# Patient Record
Sex: Female | Born: 1965
Health system: Southern US, Community
[De-identification: ages and names within clinical notes are randomized; demographics above are authoritative.]

## PROBLEM LIST (undated history)

## (undated) DIAGNOSIS — E785 Hyperlipidemia, unspecified: Secondary | ICD-10-CM

## (undated) DIAGNOSIS — I2510 Atherosclerotic heart disease of native coronary artery without angina pectoris: Secondary | ICD-10-CM

## (undated) DIAGNOSIS — K602 Anal fissure, unspecified: Secondary | ICD-10-CM

## (undated) DIAGNOSIS — I10 Essential (primary) hypertension: Secondary | ICD-10-CM

## (undated) DIAGNOSIS — D649 Anemia, unspecified: Secondary | ICD-10-CM

## (undated) HISTORY — DX: Anal fissure, unspecified: K60.2

## (undated) HISTORY — DX: Atherosclerotic heart disease of native coronary artery without angina pectoris: I25.10

## (undated) HISTORY — DX: Anemia, unspecified: D64.9

## (undated) HISTORY — PX: LEG SURGERY: SHX1003

---

## 2003-07-17 ENCOUNTER — Inpatient Hospital Stay (HOSPITAL_COMMUNITY): Admission: EM | Admit: 2003-07-17 | Discharge: 2003-07-19 | Payer: Self-pay | Admitting: Emergency Medicine

## 2003-08-30 ENCOUNTER — Encounter: Admission: RE | Admit: 2003-08-30 | Discharge: 2003-08-30 | Payer: Self-pay | Admitting: Orthopedic Surgery

## 2003-12-29 ENCOUNTER — Other Ambulatory Visit: Admission: RE | Admit: 2003-12-29 | Discharge: 2003-12-29 | Payer: Self-pay | Admitting: Obstetrics and Gynecology

## 2004-03-30 ENCOUNTER — Ambulatory Visit (HOSPITAL_COMMUNITY): Admission: RE | Admit: 2004-03-30 | Discharge: 2004-03-30 | Payer: Self-pay | Admitting: Orthopedic Surgery

## 2008-11-22 ENCOUNTER — Other Ambulatory Visit: Admission: RE | Admit: 2008-11-22 | Discharge: 2008-11-22 | Payer: Self-pay | Admitting: Family Medicine

## 2008-11-29 ENCOUNTER — Encounter: Admission: RE | Admit: 2008-11-29 | Discharge: 2008-11-29 | Payer: Self-pay | Admitting: Family Medicine
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
## 2010-08-25 NOTE — H&P (Signed)
NAME:  Robin, Walsh NO.:  [id number]   MEDICAL RECORD NO.:  [id number]          PATIENT TYPE:  OIB   LOCATION:  2550                         FACILITY:  [facility]   PHYSICIAN:  Myrtie Neither, MD      DATE OF BIRTH:  June 18, 1965   DATE OF ADMISSION:  03/30/2004  DATE OF DISCHARGE:  03/30/2004                                HISTORY & PHYSICAL   CHIEF COMPLAINT:  Painful hardware, left tibia.   HISTORY OF PRESENT ILLNESS:  This is a 45 year old female who sustained  tibial plateau fracture and underwent open reduction internal fixation with  a T-plate application for her tibial plateau fracture.  Patient's fracture  healed quite well.  Patient complained of bumping the screws up against  objects just palpable underneath the skin just below the knee.   PAST MEDICAL HISTORY:  Open reduction internal fixation, left tibia.  No  history of high blood pressure, diabetes.   ALLERGIES:  VICODIN, CODEINE AND PENICILLIN.   MEDICATIONS:  Arutis p.r.n.   SOCIAL HISTORY:  The patient smokes less than a pack per day times 15 years.  Denies use of alcohol.   FAMILY HISTORY:  Noncontributory.   REVIEW OF SYSTEMS:  That of history of present illness, no cardiac,  respiratory, no urinary or bowel symptoms.   PHYSICAL EXAMINATION:  GENERAL:  Alert and oriented, in no acute distress.  VITAL SIGNS:  Temperature 97.5, pulse 60, respiratory rate 16, blood  pressure 110/78, height 64 inches, weight 167.  HEENT:  Normocephalic.  Eye:  Conjunctiva, sclera clear.  Neck supple.  CHEST:  Clear.  CARDIAC:  S1, S2 regular.  EXTREMITIES:  Left lower leg old surgical scar with palpable screw, proximal  tibial plateau area laterally.  Range of motion of left knee is full, good  and stable.  No effusion.   IMPRESSION:  Painful hardware left tibial plateau.   PLAN:  Removal of hardware, left tibial plateau.    AC/MEDQ  D:  03/30/2004  T:  03/31/2004  Job:  [id number]

## 2010-08-25 NOTE — Op Note (Signed)
NAME:  Robin Walsh, Robin Walsh NO.:  [id number]   MEDICAL RECORD NO.:  [id number]                   PATIENT TYPE:  INP   LOCATION:  1829                                 FACILITY:  [facility]   PHYSICIAN:  Myrtie Neither, M.D.                 DATE OF BIRTH:  [birth year]   DATE OF PROCEDURE:  07/17/2003  DATE OF DISCHARGE:                                 OPERATIVE REPORT   PREOPERATIVE DIAGNOSIS:  Displaced lateral tibial plateau fracture.   POSTOPERATIVE DIAGNOSIS:  Displaced lateral tibial plateau fracture.   PROCEDURES:  Open reduction and internal fixation with buttress plate of  left lateral tibial plateau.   ANESTHESIA:  General.   SURGEON:  Myrtie Neither, M.D.   DESCRIPTION OF PROCEDURE:  The patient was taken to the operating room.  After being given adequate preoperative medications, she was given general  anesthesia and intubated.  The left lower extremity was prepped with  Duraprep and draped in a sterile manner.  Tourniquet used for hemostasis.  A  hockey stick incision was made over the left knee of the lateral compartment  going through the skin and subcutaneous tissue.  Sharp and blunt dissection  made down through the fascia.  The muscle was subperiosteally elevated from  the fracture site and the tibia.  Identification of the fracture site was  made.  Anterior wedge section of the plateau had been depressed and  displaced with the use of a freer.  The articular surface was pushed back up  and the fracture fragment itself was reduced.  This was held in place with a  K-wire.  Next a seven-hole buttress plate was placed, stabilizing the  fracture site with the use of six screws.  Anatomic reduction was possible.  Copious irrigation was then done followed by wound closure with use of 0  Vicryl for the fascia, 2-0 Vicryl for the subcutaneous tissues, Dermalon was  used for subcuticular, and closure of the skin with Steri-Strips.  A  compressive dressing  was applied.  Knee immobilizer applied.  The patient  tolerated the procedure quite well and went to recovery in satisfactory and  stable condition.                                               Myrtie Neither, M.D.    AC/MEDQ  D:  07/18/2003  T:  07/18/2003  Job:  [id number]

## 2010-08-25 NOTE — Op Note (Signed)
NAME:  Robin Walsh, Robin Walsh NO.:  [id number]   MEDICAL RECORD NO.:  [id number]          PATIENT TYPE:  OIB   LOCATION:  2550                         FACILITY:  [facility]   PHYSICIAN:  Myrtie Neither, MD      DATE OF BIRTH:  [birth year]   DATE OF PROCEDURE:  03/30/2004  DATE OF DISCHARGE:  03/30/2004                                 OPERATIVE REPORT   PREOPERATIVE DIAGNOSIS:  Painful hardware left tibia.   POSTOPERATIVE DIAGNOSIS:  Painful hardware left tibia.   OPERATION PERFORMED:  Removal of hardware, left tibia.   SURGEON:  Myrtie Neither, MD   ANESTHESIA:  General.   DESCRIPTION OF PROCEDURE:  The patient was taken to the operating room after  given adequate preoperative medications and given general anesthesia and  intubated.  The left lower leg was prepped with DuraPrep and draped in  sterile manner, tourniquet and Bovie used for hemostasis.  A curvilinear  incision was made through the previous scar going through the skin and  subcutaneous tissue, sharp and blunt dissection was down to the tibial  plate.  Six screws were removed and the plate was removed.  Irrigation was  then done.  Scar resection was done, followed by 2-0 Vicryl for the  subcutaneous tissue and #4-0 Dermalon suture for subcuticular closure.  Steri-Strips were applied.  Compressive dressing was applied.  Immobilizer  well.  The patient tolerated the procedure quite well and went to recovery  room in stable and satisfactory condition.      AC/MEDQ  D:  03/30/2004  T:  03/31/2004  Job:  [id number]

## 2015-11-26 ENCOUNTER — Emergency Department (HOSPITAL_COMMUNITY): Payer: Self-pay

## 2015-11-26 ENCOUNTER — Inpatient Hospital Stay (HOSPITAL_COMMUNITY)
Admission: EM | Admit: 2015-11-26 | Discharge: 2015-11-29 | DRG: 247 | Disposition: A | Discharge status: Home / Self Care | Payer: Self-pay | Attending: Internal Medicine | Admitting: Internal Medicine

## 2015-11-26 ENCOUNTER — Encounter (HOSPITAL_COMMUNITY): Payer: Self-pay

## 2015-11-26 DIAGNOSIS — I10 Essential (primary) hypertension: Secondary | ICD-10-CM | POA: Diagnosis present

## 2015-11-26 DIAGNOSIS — R079 Chest pain, unspecified: Secondary | ICD-10-CM

## 2015-11-26 DIAGNOSIS — Z91199 Patient's noncompliance with other medical treatment and regimen due to unspecified reason: Secondary | ICD-10-CM

## 2015-11-26 DIAGNOSIS — E781 Pure hyperglyceridemia: Secondary | ICD-10-CM | POA: Diagnosis present

## 2015-11-26 DIAGNOSIS — I25110 Atherosclerotic heart disease of native coronary artery with unstable angina pectoris: Principal | ICD-10-CM | POA: Diagnosis present

## 2015-11-26 DIAGNOSIS — F17200 Nicotine dependence, unspecified, uncomplicated: Secondary | ICD-10-CM | POA: Diagnosis present

## 2015-11-26 DIAGNOSIS — Z9861 Coronary angioplasty status: Secondary | ICD-10-CM

## 2015-11-26 DIAGNOSIS — I739 Peripheral vascular disease, unspecified: Secondary | ICD-10-CM | POA: Diagnosis present

## 2015-11-26 DIAGNOSIS — F17210 Nicotine dependence, cigarettes, uncomplicated: Secondary | ICD-10-CM | POA: Diagnosis present

## 2015-11-26 DIAGNOSIS — I200 Unstable angina: Secondary | ICD-10-CM | POA: Diagnosis present

## 2015-11-26 DIAGNOSIS — I2510 Atherosclerotic heart disease of native coronary artery without angina pectoris: Secondary | ICD-10-CM

## 2015-11-26 DIAGNOSIS — R9431 Abnormal electrocardiogram [ECG] [EKG]: Secondary | ICD-10-CM

## 2015-11-26 DIAGNOSIS — Z79899 Other long term (current) drug therapy: Secondary | ICD-10-CM

## 2015-11-26 DIAGNOSIS — M25551 Pain in right hip: Secondary | ICD-10-CM | POA: Diagnosis present

## 2015-11-26 DIAGNOSIS — E785 Hyperlipidemia, unspecified: Secondary | ICD-10-CM | POA: Diagnosis present

## 2015-11-26 DIAGNOSIS — R0789 Other chest pain: Secondary | ICD-10-CM

## 2015-11-26 DIAGNOSIS — Z9119 Patient's noncompliance with other medical treatment and regimen: Secondary | ICD-10-CM

## 2015-11-26 DIAGNOSIS — Z7951 Long term (current) use of inhaled steroids: Secondary | ICD-10-CM

## 2015-11-26 DIAGNOSIS — Z6827 Body mass index (BMI) 27.0-27.9, adult: Secondary | ICD-10-CM

## 2015-11-26 DIAGNOSIS — F1220 Cannabis dependence, uncomplicated: Secondary | ICD-10-CM | POA: Diagnosis present

## 2015-11-26 HISTORY — DX: Essential (primary) hypertension: I10

## 2015-11-26 HISTORY — DX: Hyperlipidemia, unspecified: E78.5

## 2015-11-26 LAB — BASIC METABOLIC PANEL
ANION GAP: 8 (ref 5–15)
BUN: <5 mg/dL — ABNORMAL LOW (ref 6–20)
CO2: 26 mmol/L (ref 22–32)
Calcium: 9.8 mg/dL (ref 8.9–10.3)
Chloride: 102 mmol/L (ref 101–111)
Creatinine, Ser: 0.99 mg/dL (ref 0.44–1.00)
GFR calc non Af Amer: >60 mL/min (ref >60)
Glucose, Bld: 105 mg/dL — ABNORMAL HIGH (ref 65–99)
Potassium: 3.6 mmol/L (ref 3.5–5.1)
Sodium: 136 mmol/L (ref 135–145)

## 2015-11-26 LAB — I-STAT TROPONIN, ED: Troponin i, poc: 0.03 ng/mL (ref 0.00–0.08)

## 2015-11-26 LAB — CBC
HCT: 43.5 % (ref 36.0–46.0)
Hemoglobin: 14.3 g/dL (ref 12.0–15.0)
MCH: 32.3 pg (ref 26.0–34.0)
MCHC: 32.9 g/dL (ref 30.0–36.0)
MCV: 98.2 fL (ref 78.0–100.0)
Platelets: 320 10*3/uL (ref 150–400)
RBC: 4.43 MIL/uL (ref 3.87–5.11)
RDW: 12.3 % (ref 11.5–15.5)
WBC: 10.2 10*3/uL (ref 4.0–10.5)

## 2015-11-26 NOTE — ED Notes (Signed)
Pt. Currently at radiology for chest x-ray

## 2015-11-26 NOTE — ED Provider Notes (Signed)
[facility] DEPT Provider Note   CSN: [id number] Arrival date & time: 11/26/15  2145     History   Chief Complaint Chief Complaint  Patient presents with  . Chest Pain    HPI Robin Walsh is a 50 y.o. female.  Robin Walsh is a 50 y.o. female  with a hx of HTN (unmedicated), smoking 1ppd presents to the Emergency Department complaining of intermittent chest pressure and burning.  Pt has associated jaw pain, bilateral arm pain, diaphoresis.  She reports that 1 month ago she had 1 full week of symptoms that occurred approx 2x per day.   She does not believe they were changed by exertion.  She reports ssx returned 3 days ago.  Her last full episode was at 10:30 this morning, but she has had some chest/back burning throughout the day. She denies a personal cardiac hx.  She reports a hx of CAD, CABG and CHF in her mother. Pt denies any previous cardiac work-up.  No known aggravating or alleviating symptoms.  She reports she is anxious about this.  Pt denies fever, chills, headache, abdominal pain, nausea, vomiting, diarrhea, weakness, dizziness, syncope.      The history is provided by the patient and medical records. No language interpreter was used.    Past Medical History:  Diagnosis Date  . Hypertension     Patient Active Problem List   Diagnosis Date Noted  . Chest pain 11/27/2015    Past Surgical History:  Procedure Laterality Date  . LEG SURGERY      OB History    No data available       Home Medications    Prior to Admission medications   Medication Sig Start Date End Date Taking? Authorizing Provider  albuterol (PROVENTIL HFA;VENTOLIN HFA) 108 (90 Base) MCG/ACT inhaler Inhale 1-2 puffs into the lungs every 6 (six) hours as needed for wheezing or shortness of breath.   Yes Historical Provider, MD  ibuprofen (ADVIL,MOTRIN) 200 MG tablet Take 200-400 mg by mouth every 6 (six) hours as needed for moderate pain.   Yes Historical Provider, MD    Family  History History reviewed. No pertinent family history.  Social History Social History  Substance Use Topics  . Smoking status: Current Every Day Smoker    Packs/day: 1.00    Types: Cigarettes  . Smokeless tobacco: Never Used  . Alcohol use No     Allergies   Codeine; Dilaudid [hydromorphone hcl]; and Penicillins   Review of Systems Review of Systems  Constitutional: Positive for diaphoresis.  Respiratory: Positive for chest tightness.   Cardiovascular: Positive for chest pain.  Gastrointestinal: Negative for abdominal pain, nausea and vomiting.  Skin: Negative for rash.  Neurological: Negative for syncope and numbness.  All other systems reviewed and are negative.    Physical Exam Updated Vital Signs BP 136/83   Pulse 78   Temp 97.9 F (36.6 C) (Oral)   Resp 21   Ht 5\' 4"  (1.626 m)   Wt 73.3 kg   LMP 08/26/2015   SpO2 96%   BMI 27.73 kg/m   Physical Exam  Constitutional: She appears well-developed and well-nourished. No distress.  Awake, alert, nontoxic appearance  HENT:  Head: Normocephalic and atraumatic.  Mouth/Throat: Oropharynx is clear and moist. No oropharyngeal exudate.  Eyes: Conjunctivae are normal. No scleral icterus.  Neck: Normal range of motion. Neck supple. No spinous process tenderness and no muscular tenderness present.  Cardiovascular: Normal rate, regular rhythm and intact distal  pulses.   Pulmonary/Chest: Effort normal and breath sounds normal. No respiratory distress. She has no wheezes.  Equal chest expansion  Abdominal: Soft. Bowel sounds are normal. She exhibits no mass. There is no tenderness. There is no rebound and no guarding.  Musculoskeletal: Normal range of motion. She exhibits no edema.  Neurological: She is alert.  Speech is clear and goal oriented Moves extremities without ataxia  Skin: Skin is warm and dry. She is not diaphoretic.  Psychiatric: Her mood appears anxious.  Nursing note and vitals reviewed.    ED  Treatments / Results  Labs (all labs ordered are listed, but only abnormal results are displayed) Labs Reviewed  BASIC METABOLIC PANEL - Abnormal; Notable for the following:       Result Value   Glucose, Bld 105 (*)    BUN <5 (*)    All other components within normal limits  CBC  I-STAT TROPOININ, ED    EKG  EKG Interpretation  Date/Time:  Saturday November 26 2015 22:09:40 EDT Ventricular Rate:  88 PR Interval:  132 QRS Duration: 76 QT Interval:  366 QTC Calculation: 442 R Axis:   60 Text Interpretation:  Normal sinus rhythm T wave abnormality, consider anterior ischemia Abnormal ECG No significant change since last tracing Abnormal ekg Confirmed by Carmin Muskrat  MD [phone]) on 11/26/2015 10:45:18 PM       Radiology Dg Chest 2 View  Result Date: 11/26/2015 CLINICAL DATA:  Burning sensation in the chest. EXAM: CHEST  2 VIEW COMPARISON:  03/28/2004 FINDINGS: Normal heart size and mediastinal contours. No infiltrate or edema. No effusion or pneumothorax. No acute osseous findings. IMPRESSION: Negative chest. Electronically Signed   By: Monte Fantasia M.D.   On: 11/26/2015 22:46    Procedures Procedures (including critical care time)  Medications Ordered in ED Medications - No data to display   Initial Impression / Assessment and Plan / ED Course  I have reviewed the triage vital signs and the nursing notes.  Pertinent labs & imaging results that were available during my care of the patient were reviewed by me and considered in my medical decision making (see chart for details).  Clinical Course  Value Comment By Time   Heart Score 4 Abigail Butts, PA-C 08/19 2306  DG Chest 2 View Neg [place], [place] 08/19 2307  ED EKG within 10 minutes T wave inversion I, avL, V1-V4 Abigail Butts, PA-C 08/19 2308  Troponin i, poc: 0.03 Neg Sariyah Corcino, PA-C 08/19 2309  BP: (!) 167/104 HTN - pt very anxious at the time Abigail Butts, PA-C 08/19 2309    Patient is pain-free at this time. Long discussion about risks versus benefit of leaving. Patient is willing to stay for further cardiac workup. Jarrett Soho Larue Lightner, PA-C 08/20 0021  WBC: 10.2 No leukocytosis or anemia Abigail Butts, PA-C 08/20 0021  Sodium: 136 Electrolytes reassuring Abigail Butts, PA-C 08/20 0021   Discussed with Dr. Lorin Mercy who will admit to tele obs Abigail Butts, PA-C 08/20 0103    Concern for cardiac etiology of Chest Pain. Pt has been re-evaluated prior to consult and VSS, NAD, heart RRR, pain 0/10, lungs CTAB. First round of cardiac enzymes negative. Pt has never had any provocative testing.  This case was discussed with Dr. Vanita Panda who has seen the patient and agrees with plan to admit.    Final Clinical Impressions(s) / ED Diagnoses   Final diagnoses:  Chest pain, unspecified chest pain type    New Prescriptions New  Prescriptions   No medications on file     Abigail Butts, PA-C 11/27/15 0104    Carmin Muskrat, MD 12/05/15 1002

## 2015-11-26 NOTE — ED Notes (Signed)
Dr. Vanita Panda at bedside evaluating pt. and explaining plan of care to pt.

## 2015-11-26 NOTE — ED Notes (Signed)
Per Charlett Nose, EMT Dr. Tyrone Nine wants repeat EKG in 10 minutes.

## 2015-11-26 NOTE — ED Triage Notes (Signed)
Onset 2-3 days burning sensation in between shoulder blades and sometimes radiates down both arms, chest has burning sensation.  Pt breaks out in hot sweat from head to toe.  Lasts 10 minutes x twice this morning.  Mild chest pressure since this morning.

## 2015-11-27 ENCOUNTER — Observation Stay (HOSPITAL_COMMUNITY): Payer: Self-pay

## 2015-11-27 ENCOUNTER — Encounter (HOSPITAL_COMMUNITY): Payer: Self-pay | Admitting: Internal Medicine

## 2015-11-27 DIAGNOSIS — I200 Unstable angina: Secondary | ICD-10-CM | POA: Diagnosis present

## 2015-11-27 DIAGNOSIS — I10 Essential (primary) hypertension: Secondary | ICD-10-CM | POA: Diagnosis present

## 2015-11-27 DIAGNOSIS — Z9119 Patient's noncompliance with other medical treatment and regimen: Secondary | ICD-10-CM

## 2015-11-27 DIAGNOSIS — F17200 Nicotine dependence, unspecified, uncomplicated: Secondary | ICD-10-CM

## 2015-11-27 DIAGNOSIS — F1220 Cannabis dependence, uncomplicated: Secondary | ICD-10-CM

## 2015-11-27 DIAGNOSIS — I214 Non-ST elevation (NSTEMI) myocardial infarction: Secondary | ICD-10-CM

## 2015-11-27 DIAGNOSIS — E785 Hyperlipidemia, unspecified: Secondary | ICD-10-CM

## 2015-11-27 DIAGNOSIS — M25551 Pain in right hip: Secondary | ICD-10-CM

## 2015-11-27 DIAGNOSIS — Z91199 Patient's noncompliance with other medical treatment and regimen due to unspecified reason: Secondary | ICD-10-CM

## 2015-11-27 DIAGNOSIS — R072 Precordial pain: Secondary | ICD-10-CM

## 2015-11-27 LAB — RAPID URINE DRUG SCREEN, HOSP PERFORMED
Amphetamines: NOT DETECTED
Barbiturates: NOT DETECTED
Benzodiazepines: NOT DETECTED
Cocaine: NOT DETECTED
OPIATES: NOT DETECTED
Tetrahydrocannabinol: POSITIVE — AB

## 2015-11-27 LAB — TSH: TSH: 2.32 u[IU]/mL (ref 0.350–4.500)

## 2015-11-27 LAB — PROTIME-INR
INR: 1.14
Prothrombin Time: 14.7 seconds (ref 11.4–15.2)

## 2015-11-27 LAB — TROPONIN I
TROPONIN I: 0.03 ng/mL — AB (ref <0.03)
Troponin I: 0.04 ng/mL (ref <0.03)
Troponin I: 0.04 ng/mL (ref <0.03)

## 2015-11-27 LAB — LIPID PANEL
CHOL/HDL RATIO: 7.8 ratio
Cholesterol: 218 mg/dL — ABNORMAL HIGH (ref 0–200)
HDL: 28 mg/dL — AB (ref >40)
LDL CALC: 124 mg/dL — AB (ref 0–99)
Triglycerides: 331 mg/dL — ABNORMAL HIGH (ref <150)
VLDL: 66 mg/dL — AB (ref 0–40)

## 2015-11-27 LAB — HEPARIN LEVEL (UNFRACTIONATED): Heparin Unfractionated: <0.1 IU/mL — ABNORMAL LOW (ref 0.30–0.70)

## 2015-11-27 MED ORDER — SODIUM CHLORIDE 0.9 % IV SOLN: 250.0000 mL | INTRAVENOUS | Status: DC | PRN

## 2015-11-27 MED ORDER — HEPARIN BOLUS VIA INFUSION
2000.0000 [IU] | Freq: Once | INTRAVENOUS | Status: AC
  Administered 2015-11-27: 2000 [IU] via INTRAVENOUS
  Filled 2015-11-27: qty 2000

## 2015-11-27 MED ORDER — ASPIRIN 81 MG PO CHEW
81.0000 mg | CHEWABLE_TABLET | ORAL | Status: AC
  Administered 2015-11-28: 81 mg via ORAL
  Filled 2015-11-27: qty 1

## 2015-11-27 MED ORDER — GI COCKTAIL ~~LOC~~: 30.0000 mL | Freq: Four times a day (QID) | ORAL | Status: DC | PRN

## 2015-11-27 MED ORDER — SODIUM CHLORIDE 0.9% FLUSH: 3.0000 mL | INTRAVENOUS | Status: DC | PRN

## 2015-11-27 MED ORDER — HEPARIN (PORCINE) IN NACL 100-0.45 UNIT/ML-% IJ SOLN
1250.0000 [IU]/h | INTRAMUSCULAR | Status: DC
  Administered 2015-11-27: 850 [IU]/h via INTRAVENOUS
  Administered 2015-11-28: 1250 [IU]/h via INTRAVENOUS
  Filled 2015-11-27 (×2): qty 250

## 2015-11-27 MED ORDER — ACETAMINOPHEN 325 MG PO TABS
650.0000 mg | ORAL_TABLET | ORAL | Status: DC | PRN
  Administered 2015-11-27: 650 mg via ORAL

## 2015-11-27 MED ORDER — LACTATED RINGERS IV SOLN
INTRAVENOUS | Status: DC
  Administered 2015-11-27 (×2): via INTRAVENOUS

## 2015-11-27 MED ORDER — ACETAMINOPHEN 325 MG PO TABS
ORAL_TABLET | ORAL | Status: AC
  Filled 2015-11-27: qty 2

## 2015-11-27 MED ORDER — REGADENOSON 0.4 MG/5ML IV SOLN
INTRAVENOUS | Status: AC
  Filled 2015-11-27: qty 5

## 2015-11-27 MED ORDER — ONDANSETRON HCL 4 MG/2ML IJ SOLN: 4.0000 mg | Freq: Four times a day (QID) | INTRAMUSCULAR | Status: DC | PRN

## 2015-11-27 MED ORDER — ASPIRIN EC 325 MG PO TBEC
325.0000 mg | DELAYED_RELEASE_TABLET | Freq: Every day | ORAL | Status: DC
  Administered 2015-11-27: 325 mg via ORAL
  Filled 2015-11-27 (×3): qty 1

## 2015-11-27 MED ORDER — SODIUM CHLORIDE 0.9 % WEIGHT BASED INFUSION: 1.0000 mL/kg/h | INTRAVENOUS | Status: DC

## 2015-11-27 MED ORDER — MORPHINE SULFATE (PF) 2 MG/ML IV SOLN: 2.0000 mg | INTRAVENOUS | Status: DC | PRN

## 2015-11-27 MED ORDER — HYDRALAZINE HCL 20 MG/ML IJ SOLN: 10.0000 mg | INTRAMUSCULAR | Status: DC | PRN

## 2015-11-27 MED ORDER — SODIUM CHLORIDE 0.9% FLUSH
3.0000 mL | Freq: Two times a day (BID) | INTRAVENOUS | Status: DC
  Administered 2015-11-27: 3 mL via INTRAVENOUS

## 2015-11-27 MED ORDER — ATORVASTATIN CALCIUM 40 MG PO TABS
40.0000 mg | ORAL_TABLET | Freq: Every day | ORAL | Status: DC
  Administered 2015-11-27 – 2015-11-28 (×2): 40 mg via ORAL
  Filled 2015-11-27 (×2): qty 1

## 2015-11-27 MED ORDER — HYDRALAZINE HCL 20 MG/ML IJ SOLN
10.0000 mg | INTRAMUSCULAR | Status: DC | PRN
  Administered 2015-11-27: 10 mg via INTRAVENOUS
  Filled 2015-11-27: qty 1

## 2015-11-27 MED ORDER — REGADENOSON 0.4 MG/5ML IV SOLN
0.4000 mg | Freq: Once | INTRAVENOUS | Status: DC
  Filled 2015-11-27: qty 5

## 2015-11-27 MED ORDER — ENOXAPARIN SODIUM 40 MG/0.4ML ~~LOC~~ SOLN
40.0000 mg | SUBCUTANEOUS | Status: DC
  Filled 2015-11-27: qty 0.4

## 2015-11-27 MED ORDER — METOPROLOL TARTRATE 25 MG PO TABS
25.0000 mg | ORAL_TABLET | Freq: Two times a day (BID) | ORAL | Status: DC
  Administered 2015-11-27 – 2015-11-28 (×4): 25 mg via ORAL
  Filled 2015-11-27 (×3): qty 1

## 2015-11-27 MED ORDER — ZOLPIDEM TARTRATE 5 MG PO TABS
5.0000 mg | ORAL_TABLET | Freq: Every evening | ORAL | Status: DC | PRN
  Administered 2015-11-27: 5 mg via ORAL
  Filled 2015-11-27: qty 1

## 2015-11-27 MED ORDER — TECHNETIUM TC 99M TETROFOSMIN IV KIT
10.0000 | PACK | Freq: Once | INTRAVENOUS | Status: AC | PRN
  Administered 2015-11-27: 10 via INTRAVENOUS

## 2015-11-27 MED ORDER — HEPARIN BOLUS VIA INFUSION
4000.0000 [IU] | Freq: Once | INTRAVENOUS | Status: AC
  Administered 2015-11-27: 4000 [IU] via INTRAVENOUS
  Filled 2015-11-27: qty 4000

## 2015-11-27 MED ORDER — CLONAZEPAM 0.5 MG PO TABS
0.5000 mg | ORAL_TABLET | Freq: Once | ORAL | Status: AC
Start: 2015-11-27 — End: 2015-11-27
  Administered 2015-11-27: 0.5 mg via ORAL
  Filled 2015-11-27: qty 1

## 2015-11-27 MED ORDER — SODIUM CHLORIDE 0.9 % WEIGHT BASED INFUSION
3.0000 mL/kg/h | INTRAVENOUS | Status: DC
  Administered 2015-11-28: 3 mL/kg/h via INTRAVENOUS

## 2015-11-27 NOTE — H&P (Signed)
History and Physical    Robin Walsh [id number] DOB: April 27, 1965 DOA: 11/26/2015  PCP: No primary care provider on file. Consultants:  None Patient coming from: home - lives with boyfriend; Donald Prose; Charisse March, [phone]  Chief Complaint:  Chest pain  HPI: Robin Walsh is a 50 y.o. female with medical history significant of HTN and HLD which are not treated because the patient has not seen a physician in years as well as tobacco dependence presenting with chest pain.  Patient reports that end of June had some chest pain for the first time.  Very intermittent.  Passed gas and would feel better.  ?GERD or something similar.  Had an episode at work last Thursday - burning on shoulder blade and checked blood pressure and diastolic was [id number].  2 episodes this AM.  Feels it coming on, burning between shoulder blades or in throat.  Improves with taking deep breath or opening up chest.  Sometimes radiates down one arm or the other.  Sometimes feels it in her jaw.  Feels tightness in her chest during episodes, "almost like a bronchitis tightness" but able to breathe fine.  Substernal.  Noin-exertional.  +diaphoretic.  No nausea.   Feels mild chest tightness with light sweat right now.     Has never had a stress test.  Has not sen a doctor in years, last mammogram 7 years ago and that was the last time she saw any doctor.  Was prescribed HCTZ 12.5 mg and a statin years ago.  Currently with hot flash.  Reports missing about 1 period every 1-2 years.  Has not had a period in the last few months.  Patient also describes difficulty walking distances due to R hip/groin pain.  She does not have this pain any other time, but when she walks distances it develops and it causes her to stop and rest.  The pain resolves completely with rest and then resumes when she starts walking again.   ED Course:  Per PA-C Muthersbaugh: Concern for cardiac etiology of Chest Pain. Pt has been re-evaluated prior to  consult and VSS, NAD, heart RRR, pain 0/10, lungs CTAB. First round of cardiac enzymes negative. Pt has never had any provocative testing.  This case was discussed with Dr. Vanita Panda who has seen the patient and agrees with plan to admit.   Review of Systems: As per HPI; otherwise 10 point review of systems reviewed and negative.   Ambulatory Status: ambulates without assistance  Past Medical History:  Diagnosis Date  . Hyperlipidemia   . Hypertension     Past Surgical History:  Procedure Laterality Date  . LEG SURGERY  2002?   tibial plateau fracture    Social History   Social History  . Marital status: Married    Spouse name: N/A  . Number of children: N/A  . Years of education: N/A   Occupational History  . clerical work    Social History Main Topics  . Smoking status: Current Every Day Smoker    Packs/day: 1.00    Years: 25.00    Types: Cigarettes  . Smokeless tobacco: Never Used  . Alcohol use No  . Drug use:     Types: Marijuana     Comment: nightly  . Sexual activity: Not on file   Other Topics Concern  . Not on file   Social History Narrative  . No narrative on file    Allergies  Allergen Reactions  . Codeine Nausea And Vomiting  .  Dilaudid [Hydromorphone Hcl] Nausea And Vomiting  . Penicillins Rash    Was told when she was a child that she was allergic.  Can take Amoxicillin.     Family History  Problem Relation Age of Onset  . Congestive Heart Failure Mother 24  . Alcohol abuse Father 71    Prior to Admission medications   Medication Sig Start Date End Date Taking? Authorizing Provider  albuterol (PROVENTIL HFA;VENTOLIN HFA) 108 (90 Base) MCG/ACT inhaler Inhale 1-2 puffs into the lungs every 6 (six) hours as needed for wheezing or shortness of breath.   Yes Historical Provider, MD  ibuprofen (ADVIL,MOTRIN) 200 MG tablet Take 200-400 mg by mouth every 6 (six) hours as needed for moderate pain.   Yes Historical Provider, MD    Physical  Exam: Vitals:   11/27/15 0130 11/27/15 0145 11/27/15 0200 11/27/15 0217  BP: 152/85 154/93 151/82 126/80  Pulse: 84 79 92 72  Resp: 13 15 23 18   Temp:      TempSrc:      SpO2: 99% 97% 98% 99%  Weight:      Height:         General: Appears calm and comfortable and is NAD; appears older than stated age Eyes:  PERRL, EOMI, normal lids, iris ENT:  grossly normal hearing, lips & tongue, mmm; poor dentition Neck:  no LAD, masses or thyromegaly Cardiovascular:  RRR, no m/r/g. No LE edema.  Respiratory:  CTA bilaterally, no w/r/r. Normal respiratory effort. Abdomen:  soft, ntnd, NABS Skin:  no rash or induration seen on limited exam Musculoskeletal:  grossly normal tone BUE/BLE, good ROM, no bony abnormality Psychiatric: mildly anxious, speech fluent and appropriate, AOx3 Neurologic:  CN 2-12 grossly intact, moves all extremities in coordinated fashion, sensation intact  Labs on Admission: I have personally reviewed following labs and imaging studies  CBC:  Recent Labs Lab 11/26/15 2247  WBC 10.2  HGB 14.3  HCT 43.5  MCV 98.2  PLT 99991111   Basic Metabolic Panel:  Recent Labs Lab 11/26/15 2247  NA 136  K 3.6  CL 102  CO2 26  GLUCOSE 105*  BUN <5*  CREATININE 0.99  CALCIUM 9.8   GFR: Estimated Creatinine Clearance: 68.1 mL/min (by C-G formula based on SCr of 0.99 mg/dL). Liver Function Tests: No results for input(s): AST, ALT, ALKPHOS, BILITOT, PROT, ALBUMIN in the last 168 hours. No results for input(s): LIPASE, AMYLASE in the last 168 hours. No results for input(s): AMMONIA in the last 168 hours. Coagulation Profile: No results for input(s): INR, PROTIME in the last 168 hours. Cardiac Enzymes: No results for input(s): CKTOTAL, CKMB, CKMBINDEX, TROPONINI in the last 168 hours. BNP (last 3 results) No results for input(s): PROBNP in the last 8760 hours. HbA1C: No results for input(s): HGBA1C in the last 72 hours. CBG: No results for input(s): GLUCAP in the last  168 hours. Lipid Profile: No results for input(s): CHOL, HDL, LDLCALC, TRIG, CHOLHDL, LDLDIRECT in the last 72 hours. Thyroid Function Tests: No results for input(s): TSH, T4TOTAL, FREET4, T3FREE, THYROIDAB in the last 72 hours. Anemia Panel: No results for input(s): VITAMINB12, FOLATE, FERRITIN, TIBC, IRON, RETICCTPCT in the last 72 hours. Urine analysis: No results found for: COLORURINE, APPEARANCEUR, LABSPEC, PHURINE, GLUCOSEU, HGBUR, BILIRUBINUR, KETONESUR, PROTEINUR, UROBILINOGEN, NITRITE, LEUKOCYTESUR  Creatinine Clearance: Estimated Creatinine Clearance: 68.1 mL/min (by C-G formula based on SCr of 0.99 mg/dL).  Sepsis Labs: @LABRCNTIP (procalcitonin:4,lacticidven:4) )No results found for this or any previous visit (from the past 240 hour(s)).  Radiological Exams on Admission: Dg Chest 2 View  Result Date: 11/26/2015 CLINICAL DATA:  Burning sensation in the chest. EXAM: CHEST  2 VIEW COMPARISON:  03/28/2004 FINDINGS: Normal heart size and mediastinal contours. No infiltrate or edema. No effusion or pneumothorax. No acute osseous findings. IMPRESSION: Negative chest. Electronically Signed   By: Monte Fantasia M.D.   On: 11/26/2015 22:46    EKG: Independently reviewed.  NSR with rate 92; nonspecific ST changes with no evidence of acute ischemia  Assessment/Plan Principal Problem:   Chest pain Active Problems:   Essential hypertension   Hyperlipidemia   Tobacco dependence   Noncompliance   Marijuana dependence ([place])   Right hip pain   Chest pain -Patient with vague chest pressure with radiation that is substernal but nonexertional and relieved spontaneously. -1/3 typical symptoms suggestive of atypical vs. Noncardiac chest pain.  -CXR unremarkable.   -Initial cardiac enzymes negative.   -EKG not indicative of acute ischemia.   -TIMI risk score is 2; which predicts a 14 days risk of death, recurrent MI, or urgent revascularization of 8.3%.  -Will plan to place in  observation status on telemetry to rule out ACS by overnight observation.  -cycle cardiac enzymes q6h x 3 and repeat EKG in AM -ASA 325 mg PO daily -morphine, statin given -Risk factor stratification with FLP and HgbA1c; will also check TSH -MPS in AM -Cardiology consultation in AM - unassigned notified via Inbox message to the [place] for discharge to home tomorrow after stress testing if tests are negative for ischemia  HTN -Patient with reported h/o HTN but has not seen a physician in years.   -BP is markedly abnormal here in ER -For now, will cover with prn IV Hydralazine -Will almost certainly need initiation of new medication(s) at the time of discharge  Tobacco Dependence -Encourage cessation.  This was discussed with the patient and should be reviewed on an ongoing basis.   -Patch declined by patient.  Marijuana Dependence -Encourage cessation. This was discussed with the patient and should be reviewed on an ongoing basis.    Right hip pain -While this pain may be MSK-related, her description of the pain was very consistent with intermittent claudication. -She may benefit from ABIs or other vascular testing as an outpatient.  Medical Non-Compliance -Long discussion with patient regarding her chronic medical problems and lack of follow-up/PCP. -She watched her mother die because of similar life choices and yet she seems to be choosing poorly. -Encouragement provided to start making better choices with regards to smoking, medications, physician follow-up. -Care management consult placed for assistance in locating a PCP.   DVT prophylaxis: Lovenox  Code Status:  Full - confirmed with patient/family Family Communication: None present  Disposition Plan:  Home once clinically improved Consults called: Cardiology, unassigned, via [employer] inbox message  Admission status: Observation, telemetry    Karmen Bongo MD [facility]  If 7PM-7AM, please  contact night-coverage [email or web address] Password [email or web address]  11/27/2015, 2:49 AM

## 2015-11-27 NOTE — Consult Note (Signed)
Cardiology Consult    Patient ID: Robin Walsh MRN: [id number], DOB/AGE: 04-15-1965   Admit date: 11/26/2015 Date of Consult: 11/27/2015  Primary Physician: No primary care provider on file. Reason for Consult: chest pain  Primary Cardiologist: new Requesting Provider: Dr. Allyson Sabal  Patient Profile  Robin Walsh is a 50 year old female with a past medical history of HTN, current tobacco abuse and HLD. She presented to the ED on 11/26/15 with 2-3 day history of a burning sensation between her shoulder blades that sometimes radiates down both arms.   History of Present Illness  Robin Walsh tells me that she began having a interscapular burning sensation that began while she was sitting at work about 2 months ago. She denies associated symptoms of SOB, nausea and diaphoresis. She does say that at times her interscapular pain will radiate to her throat and jaw.   Yesterday morning, she began having chest pain that awakened her from sleep. It was the same interscapular burning with associated throat pain and jaw pain. She did become diaphoretic.   She has not seen a doctor in many years, but does carry a diagnosis of hypertension and hyperlipidemia.   Her EKG shows deep anterior ST depression concerning for a LAD lesion. She has a family history of CAD, her mother had CABG at age 48 and recently passed from heart disease. Patient is a current every day smoker, and says that she eats a very unhealthy diet. Her initial troponin was 0.03, next was 0.04.   She is pain free at the time of my encounter, but is anxious and tearful about having to stay in the hospital.   Past Medical History   Past Medical History:  Diagnosis Date  . Hyperlipidemia   . Hypertension     Past Surgical History:  Procedure Laterality Date  . LEG SURGERY  2002?   tibial plateau fracture     Allergies  Allergies  Allergen Reactions  . Codeine Nausea And Vomiting  . Dilaudid [Hydromorphone Hcl] Nausea And  Vomiting  . Penicillins Rash    Was told when she was a child that she was allergic.  Can take Amoxicillin.     Inpatient Medications    . aspirin EC  325 mg Oral Daily  . atorvastatin  40 mg Oral q1800  . enoxaparin (LOVENOX) injection  40 mg Subcutaneous Q24H    Family History    Family History  Problem Relation Age of Onset  . Congestive Heart Failure Mother 37  . Alcohol abuse Father 25    Social History    Social History   Social History  . Marital status: Married    Spouse name: N/A  . Number of children: N/A  . Years of education: N/A   Occupational History  . clerical work    Social History Main Topics  . Smoking status: Current Every Day Smoker    Packs/day: 1.00    Years: 25.00    Types: Cigarettes  . Smokeless tobacco: Never Used  . Alcohol use No  . Drug use:     Types: Marijuana     Comment: nightly  . Sexual activity: Not on file   Other Topics Concern  . Not on file   Social History Narrative  . No narrative on file     Review of Systems    General:  No chills, fever, night sweats or weight changes.  Cardiovascular: +chest pain, + dyspnea on exertion, edema, orthopnea, palpitations, paroxysmal  nocturnal dyspnea. Dermatological: No rash, lesions/masses Respiratory: No cough, dyspnea Urologic: No hematuria, dysuria Abdominal:   No nausea, vomiting, diarrhea, bright red blood per rectum, melena, or hematemesis Neurologic:  No visual changes, wkns, changes in mental status. All other systems reviewed and are otherwise negative except as noted above.  Physical Exam    Blood pressure (!) 148/90, pulse 91, temperature 98 F (36.7 C), temperature source Oral, resp. rate 14, height 5\' 4"  (1.626 m), weight 157 lb 1.6 oz (71.3 kg), last menstrual period 08/26/2015, SpO2 100 %.  General: Pleasant, NAD Psych: Normal affect. Neuro: Alert and oriented X 3. Moves all extremities spontaneously. HEENT: Normal  Neck: Supple without bruits or  JVD. Lungs:  Resp regular and unlabored, CTA. Heart: RRR no s3, s4, or murmurs. Abdomen: Soft, non-tender, non-distended, BS + x 4.  Extremities: No clubbing, cyanosis or edema. DP/PT/Radials 2+ and equal bilaterally.  Labs    Troponin [facility] of Care Test)  Recent Labs  11/26/15 2234  TROPIPOC 0.03    Recent Labs  11/27/15 0453  TROPONINI 0.04*   Lab Results  Component Value Date   WBC 10.2 11/26/2015   HGB 14.3 11/26/2015   HCT 43.5 11/26/2015   MCV 98.2 11/26/2015   PLT 320 11/26/2015    Recent Labs Lab 11/26/15 2247  NA 136  K 3.6  CL 102  CO2 26  BUN <5*  CREATININE 0.99  CALCIUM 9.8  GLUCOSE 105*   Lab Results  Component Value Date   CHOL 218 (H) 11/27/2015   HDL 28 (L) 11/27/2015   LDLCALC 124 (H) 11/27/2015   TRIG 331 (H) 11/27/2015   No results found for: [facility]   Radiology Studies    Dg Chest 2 View  Result Date: 11/26/2015 CLINICAL DATA:  Burning sensation in the chest. EXAM: CHEST  2 VIEW COMPARISON:  03/28/2004 FINDINGS: Normal heart size and mediastinal contours. No infiltrate or edema. No effusion or pneumothorax. No acute osseous findings. IMPRESSION: Negative chest. Electronically Signed   By: Monte Fantasia M.D.   On: 11/26/2015 22:46    EKG & Cardiac Imaging    EKG: NSR, ST depression and T wave inversion in anterolateral leads  Echocardiogram: pending  Assessment & Plan  1. NSTEMI: Presents with interscapular burning with radiation to jaw and neck. This is likely her anginal equivalent as her EKG is concerning for ischemia. Her troponin is mildly elevated at 0.04. Will continue to cycle. Would place on heparin gtt, and stop Lovenox.  Can add Nitro gtt if she develops angina.   Dr. Marlou Porch discussed with patient the risks and benefits of heart catheterization. She was initially insistent that she leave the hospital "to take care of things at home" and return tonight. However, after further discussion with Dr. Marlou Porch about the risks  of MI and the severity of her situation, she agreed to stay. Her Lexiscan Myoview was cancelled.   2. HLD: LDL is 124, continue high intensity statin.   3. HTN: Normotensive to hypertensive. Would add 12.5 Metoprolol BID.   Signed, Arbutus Leas, NP 11/27/2015, 7:57 AM Pager: [phone]  Personally seen and examined. Agree with above.  50 year old female with NSTEMI, Trop 0.04, Wellen's waves on ECG marked TWI anterior leads, HL, HTN, family history with mother having bypass surgery at age 60, continued smoker, marijuana use.  Exam unremarkable, lungs are clear, heart regular. No murmurs.  Plan is for cardiac catheterization tomorrow or earlier if worsening symptoms develop. Originally, she wanted  to leave and come back. We had a lengthy discussion with her about the risks of doing this, especially in the setting of MI and possible proximal LAD disease. She seems willing to stay, quite anxious. She may require Xanax.  - Continue atorvastatin 40, aspirin  - Start metoprolol 25 mg twice a day  - Heparin IV  - Plan for heart catheterization tomorrow. Risks and benefits including stroke, heart attack, death, myocardial infarction worsening, bleeding and explained. Good radial pulse.  Candee Furbish, MD

## 2015-11-27 NOTE — Progress Notes (Signed)
Patient seen and examined  50 y.o. female with medical history significant of HTN and HLD which are not treated because the patient has not seen a physician in years as well as tobacco dependence presenting with chest pain. EKG noted to have changes in anterolateral leads, associated with abnormal troponin.  Assessment and plan Chest pain -Patient with vague chest pressure with radiation that is substernal but nonexertional and relieved spontaneously. -CXR unremarkable.  Cardiac enzymes 0.04 -EKG shows anterolateral ischemic changes, patient scheduled for stress test without cardiology notification Stress test terminated abruptly Continue atorvastatin 40, aspirin  - Start metoprolol 25 mg twice a day  - Heparin IV  - Plan for heart catheterization tomorrow  HTN -Patient with reported h/o HTN but has not seen a physician in years.   -BP is markedly abnormal here in ER -For now, will cover with prn IV Hydralazine, start low-dose beta blocker -Will almost certainly need initiation of new medication(s) at the time of discharge  Tobacco Dependence -Encourage cessation.  This was discussed with the patient and should be reviewed on an ongoing basis.   -Patch declined by patient.  Marijuana Dependence -Encourage cessation. This was discussed with the patient and should be reviewed on an ongoing basis.    Right hip pain  description of the pain was very consistent with intermittent claudication. Will need ABI   Medical Non-Compliance -Encouragement provided to start making better choices with regards to smoking, medications, physician follow-up. -Care management consult placed for assistance in locating a PCP.

## 2015-11-27 NOTE — ED Notes (Addendum)
Dr. Karmen Bongo ( admitting MD ) notified on pt.'s hypertension .

## 2015-11-27 NOTE — Progress Notes (Signed)
ANTICOAGULATION CONSULT NOTE - Initial Consult  Pharmacy Consult for heparin Indication: chest pain/ACS  Allergies  Allergen Reactions  . Codeine Nausea And Vomiting  . Dilaudid [Hydromorphone Hcl] Nausea And Vomiting  . Penicillins Rash    Was told when she was a child that she was allergic.  Can take Amoxicillin.     Patient Measurements: Height: 5\' 4"  (162.6 cm) Weight: 157 lb 1.6 oz (71.3 kg) IBW/kg (Calculated) : 54.7 Heparin Dosing Weight: 69.3 kg  Vital Signs: Temp: 98 F (36.7 C) (08/20 0447) Temp Source: Oral (08/20 0447) BP: 148/90 (08/20 0447) Pulse Rate: 91 (08/20 0447)  Labs:  Recent Labs  11/26/15 2247 11/27/15 0453  HGB 14.3  --   HCT 43.5  --   PLT 320  --   CREATININE 0.99  --   TROPONINI  --  0.04*    Estimated Creatinine Clearance: 67.3 mL/min (by C-G formula based on SCr of 0.99 mg/dL).   Medical History: Past Medical History:  Diagnosis Date  . Hyperlipidemia   . Hypertension     Assessment: 50yo F with PMH of HTN, current tobacco abuse, and HLD presents with chest pain. Pharmacy has been consulted to dose heparin. No anticoagulation PTA, CBC wnl, no bleeding documented  Goal of Therapy:  Heparin level 0.3-0.7 units/ml Monitor platelets by anticoagulation protocol: Yes   Plan:  Give 4000 units bolus x 1 Start heparin infusion at 850 units/hr Check anti-Xa level in 6 hours and daily while on heparin Continue to monitor H&H and platelets   Gwenlyn Perking, PharmD PGY1 Pharmacy Resident Pager: [phone] 11/27/2015 10:11 AM   I discussed / reviewed the pharmacy note by Dr. Karenann Cai and I agree with the resident's findings and plans as documented.  [employer], Pharm.D., BCPS Clinical Pharmacist Pager [phone] 11/27/2015 11:35 AM

## 2015-11-27 NOTE — Progress Notes (Signed)
ANTICOAGULATION CONSULT NOTE - Initial Consult  Pharmacy Consult for heparin Indication: chest pain/ACS  Allergies  Allergen Reactions  . Codeine Nausea And Vomiting  . Dilaudid [Hydromorphone Hcl] Nausea And Vomiting  . Penicillins Rash    Was told when she was a child that she was allergic.  Can take Amoxicillin.     Patient Measurements: Height: 5\' 4"  (162.6 cm) Weight: 157 lb 1.6 oz (71.3 kg) IBW/kg (Calculated) : 54.7 Heparin Dosing Weight: 69.3 kg  Vital Signs: Temp: 97.8 F (36.6 C) (08/20 1334) Temp Source: Axillary (08/20 1334) BP: 133/64 (08/20 1334) Pulse Rate: 77 (08/20 1334)  Labs:  Recent Labs  11/26/15 2247 11/27/15 0453 11/27/15 1025 11/27/15 1617  HGB 14.3  --   --   --   HCT 43.5  --   --   --   PLT 320  --   --   --   LABPROT  --   --   --  14.7  INR  --   --   --  1.14  HEPARINUNFRC  --   --   --  <0.10*  CREATININE 0.99  --   --   --   TROPONINI  --  0.04* 0.04* 0.03*    Estimated Creatinine Clearance: 67.3 mL/min (by C-G formula based on SCr of 0.99 mg/dL).   Medical History: Past Medical History:  Diagnosis Date  . Hyperlipidemia   . Hypertension     Assessment: 50yo F with PMH of HTN, current tobacco abuse, and HLD presents with chest pain. Pharmacy has been consulted to dose heparin. No anticoagulation PTA -Initial heparin level is undetectable  Goal of Therapy:  Heparin level 0.3-0.7 units/ml Monitor platelets by anticoagulation protocol: Yes   Plan:  HL< 0.1 -Heparin bolus 2000 units then increase to 1100/hr -Heparin level in 6 hours and daily wth CBC daily  Hildred Laser, Pharm D 11/27/2015 6:48 PM

## 2015-11-27 NOTE — ED Notes (Signed)
Transported to [facility] in stable condition with no chest pain / respirations unlabored .

## 2015-11-27 NOTE — Progress Notes (Signed)
Phone call from [place], [place] with [facility] procedure will be cancelled. Pt needs heart catheterization

## 2015-11-27 NOTE — Progress Notes (Signed)
Critical troponin I value reported by lab this AM - 0.04 ng/mL. AM EKG also showing possibly evolving, late-presenting inferior/posterior MI (likely RCA) with ST elevation of 0.5-1 mm in leads II, III and aVF with reciprocal changes in leads V1-V6 (T-wave inversions). This was also noted on both EKGs done prior in ED, but was not remarked-on as abnormally acute by EDP or admitting MD. No prior EKG tracings were available to compare.  Patient is currently resting comfortably; has not complained of chest pain or other symptoms since arrival to unit.  Last vital signs stable with mild diastolic hypertension present on admission at 04:47: Vitals:   11/27/15 0349 11/27/15 0400 11/27/15 0415 11/27/15 0447  BP: 138/84 128/93 130/83 (!) 148/90  Pulse: 96 98 92 91  Resp: 16 20 10 14   Temp:    98 F (36.7 C)  TempSrc:    Oral  SpO2: 98% 96% 98% 100%  Weight:    71.3 kg (157 lb 1.6 oz)  Height:    5\' 4"  (1.626 m)    Text-paged findings to NP on-call for [facility] to make aware; no new orders received at this time.  Patient is scheduled for lexiscan myoview this AM; have notified department will need to prioritize patient's study if possible.  Await cardiology input.  Continuing to monitor closely.

## 2015-11-27 NOTE — ED Notes (Signed)
Attempted to call report

## 2015-11-28 ENCOUNTER — Encounter (HOSPITAL_COMMUNITY)
Admission: EM | Disposition: A | Discharge status: Home / Self Care | Payer: Self-pay | Source: Home / Self Care | Attending: Internal Medicine

## 2015-11-28 DIAGNOSIS — R079 Chest pain, unspecified: Secondary | ICD-10-CM

## 2015-11-28 DIAGNOSIS — Z9861 Coronary angioplasty status: Secondary | ICD-10-CM

## 2015-11-28 DIAGNOSIS — I200 Unstable angina: Secondary | ICD-10-CM

## 2015-11-28 DIAGNOSIS — I2510 Atherosclerotic heart disease of native coronary artery without angina pectoris: Secondary | ICD-10-CM

## 2015-11-28 DIAGNOSIS — I25110 Atherosclerotic heart disease of native coronary artery with unstable angina pectoris: Principal | ICD-10-CM

## 2015-11-28 DIAGNOSIS — R9431 Abnormal electrocardiogram [ECG] [EKG]: Secondary | ICD-10-CM

## 2015-11-28 HISTORY — PX: CARDIAC CATHETERIZATION: SHX172

## 2015-11-28 LAB — CBC
HEMATOCRIT: 46.9 % — AB (ref 36.0–46.0)
HEMOGLOBIN: 15.1 g/dL — AB (ref 12.0–15.0)
MCH: 31.3 pg (ref 26.0–34.0)
MCHC: 32.2 g/dL (ref 30.0–36.0)
MCV: 97.1 fL (ref 78.0–100.0)
PLATELETS: 287 10*3/uL (ref 150–400)
RBC: 4.83 MIL/uL (ref 3.87–5.11)
RDW: 12.4 % (ref 11.5–15.5)
WBC: 10.1 10*3/uL (ref 4.0–10.5)

## 2015-11-28 LAB — HEMOGLOBIN A1C
HEMOGLOBIN A1C: 5.8 % — AB (ref 4.8–5.6)
MEAN PLASMA GLUCOSE: 120 mg/dL

## 2015-11-28 LAB — POCT ACTIVATED CLOTTING TIME: Activated Clotting Time: 340 seconds

## 2015-11-28 LAB — PREGNANCY, URINE: Preg Test, Ur: NEGATIVE

## 2015-11-28 LAB — HEPARIN LEVEL (UNFRACTIONATED)
HEPARIN UNFRACTIONATED: 0.23 [IU]/mL — AB (ref 0.30–0.70)
Heparin Unfractionated: 0.28 IU/mL — ABNORMAL LOW (ref 0.30–0.70)

## 2015-11-28 SURGERY — LEFT HEART CATH AND CORONARY ANGIOGRAPHY
Anesthesia: LOCAL

## 2015-11-28 MED ORDER — HEPARIN SODIUM (PORCINE) 1000 UNIT/ML IJ SOLN
INTRAMUSCULAR | Status: DC | PRN
Start: 2015-11-28 — End: 2015-11-28
  Administered 2015-11-28: 3500 [IU] via INTRAVENOUS
  Administered 2015-11-28: 4000 [IU] via INTRAVENOUS

## 2015-11-28 MED ORDER — SODIUM CHLORIDE 0.9 % WEIGHT BASED INFUSION: 1.0000 mL/kg/h | INTRAVENOUS | Status: AC

## 2015-11-28 MED ORDER — VERAPAMIL HCL 2.5 MG/ML IV SOLN
INTRAVENOUS | Status: DC | PRN
  Administered 2015-11-28: 10 mL via INTRA_ARTERIAL

## 2015-11-28 MED ORDER — LIDOCAINE HCL (PF) 1 % IJ SOLN
INTRAMUSCULAR | Status: DC | PRN
  Administered 2015-11-28: 3 mL

## 2015-11-28 MED ORDER — ACETAMINOPHEN 325 MG PO TABS
650.0000 mg | ORAL_TABLET | ORAL | Status: DC | PRN
  Administered 2015-11-28: 650 mg via ORAL
  Filled 2015-11-28: qty 2

## 2015-11-28 MED ORDER — ACTIVE PARTNERSHIP FOR HEALTH OF YOUR HEART BOOK
Freq: Once | Status: AC
  Administered 2015-11-28: 21:00:00
  Filled 2015-11-28: qty 1

## 2015-11-28 MED ORDER — VERAPAMIL HCL 2.5 MG/ML IV SOLN
INTRAVENOUS | Status: AC
  Filled 2015-11-28: qty 2

## 2015-11-28 MED ORDER — HEPARIN BOLUS VIA INFUSION
1000.0000 [IU] | Freq: Once | INTRAVENOUS | Status: AC
  Administered 2015-11-28: 1000 [IU] via INTRAVENOUS
  Filled 2015-11-28: qty 1000

## 2015-11-28 MED ORDER — TIROFIBAN HCL IN NACL 5-0.9 MG/100ML-% IV SOLN
INTRAVENOUS | Status: AC
  Filled 2015-11-28: qty 100

## 2015-11-28 MED ORDER — IOPAMIDOL (ISOVUE-370) INJECTION 76%
INTRAVENOUS | Status: DC | PRN
  Administered 2015-11-28: 95 mL via INTRA_ARTERIAL

## 2015-11-28 MED ORDER — MIDAZOLAM HCL 2 MG/2ML IJ SOLN
INTRAMUSCULAR | Status: AC
  Filled 2015-11-28: qty 2

## 2015-11-28 MED ORDER — MIDAZOLAM HCL 2 MG/2ML IJ SOLN
INTRAMUSCULAR | Status: DC | PRN
  Administered 2015-11-28 (×2): 1 mg via INTRAVENOUS
  Administered 2015-11-28: 2 mg via INTRAVENOUS
  Administered 2015-11-28: 1 mg via INTRAVENOUS

## 2015-11-28 MED ORDER — IOPAMIDOL (ISOVUE-370) INJECTION 76%
INTRAVENOUS | Status: AC
  Filled 2015-11-28: qty 50

## 2015-11-28 MED ORDER — TIROFIBAN HCL IN NACL 5-0.9 MG/100ML-% IV SOLN
0.1500 ug/kg/min | INTRAVENOUS | Status: AC
  Filled 2015-11-28: qty 100

## 2015-11-28 MED ORDER — HEPARIN (PORCINE) IN NACL 100-0.45 UNIT/ML-% IJ SOLN
1400.0000 [IU]/h | INTRAMUSCULAR | Status: DC
  Administered 2015-11-28: 1400 [IU]/h via INTRAVENOUS

## 2015-11-28 MED ORDER — CLOPIDOGREL BISULFATE 300 MG PO TABS
ORAL_TABLET | ORAL | Status: DC | PRN
  Administered 2015-11-28: 600 mg via ORAL

## 2015-11-28 MED ORDER — HEPARIN SODIUM (PORCINE) 1000 UNIT/ML IJ SOLN
INTRAMUSCULAR | Status: AC
  Filled 2015-11-28: qty 1

## 2015-11-28 MED ORDER — FENTANYL CITRATE (PF) 100 MCG/2ML IJ SOLN
INTRAMUSCULAR | Status: DC | PRN
  Administered 2015-11-28 (×4): 25 ug via INTRAVENOUS

## 2015-11-28 MED ORDER — VERAPAMIL HCL 2.5 MG/ML IV SOLN
INTRAVENOUS | Status: DC | PRN
  Administered 2015-11-28: 2 mg via INTRA_ARTERIAL

## 2015-11-28 MED ORDER — NITROGLYCERIN 1 MG/10 ML FOR IR/CATH LAB
INTRA_ARTERIAL | Status: AC
  Filled 2015-11-28: qty 10

## 2015-11-28 MED ORDER — CLOPIDOGREL BISULFATE 75 MG PO TABS
75.0000 mg | ORAL_TABLET | Freq: Every day | ORAL | Status: DC
  Administered 2015-11-29: 11:00:00 75 mg via ORAL
  Filled 2015-11-28: qty 1

## 2015-11-28 MED ORDER — ANGIOPLASTY BOOK
Freq: Once | Status: AC
  Administered 2015-11-28: 21:00:00
  Filled 2015-11-28: qty 1

## 2015-11-28 MED ORDER — CLOPIDOGREL BISULFATE 300 MG PO TABS
ORAL_TABLET | ORAL | Status: AC
  Filled 2015-11-28: qty 2

## 2015-11-28 MED ORDER — LIDOCAINE HCL (PF) 1 % IJ SOLN
INTRAMUSCULAR | Status: AC
  Filled 2015-11-28: qty 30

## 2015-11-28 MED ORDER — LORAZEPAM 2 MG/ML IJ SOLN
0.5000 mg | Freq: Once | INTRAMUSCULAR | Status: AC
Start: 2015-11-28 — End: 2015-11-28
  Administered 2015-11-28: 0.5 mg via INTRAVENOUS
  Filled 2015-11-28: qty 1

## 2015-11-28 MED ORDER — HEPARIN (PORCINE) IN NACL 2-0.9 UNIT/ML-% IJ SOLN
INTRAMUSCULAR | Status: AC
  Filled 2015-11-28: qty 1000

## 2015-11-28 MED ORDER — SODIUM CHLORIDE 0.9 % IV SOLN: 250.0000 mL | INTRAVENOUS | Status: DC | PRN

## 2015-11-28 MED ORDER — ONDANSETRON HCL 4 MG/2ML IJ SOLN: 4.0000 mg | Freq: Four times a day (QID) | INTRAMUSCULAR | Status: DC | PRN

## 2015-11-28 MED ORDER — FENTANYL CITRATE (PF) 100 MCG/2ML IJ SOLN
INTRAMUSCULAR | Status: AC
  Filled 2015-11-28: qty 2

## 2015-11-28 MED ORDER — HEPARIN (PORCINE) IN NACL 2-0.9 UNIT/ML-% IJ SOLN
INTRAMUSCULAR | Status: DC | PRN
  Administered 2015-11-28: 1500 mL

## 2015-11-28 MED ORDER — SODIUM CHLORIDE 0.9% FLUSH: 3.0000 mL | INTRAVENOUS | Status: DC | PRN

## 2015-11-28 MED ORDER — TIROFIBAN (AGGRASTAT) BOLUS VIA INFUSION
INTRAVENOUS | Status: DC | PRN
  Administered 2015-11-28: 1747.5 ug via INTRAVENOUS

## 2015-11-28 MED ORDER — SODIUM CHLORIDE 0.9% FLUSH: 3.0000 mL | Freq: Two times a day (BID) | INTRAVENOUS | Status: DC

## 2015-11-28 MED ORDER — TIROFIBAN HCL IN NACL 5-0.9 MG/100ML-% IV SOLN
INTRAVENOUS | Status: DC | PRN
  Administered 2015-11-28: 0.15 ug/kg/min via INTRAVENOUS

## 2015-11-28 MED ORDER — NITROGLYCERIN 1 MG/10 ML FOR IR/CATH LAB
INTRA_ARTERIAL | Status: DC | PRN
  Administered 2015-11-28 (×2): 200 ug via INTRA_ARTERIAL
  Administered 2015-11-28 (×2): 200 ug via INTRACORONARY

## 2015-11-28 MED ORDER — ASPIRIN 81 MG PO CHEW
81.0000 mg | CHEWABLE_TABLET | Freq: Every day | ORAL | Status: DC
  Administered 2015-11-29: 10:00:00 81 mg via ORAL
  Filled 2015-11-28: qty 1

## 2015-11-28 SURGICAL SUPPLY — 19 items
BALLN EMERGE MR 2.25X12 (BALLOONS) ×2
BALLN ~~LOC~~ EMERGE MR 3.25X8 (BALLOONS) ×2
BALLOON EMERGE MR 2.25X12 (BALLOONS) ×1 IMPLANT
BALLOON ~~LOC~~ EMERGE MR 3.25X8 (BALLOONS) ×1 IMPLANT
CATH INFINITI 5 FR JL3.5 (CATHETERS) ×2 IMPLANT
CATH INFINITI JR4 5F (CATHETERS) ×2 IMPLANT
CATH LAUNCHER 5F EBU3.0 (CATHETERS) ×1 IMPLANT
CATHETER LAUNCHER 5F EBU3.0 (CATHETERS) ×2
DEVICE RAD COMP TR BAND LRG (VASCULAR PRODUCTS) ×2 IMPLANT
GLIDESHEATH SLEND SS 6F .021 (SHEATH) ×2 IMPLANT
KIT ENCORE 26 ADVANTAGE (KITS) ×2 IMPLANT
KIT HEART LEFT (KITS) ×2 IMPLANT
PACK CARDIAC CATHETERIZATION (CUSTOM PROCEDURE TRAY) ×2 IMPLANT
STENT SYNERGY DES 3X12 (Permanent Stent) ×2 IMPLANT
TRANSDUCER W/STOPCOCK (MISCELLANEOUS) ×2 IMPLANT
TUBING CIL FLEX 10 FLL-RA (TUBING) ×2 IMPLANT
VALVE GUARDIAN II ~~LOC~~ HEMO (MISCELLANEOUS) ×2 IMPLANT
WIRE ASAHI PROWATER 180CM (WIRE) ×2 IMPLANT
WIRE SAFE-T 1.5MM-J .035X260CM (WIRE) ×2 IMPLANT

## 2015-11-28 NOTE — Plan of Care (Signed)
Problem: Phase II Progression Outcomes Goal: Cath/PCI Day Path if indicated Outcome: Progressing Reviewed heart cath printed information and told her how to view video, all questions answered and patient's family member had a heart cath before and gave her some information about his experience. I will speak with the patient privately after family leaves because she keeps getting distracted.

## 2015-11-28 NOTE — Interval H&P Note (Signed)
Cath Lab Visit (complete for each Cath Lab visit)  Clinical Evaluation Leading to the Procedure:   ACS: Yes.    Non-ACS:    Anginal Classification: CCS IV  Anti-ischemic medical therapy: Minimal Therapy (1 class of medications)  Non-Invasive Test Results: Equivocal test results  Prior CABG: No previous CABG   Abnormal resting images.  Abnormal ECG.   History and Physical Interval Note:  11/28/2015 5:33 PM  Robin Walsh  has presented today for surgery, with the diagnosis of unstable angina  The various methods of treatment have been discussed with the patient and family. After consideration of risks, benefits and other options for treatment, the patient has consented to  Procedure(s): Left Heart Cath and Coronary Angiography (N/A) as a surgical intervention .  The patient's history has been reviewed, patient examined, no change in status, stable for surgery.  I have reviewed the patient's chart and labs.  Questions were answered to the patient's satisfaction.     Robin Walsh

## 2015-11-28 NOTE — Progress Notes (Signed)
Report received in patient's room via Gwen RN using SBAR format, reviewed orders, labs, VS, meds and patient's general condition, assumed care of patient. 

## 2015-11-28 NOTE — Progress Notes (Signed)
[facility] PROGRESS NOTE  Robin Walsh [id number] DOB: 07-11-1965 DOA: 11/26/2015   PCP: No primary care provider on file.     Assessment/Plan: Principal Problem:   Chest pain Active Problems:   Essential hypertension   Hyperlipidemia   Tobacco dependence   Noncompliance   Marijuana dependence ([place])   Right hip pain   50 y.o.femalewith medical history significant of HTN and HLD which are not treated because the patient has not seen a physician in years as well as tobacco dependence presenting with chest pain. EKG noted to have changes in anterolateral leads, associated with abnormal troponin.  Assessment and plan Chest pain -Patient with vague chest pressure with radiation that is substernal but nonexertional and relieved spontaneously. -CXR unremarkable.  Cardiac enzymes are abnormal, -EKG shows anterolateral ischemic changes, patient scheduled for stress test without cardiology notification Stress test terminated abruptly Continue atorvastatin 40, aspirin Continue metoprolol 25 mg twice a day - Heparin IV, IV nitroglycerin - Plan for heart catheterization today   Hypertriglyceridemia-LDL 124, triglycerides 331, continue statin  HTN -Patient with reported h/o HTN but has not seen a physician in years.  -BP is markedly abnormal here in ER -For now, will cover with prn IV Hydralazine, start low-dose beta blocker   Tobacco Dependence -Encourage cessation. This was discussed with the patient and should be reviewed on an ongoing basis.  -Patch declined by patient.  Marijuana Dependence -Encourage cessation. This was discussed with the patient and should be reviewed on an ongoing basis.   Right hip pain  description of the pain was very consistent with intermittent claudication. Will need ABI   Medical Non-Compliance -Encouragement provided to start making better choices with regards to smoking, medications, physician  follow-up. -Care management consult placed for assistance in locating a PCP.  DVT prophylaxsis heparin drip  Code Status:  Full code    Family Communication: Discussed in detail with the patient, all imaging results, lab results explained to the patient   Disposition Plan:  Anticipate discharge in one to 2 days       Consultants:  Cardiology  Procedures:  None  Antibiotics: Anti-infectives    None         HPI/Subjective: Chest pain-free overnight  Objective: Vitals:   11/27/15 1334 11/27/15 2015 11/27/15 2144 11/28/15 0540  BP: 133/64 (!) 159/81 (!) 159/71 120/90  Pulse: 77 73 70 95  Resp:  14  16  Temp: 97.8 F (36.6 C) 98.2 F (36.8 C)  97.9 F (36.6 C)  TempSrc: Axillary Oral  Oral  SpO2: 98% 98%  100%  Weight:    69.9 kg (154 lb)  Height:        Intake/Output Summary (Last 24 hours) at 11/28/15 0815 Last data filed at 11/28/15 0600  Gross per 24 hour  Intake          2336.27 ml  Output              800 ml  Net          1536.27 ml    Exam:  Examination:  General exam: Appears calm and comfortable  Respiratory system: Clear to auscultation. Respiratory effort normal. Cardiovascular system: S1 & S2 heard, RRR. No JVD, murmurs, rubs, gallops or clicks. No pedal edema. Gastrointestinal system: Abdomen is nondistended, soft and nontender. No organomegaly or masses felt. Normal bowel sounds heard. Central nervous system: Alert and oriented. No focal neurological deficits. Extremities: Symmetric 5 x 5 power. Skin:  No rashes, lesions or ulcers Psychiatry: Judgement and insight appear normal. Mood & affect appropriate.     Data Reviewed: I have personally reviewed following labs and imaging studies  Micro Results No results found for this or any previous visit (from the past 240 hour(s)).  Radiology Reports Dg Chest 2 View  Result Date: 11/26/2015 CLINICAL DATA:  Burning sensation in the chest. EXAM: CHEST  2 VIEW COMPARISON:  03/28/2004  FINDINGS: Normal heart size and mediastinal contours. No infiltrate or edema. No effusion or pneumothorax. No acute osseous findings. IMPRESSION: Negative chest. Electronically Signed   By: Monte Fantasia M.D.   On: 11/26/2015 22:46   Nm Myocar Single W/spect W/wall Motion And Ef  Result Date: 11/27/2015 CLINICAL DATA:  Hypertension.  Chest pain. EXAM: MYOCARDIAL IMAGING WITH SPECT (REST) TECHNIQUE: Standard myocardial SPECT imaging was performed after resting intravenous injection of 10 Tc-42m tetrofosmin. COMPARISON:  Chest radiograph 11/16/2015 FINDINGS: Perfusion: There may be mildly decreased rest activity involving the mid to basilar segments of the septum. IMPRESSION: 1. Only the rest portion of the exam was performed. The study was halted after the rest images at clinical service request. 2. There may be mildly decreased activity involving the mid to basilar segments of the septum. This is nonspecific and of indeterminate significance given absence of stress imaging. Electronically Signed   By: Abigail Miyamoto M.D.   On: 11/27/2015 12:45     CBC  Recent Labs Lab 11/26/15 2247 11/28/15 0529  WBC 10.2 10.1  HGB 14.3 15.1*  HCT 43.5 46.9*  PLT 320 287  MCV 98.2 97.1  MCH 32.3 31.3  MCHC 32.9 32.2  RDW 12.3 12.4    Chemistries   Recent Labs Lab 11/26/15 2247  NA 136  K 3.6  CL 102  CO2 26  GLUCOSE 105*  BUN <5*  CREATININE 0.99  CALCIUM 9.8   ------------------------------------------------------------------------------------------------------------------ estimated creatinine clearance is 66.7 mL/min (by C-G formula based on SCr of 0.99 mg/dL). ------------------------------------------------------------------------------------------------------------------ No results for input(s): HGBA1C in the last 72 hours. ------------------------------------------------------------------------------------------------------------------  Recent Labs  11/27/15 0453  CHOL 218*  HDL  28*  LDLCALC 124*  TRIG 331*  CHOLHDL 7.8   ------------------------------------------------------------------------------------------------------------------  Recent Labs  11/27/15 0453  TSH 2.320   ------------------------------------------------------------------------------------------------------------------ No results for input(s): VITAMINB12, FOLATE, FERRITIN, TIBC, IRON, RETICCTPCT in the last 72 hours.  Coagulation profile  Recent Labs Lab 11/27/15 1617  INR 1.14    No results for input(s): DDIMER in the last 72 hours.  Cardiac Enzymes  Recent Labs Lab 11/27/15 0453 11/27/15 1025 11/27/15 1617  TROPONINI 0.04* 0.04* 0.03*   ------------------------------------------------------------------------------------------------------------------ Invalid input(s): POCBNP   CBG: No results for input(s): GLUCAP in the last 168 hours.     Studies: Dg Chest 2 View  Result Date: 11/26/2015 CLINICAL DATA:  Burning sensation in the chest. EXAM: CHEST  2 VIEW COMPARISON:  03/28/2004 FINDINGS: Normal heart size and mediastinal contours. No infiltrate or edema. No effusion or pneumothorax. No acute osseous findings. IMPRESSION: Negative chest. Electronically Signed   By: Monte Fantasia M.D.   On: 11/26/2015 22:46   Nm Myocar Single W/spect W/wall Motion And Ef  Result Date: 11/27/2015 CLINICAL DATA:  Hypertension.  Chest pain. EXAM: MYOCARDIAL IMAGING WITH SPECT (REST) TECHNIQUE: Standard myocardial SPECT imaging was performed after resting intravenous injection of 10 Tc-41m tetrofosmin. COMPARISON:  Chest radiograph 11/16/2015 FINDINGS: Perfusion: There may be mildly decreased rest activity involving the mid to basilar segments of the septum. IMPRESSION: 1. Only the rest  portion of the exam was performed. The study was halted after the rest images at clinical service request. 2. There may be mildly decreased activity involving the mid to basilar segments of the septum. This  is nonspecific and of indeterminate significance given absence of stress imaging. Electronically Signed   By: Abigail Miyamoto M.D.   On: 11/27/2015 12:45      No results found for: HGBA1C Lab Results  Component Value Date   LDLCALC 124 (H) 11/27/2015   CREATININE 0.99 11/26/2015       Scheduled Meds: . aspirin EC  325 mg Oral Daily  . atorvastatin  40 mg Oral q1800  . metoprolol tartrate  25 mg Oral BID  . regadenoson  0.4 mg Intravenous Once  . sodium chloride flush  3 mL Intravenous Q12H   Continuous Infusions: . sodium chloride 1 mL/kg/hr (11/28/15 0640)  . heparin 1,250 Units/hr (11/28/15 0453)  . lactated ringers 75 mL/hr at 11/27/15 1936     LOS: 1 day    Time spent: >30 MINS    [facility] Pager [phone]. If 7PM-7AM, please contact night-coverage at [email or web address], password [facility] 11/28/2015, 8:15 AM  LOS: 1 day

## 2015-11-28 NOTE — Progress Notes (Signed)
ANTICOAGULATION CONSULT NOTE - Follow Up Consult  Pharmacy Consult for Heparin  Indication: chest pain/ACS  Allergies  Allergen Reactions  . Codeine Nausea And Vomiting  . Dilaudid [Hydromorphone Hcl] Nausea And Vomiting  . Penicillins Rash    Was told when she was a child that she was allergic.  Can take Amoxicillin.     Patient Measurements: Height: 5\' 4"  (162.6 cm) Weight: 157 lb 1.6 oz (71.3 kg) IBW/kg (Calculated) : 54.7  Vital Signs: Temp: 98.2 F (36.8 C) (08/20 2015) Temp Source: Oral (08/20 2015) BP: 159/71 (08/20 2144) Pulse Rate: 70 (08/20 2144)  Labs:  Recent Labs  11/26/15 2247 11/27/15 0453 11/27/15 1025 11/27/15 1617 11/28/15 0123  HGB 14.3  --   --   --   --   HCT 43.5  --   --   --   --   PLT 320  --   --   --   --   LABPROT  --   --   --  14.7  --   INR  --   --   --  1.14  --   HEPARINUNFRC  --   --   --  <0.10* 0.23*  CREATININE 0.99  --   --   --   --   TROPONINI  --  0.04* 0.04* 0.03*  --     Estimated Creatinine Clearance: 67.3 mL/min (by C-G formula based on SCr of 0.99 mg/dL).   Assessment: Heparin for CP, anti-Xa remains low   Goal of Therapy:  Heparin level 0.3-0.7 units/ml Monitor platelets by anticoagulation protocol: Yes   Plan:  -Inc heparin to 1250 units/hr -1000 HL  Donalyn Schneeberger 11/28/2015,1:45 AM

## 2015-11-28 NOTE — Progress Notes (Addendum)
Called cath lab and PA on call to find out when patient might go for cath. PA stated to keep pt NPO for probable cath today. Patient updated. Will cont to monitor pt.

## 2015-11-28 NOTE — Progress Notes (Signed)
Cath lab called to see when pt would go for cath. Pt updated. Will cont to monitor pt.

## 2015-11-28 NOTE — Progress Notes (Signed)
Patient Name: Robin Walsh Date of Encounter: 11/28/2015  Hospital Problem List     Principal Problem:   Unstable angina [facility]) Active Problems:   Tobacco dependence   Abnormal finding on EKG - anterior T-wave inversions concerning for LAD ischemia   Hyperlipidemia   Essential hypertension   Noncompliance   Marijuana dependence ([place])   Right hip pain    Patient Summary     50 year old woman with history of hypertension, chronic tobacco abuse as well as marijuana abuse and hyperlipidemia. She presented with 2 days of worsening him chest/back pain between her shoulder blades. She had mild elevation in her troponin levels, and has significant anterior T-wave inversions/ST depression in EKG concerning for an LAD lesion.  She was seen and evaluated by Dr. Marlou Porch yesterday in consultation and recommended cardiac catheterization.  Subjective   No further chest pain here. When I went to see her she had just come from being outside. She is getting very anxious and agitated about having to be cooped up in her room. She much having signs of tobacco withdrawal. Susy Frizzle was to go downstairs to "have a drag ", then put it out.  She did not have any chest tightness pressure or walking down stairs or back to her room.  Inpatient Medications    . aspirin EC  325 mg Oral Daily  . atorvastatin  40 mg Oral q1800  . metoprolol tartrate  25 mg Oral BID  . regadenoson  0.4 mg Intravenous Once  . sodium chloride flush  3 mL Intravenous Q12H    Vital Signs    Vitals:   11/27/15 2015 11/27/15 2144 11/28/15 0540 11/28/15 1035  BP: (!) 159/81 (!) 159/71 120/90 (!) 168/95  Pulse: 73 70 95   Resp: 14  16   Temp: 98.2 F (36.8 C)  97.9 F (36.6 C)   TempSrc: Oral  Oral   SpO2: 98%  100%   Weight:   154 lb (69.9 kg)   Height:        Intake/Output Summary (Last 24 hours) at 11/28/15 1343 Last data filed at 11/28/15 1254  Gross per 24 hour  Intake          2336.27 ml  Output              1100 ml  Net          1236.27 ml   Filed Weights   11/26/15 2153 11/27/15 0447 11/28/15 0540  Weight: 161 lb 9 oz (73.3 kg) 157 lb 1.6 oz (71.3 kg) 154 lb (69.9 kg)    Physical Exam    GEN: Well nourished, well developed, in no acute distress. Smells like cigarettes.  HEENT: normal.  Neck: Supple, no JVD, carotid bruits, or masses. Cardiac: RRR, no murmurs, rubs, or gallops. No clubbing, cyanosis, edema.  Radials/DP/PT 2+ and equal bilaterally.  Respiratory:  Respirations regular and unlabored, clear to auscultation bilaterally. GI: Soft, nontender, nondistended, BS + x 4. Neuro:  Strength and sensation are intact. Psych: Normal affect.She is very anxious and frustrated   Labs    CBC  Recent Labs  11/26/15 2247 11/28/15 0529  WBC 10.2 10.1  HGB 14.3 15.1*  HCT 43.5 46.9*  MCV 98.2 97.1  PLT 320 [id number]   Basic Metabolic Panel  Recent Labs  11/26/15 2247  NA 136  K 3.6  CL 102  CO2 26  GLUCOSE 105*  BUN <5*  CREATININE 0.99  CALCIUM 9.8   Liver Function Tests  No results for input(s): AST, ALT, ALKPHOS, BILITOT, PROT, ALBUMIN in the last 72 hours. No results for input(s): LIPASE, AMYLASE in the last 72 hours. Cardiac Enzymes  Recent Labs  11/27/15 0453 11/27/15 1025 11/27/15 1617  TROPONINI 0.04* 0.04* 0.03*   BNP Invalid input(s): POCBNP D-Dimer No results for input(s): DDIMER in the last 72 hours. Hemoglobin A1C  Recent Labs  11/27/15 0453  HGBA1C 5.8*   Fasting Lipid Panel  Recent Labs  11/27/15 0453  CHOL 218*  HDL 28*  LDLCALC 124*  TRIG 331*  CHOLHDL 7.8   Thyroid Function Tests  Recent Labs  11/27/15 0453  TSH 2.320    Telemetry    Sinus rhythm 70s to 90s  ECG    Sinus rhythm, rate 91. Persistent mild ST depressions with T-wave inversions in V1 and V2. Less pronounced from an ischemia standpoint than prior EKG. =-- The dramatic T-wave inversions in V2 and V3 were no longer present.  Radiology    Dg Chest 2  View  Result Date: 11/26/2015 CLINICAL DATA:  Burning sensation in the chest. EXAM: CHEST  2 VIEW COMPARISON:  03/28/2004 FINDINGS: Normal heart size and mediastinal contours. No infiltrate or edema. No effusion or pneumothorax. No acute osseous findings. IMPRESSION: Negative chest. Electronically Signed   By: Monte Fantasia M.D.   On: 11/26/2015 22:46   Nm Myocar Single W/spect W/wall Motion And Ef  Result Date: 11/27/2015 CLINICAL DATA:  Hypertension.  Chest pain. EXAM: MYOCARDIAL IMAGING WITH SPECT (REST) TECHNIQUE: Standard myocardial SPECT imaging was performed after resting intravenous injection of 10 Tc-36m tetrofosmin. COMPARISON:  Chest radiograph 11/16/2015 FINDINGS: Perfusion: There may be mildly decreased rest activity involving the mid to basilar segments of the septum. IMPRESSION: 1. Only the rest portion of the exam was performed. The study was halted after the rest images at clinical service request. 2. There may be mildly decreased activity involving the mid to basilar segments of the septum. This is nonspecific and of indeterminate significance given absence of stress imaging. Electronically Signed   By: Abigail Miyamoto M.D.   On: 11/27/2015 12:45    Assessment & Plan    Principal Problem:   Unstable angina (HCC)Abnormal finding on EKG - anterior T-wave inversions concerning for LAD ischemia  She actually had low levels of troponin, would not do so to consider this to be non-STEMI, however it could be acute coronary syndrome. She noted intrascapular burning with radiation to her jaw and neck. Her initial EKG while she was having symptoms was significant for potential anterior ischemia. Now improved.  Continued on IV heparin drip.  Lexiscan Myoview was canceled with plans to consider cardiac catheterization.  The patient is very anxious and upset that she has not yet had a heart catheterization today. She simply wants to go outside and smoke a cigarette and she doesn't want the  whole cigarette. She does not know that she could wait any longer. She asked about potentially going home and returning for a outpatient cardiac catheterization.  Is mildly difficult is better off staying in the hospital for cath, but as her EKG has improved today, we could potentially consider scheduling outpatient catheterization provided she understands the risks that are involved with discharge. She would need to be on long-acting nitrate, beta blocker and statin.  Active Problems:   Tobacco dependence - major issue. She has patch on, but is still desiring the taste of the cigarette. Unfortunately, I did explain to her that is counterintuitive to allow her  to go smoke a cigarette when were thinking that this is what the cause of her coronary disease.      Hyperlipidemia - on high-dose statin   Essential hypertension -- not very well controlled today, but she is quite upset.   She was started on low-dose Lopressor yesterday. I suspect that she could probably tolerate higher dose, but this was just increased yesterday. Would await a few more doses prior to changing.   Noncompliance - very concerning for her potential return if she were to be discharged with plan for outpatient cath.   Marijuana dependence ([place])   At this point, she seems to be somewhat more relaxed than when I first started talking to her. We are going to try to see if we can potentially alleviate some of the cath lab schedule.  Unfortunately, the abdominis was too long and several patients that potentially could've had a light breakfast did not have that ordered. At this point, she is probably too close to actually going to the Cath Lab to eat.  I will discuss with interventional colleagues to determine if it would be acceptable recommendation to allow her to be discharged and plan outpatient catheterization. But for now would prefer to consider inpatient.   Signed, Glenetta Hew, M.D., M.S. Interventional Cardiologist    Pager # [phone] Phone # [phone] [address]. [place], [place] [address]

## 2015-11-28 NOTE — Progress Notes (Signed)
Cath lab called to see when pt would be going for cath, Cath lab stated she would be later this afternoon. Pt updated and Pt stated she was upset. MD came by to update patient. Will cont to monitor pt.

## 2015-11-28 NOTE — Progress Notes (Signed)
ANTICOAGULATION CONSULT NOTE - Follow Up Consult  Pharmacy Consult for Heparin  Indication: chest pain/ACS  Allergies  Allergen Reactions  . Codeine Nausea And Vomiting  . Dilaudid [Hydromorphone Hcl] Nausea And Vomiting  . Penicillins Rash    Was told when she was a child that she was allergic.  Can take Amoxicillin.     Patient Measurements: Height: 5\' 4"  (162.6 cm) Weight: 154 lb (69.9 kg) IBW/kg (Calculated) : 54.7  Vital Signs: Temp: 97.9 F (36.6 C) (08/21 0540) Temp Source: Oral (08/21 0540) BP: 120/90 (08/21 0540) Pulse Rate: 95 (08/21 0540)  Labs:  Recent Labs  11/26/15 2247 11/27/15 0453 11/27/15 1025 11/27/15 1617 11/28/15 0123 11/28/15 0529 11/28/15 0922  HGB 14.3  --   --   --   --  15.1*  --   HCT 43.5  --   --   --   --  46.9*  --   PLT 320  --   --   --   --  287  --   LABPROT  --   --   --  14.7  --   --   --   INR  --   --   --  1.14  --   --   --   HEPARINUNFRC  --   --   --  <0.10* 0.23*  --  0.28*  CREATININE 0.99  --   --   --   --   --   --   TROPONINI  --  0.04* 0.04* 0.03*  --   --   --     Estimated Creatinine Clearance: 66.7 mL/min (by C-G formula based on SCr of 0.99 mg/dL).   Assessment: Heparin IV infusing at 1250 units/hr for CP, anti-Xa level increased slightly, 0.28 but remains below goal.  For possible cardiac cath today depending on cath schedule.  CBC stable. No bleeding reported.   Goal of Therapy:  Heparin level 0.3-0.7 units/ml Monitor platelets by anticoagulation protocol: Yes   Plan:  Give heparin bolus 1000 units IV x1 Increase heparin drip to 1400 units/hr Check heparin level in 6 hours unless patient goes to cardiac cath lab.    Thank you for allowing pharmacy to be part of this patients care team. Nicole Cella, RPh Clinical Pharmacist Pager: [phone] 11/28/2015,10:36 AM

## 2015-11-28 NOTE — Progress Notes (Signed)
TR BAND REMOVAL  LOCATION:    right radial  DEFLATED PER PROTOCOL:    Yes.    TIME BAND OFF / DRESSING APPLIED:    22:30   SITE UPON ARRIVAL:    Level 0  SITE AFTER BAND REMOVAL:    Level 0  CIRCULATION SENSATION AND MOVEMENT:    Within Normal Limits   Yes.    COMMENTS:   Post TR band instructions given. Pt tolerated well. 

## 2015-11-28 NOTE — H&P (View-Only) (Signed)
Patient Name: Robin Walsh Date of Encounter: 11/28/2015  Hospital Problem List     Principal Problem:   Unstable angina [facility]) Active Problems:   Tobacco dependence   Abnormal finding on EKG - anterior T-wave inversions concerning for LAD ischemia   Hyperlipidemia   Essential hypertension   Noncompliance   Marijuana dependence ([place])   Right hip pain    Patient Summary     50 year old woman with history of hypertension, chronic tobacco abuse as well as marijuana abuse and hyperlipidemia. She presented with 2 days of worsening him chest/back pain between her shoulder blades. She had mild elevation in her troponin levels, and has significant anterior T-wave inversions/ST depression in EKG concerning for an LAD lesion.  She was seen and evaluated by Dr. Marlou Porch yesterday in consultation and recommended cardiac catheterization.  Subjective   No further chest pain here. When I went to see her she had just come from being outside. She is getting very anxious and agitated about having to be cooped up in her room. She much having signs of tobacco withdrawal. Susy Frizzle was to go downstairs to "have a drag ", then put it out.  She did not have any chest tightness pressure or walking down stairs or back to her room.  Inpatient Medications    . aspirin EC  325 mg Oral Daily  . atorvastatin  40 mg Oral q1800  . metoprolol tartrate  25 mg Oral BID  . regadenoson  0.4 mg Intravenous Once  . sodium chloride flush  3 mL Intravenous Q12H    Vital Signs    Vitals:   11/27/15 2015 11/27/15 2144 11/28/15 0540 11/28/15 1035  BP: (!) 159/81 (!) 159/71 120/90 (!) 168/95  Pulse: 73 70 95   Resp: 14  16   Temp: 98.2 F (36.8 C)  97.9 F (36.6 C)   TempSrc: Oral  Oral   SpO2: 98%  100%   Weight:   154 lb (69.9 kg)   Height:        Intake/Output Summary (Last 24 hours) at 11/28/15 1343 Last data filed at 11/28/15 1254  Gross per 24 hour  Intake          2336.27 ml  Output              1100 ml  Net          1236.27 ml   Filed Weights   11/26/15 2153 11/27/15 0447 11/28/15 0540  Weight: 161 lb 9 oz (73.3 kg) 157 lb 1.6 oz (71.3 kg) 154 lb (69.9 kg)    Physical Exam    GEN: Well nourished, well developed, in no acute distress. Smells like cigarettes.  HEENT: normal.  Neck: Supple, no JVD, carotid bruits, or masses. Cardiac: RRR, no murmurs, rubs, or gallops. No clubbing, cyanosis, edema.  Radials/DP/PT 2+ and equal bilaterally.  Respiratory:  Respirations regular and unlabored, clear to auscultation bilaterally. GI: Soft, nontender, nondistended, BS + x 4. Neuro:  Strength and sensation are intact. Psych: Normal affect.She is very anxious and frustrated   Labs    CBC  Recent Labs  11/26/15 2247 11/28/15 0529  WBC 10.2 10.1  HGB 14.3 15.1*  HCT 43.5 46.9*  MCV 98.2 97.1  PLT 320 [id number]   Basic Metabolic Panel  Recent Labs  11/26/15 2247  NA 136  K 3.6  CL 102  CO2 26  GLUCOSE 105*  BUN <5*  CREATININE 0.99  CALCIUM 9.8   Liver Function Tests  No results for input(s): AST, ALT, ALKPHOS, BILITOT, PROT, ALBUMIN in the last 72 hours. No results for input(s): LIPASE, AMYLASE in the last 72 hours. Cardiac Enzymes  Recent Labs  11/27/15 0453 11/27/15 1025 11/27/15 1617  TROPONINI 0.04* 0.04* 0.03*   BNP Invalid input(s): POCBNP D-Dimer No results for input(s): DDIMER in the last 72 hours. Hemoglobin A1C  Recent Labs  11/27/15 0453  HGBA1C 5.8*   Fasting Lipid Panel  Recent Labs  11/27/15 0453  CHOL 218*  HDL 28*  LDLCALC 124*  TRIG 331*  CHOLHDL 7.8   Thyroid Function Tests  Recent Labs  11/27/15 0453  TSH 2.320    Telemetry    Sinus rhythm 70s to 90s  ECG    Sinus rhythm, rate 91. Persistent mild ST depressions with T-wave inversions in V1 and V2. Less pronounced from an ischemia standpoint than prior EKG. =-- The dramatic T-wave inversions in V2 and V3 were no longer present.  Radiology    Dg Chest 2  View  Result Date: 11/26/2015 CLINICAL DATA:  Burning sensation in the chest. EXAM: CHEST  2 VIEW COMPARISON:  03/28/2004 FINDINGS: Normal heart size and mediastinal contours. No infiltrate or edema. No effusion or pneumothorax. No acute osseous findings. IMPRESSION: Negative chest. Electronically Signed   By: Monte Fantasia M.D.   On: 11/26/2015 22:46   Nm Myocar Single W/spect W/wall Motion And Ef  Result Date: 11/27/2015 CLINICAL DATA:  Hypertension.  Chest pain. EXAM: MYOCARDIAL IMAGING WITH SPECT (REST) TECHNIQUE: Standard myocardial SPECT imaging was performed after resting intravenous injection of 10 Tc-37m tetrofosmin. COMPARISON:  Chest radiograph 11/16/2015 FINDINGS: Perfusion: There may be mildly decreased rest activity involving the mid to basilar segments of the septum. IMPRESSION: 1. Only the rest portion of the exam was performed. The study was halted after the rest images at clinical service request. 2. There may be mildly decreased activity involving the mid to basilar segments of the septum. This is nonspecific and of indeterminate significance given absence of stress imaging. Electronically Signed   By: Abigail Miyamoto M.D.   On: 11/27/2015 12:45    Assessment & Plan    Principal Problem:   Unstable angina (HCC)Abnormal finding on EKG - anterior T-wave inversions concerning for LAD ischemia  She actually had low levels of troponin, would not do so to consider this to be non-STEMI, however it could be acute coronary syndrome. She noted intrascapular burning with radiation to her jaw and neck. Her initial EKG while she was having symptoms was significant for potential anterior ischemia. Now improved.  Continued on IV heparin drip.  Lexiscan Myoview was canceled with plans to consider cardiac catheterization.  The patient is very anxious and upset that she has not yet had a heart catheterization today. She simply wants to go outside and smoke a cigarette and she doesn't want the  whole cigarette. She does not know that she could wait any longer. She asked about potentially going home and returning for a outpatient cardiac catheterization.  Is mildly difficult is better off staying in the hospital for cath, but as her EKG has improved today, we could potentially consider scheduling outpatient catheterization provided she understands the risks that are involved with discharge. She would need to be on long-acting nitrate, beta blocker and statin.  Active Problems:   Tobacco dependence - major issue. She has patch on, but is still desiring the taste of the cigarette. Unfortunately, I did explain to her that is counterintuitive to allow her  to go smoke a cigarette when were thinking that this is what the cause of her coronary disease.      Hyperlipidemia - on high-dose statin   Essential hypertension -- not very well controlled today, but she is quite upset.   She was started on low-dose Lopressor yesterday. I suspect that she could probably tolerate higher dose, but this was just increased yesterday. Would await a few more doses prior to changing.   Noncompliance - very concerning for her potential return if she were to be discharged with plan for outpatient cath.   Marijuana dependence ([place])   At this point, she seems to be somewhat more relaxed than when I first started talking to her. We are going to try to see if we can potentially alleviate some of the cath lab schedule.  Unfortunately, the abdominis was too long and several patients that potentially could've had a light breakfast did not have that ordered. At this point, she is probably too close to actually going to the Cath Lab to eat.  I will discuss with interventional colleagues to determine if it would be acceptable recommendation to allow her to be discharged and plan outpatient catheterization. But for now would prefer to consider inpatient.   Signed, Glenetta Hew, M.D., M.S. Interventional Cardiologist    Pager # [phone] Phone # [phone] [address] [place], [place] [address]

## 2015-11-29 ENCOUNTER — Telehealth: Payer: Self-pay | Admitting: Cardiology

## 2015-11-29 ENCOUNTER — Encounter (HOSPITAL_COMMUNITY): Payer: Self-pay | Admitting: Interventional Cardiology

## 2015-11-29 DIAGNOSIS — R079 Chest pain, unspecified: Secondary | ICD-10-CM

## 2015-11-29 LAB — CBC
HEMATOCRIT: 38.7 % (ref 36.0–46.0)
Hemoglobin: 12.6 g/dL (ref 12.0–15.0)
MCH: 31.8 pg (ref 26.0–34.0)
MCHC: 32.6 g/dL (ref 30.0–36.0)
MCV: 97.7 fL (ref 78.0–100.0)
Platelets: 225 10*3/uL (ref 150–400)
RBC: 3.96 MIL/uL (ref 3.87–5.11)
RDW: 12.3 % (ref 11.5–15.5)
WBC: 7.6 10*3/uL (ref 4.0–10.5)

## 2015-11-29 LAB — COMPREHENSIVE METABOLIC PANEL
ALK PHOS: 45 U/L (ref 38–126)
ALT: 11 U/L — AB (ref 14–54)
AST: 15 U/L (ref 15–41)
Albumin: 3.2 g/dL — ABNORMAL LOW (ref 3.5–5.0)
Anion gap: 5 (ref 5–15)
BILIRUBIN TOTAL: 0.6 mg/dL (ref 0.3–1.2)
BUN: 8 mg/dL (ref 6–20)
CALCIUM: 8.8 mg/dL — AB (ref 8.9–10.3)
CO2: 24 mmol/L (ref 22–32)
CREATININE: 0.94 mg/dL (ref 0.44–1.00)
Chloride: 107 mmol/L (ref 101–111)
GFR calc Af Amer: >60 mL/min (ref >60)
Glucose, Bld: 100 mg/dL — ABNORMAL HIGH (ref 65–99)
Potassium: 3.9 mmol/L (ref 3.5–5.1)
Sodium: 136 mmol/L (ref 135–145)
TOTAL PROTEIN: 5.9 g/dL — AB (ref 6.5–8.1)

## 2015-11-29 MED ORDER — ASPIRIN 81 MG PO CHEW: 81.0000 mg | CHEWABLE_TABLET | Freq: Every day | ORAL | 11 refills | Status: DC

## 2015-11-29 MED ORDER — AMLODIPINE BESYLATE 2.5 MG PO TABS: 2.5000 mg | ORAL_TABLET | Freq: Every day | ORAL | 1 refills | Status: DC

## 2015-11-29 MED ORDER — CARVEDILOL 6.25 MG PO TABS: 6.2500 mg | ORAL_TABLET | Freq: Two times a day (BID) | ORAL | 2 refills | Status: DC

## 2015-11-29 MED ORDER — ATORVASTATIN CALCIUM 40 MG PO TABS
40.0000 mg | ORAL_TABLET | Freq: Every day | ORAL | 11 refills | Status: DC
Start: 2015-11-29 — End: 2016-11-27

## 2015-11-29 MED ORDER — CLOPIDOGREL BISULFATE 75 MG PO TABS: 75.0000 mg | ORAL_TABLET | Freq: Every day | ORAL | 11 refills | Status: DC

## 2015-11-29 MED ORDER — ATORVASTATIN CALCIUM 40 MG PO TABS: 40.0000 mg | ORAL_TABLET | Freq: Every day | ORAL | 11 refills | Status: DC

## 2015-11-29 MED ORDER — CARVEDILOL 3.125 MG PO TABS
6.2500 mg | ORAL_TABLET | Freq: Two times a day (BID) | ORAL | Status: DC
  Administered 2015-11-29: 10:00:00 6.25 mg via ORAL
  Filled 2015-11-29: qty 2

## 2015-11-29 MED ORDER — AMLODIPINE BESYLATE 5 MG PO TABS
2.5000 mg | ORAL_TABLET | Freq: Every day | ORAL | Status: DC
  Administered 2015-11-29: 10:00:00 2.5 mg via ORAL
  Filled 2015-11-29: qty 1

## 2015-11-29 MED FILL — CLOPIDOGREL 75 MG TABLET: 75 | 30 days supply | Qty: 30 | Fill #0

## 2015-11-29 MED FILL — AMLODIPINE BESYLATE 2.5 MG: 2.5 | 30 days supply | Qty: 30 | Fill #0

## 2015-11-29 MED FILL — ?CARVEDILOL 6.25 MG TABLET: 6.25 | 30 days supply | Qty: 60 | Fill #0

## 2015-11-29 MED FILL — ?ATORVASTATIN 40MG TABLET: 40 | 30 days supply | Qty: 30 | Fill #0

## 2015-11-29 NOTE — Discharge Instructions (Signed)

## 2015-11-29 NOTE — Progress Notes (Addendum)
Patient Name: Robin Walsh Date of Encounter: 11/29/2015  Hospital Problem List     Principal Problem:   Unstable angina [facility]) Active Problems:   Tobacco dependence   Abnormal finding on EKG - anterior T-wave inversions concerning for LAD ischemia   CAD S/P DES PCI TO mLAD - Synergy DES 3.0 mm x 12 mm   Dyslipidemia, goal LDL below 70   Essential hypertension   Noncompliance   Marijuana dependence ([place])   Right hip pain    Patient Summary     50 year old woman with history of hypertension, chronic tobacco abuse as well as marijuana abuse and hyperlipidemia. She presented with 2 days of worsening him chest/back pain between her shoulder blades. She had mild elevation in her troponin levels, and has significant anterior T-wave inversions/ST depression in EKG concerning for an LAD lesion.  She was seen and evaluated by Dr. Marlou Porch on 8/20in consultation and recommended cardiac catheterization. Authorization form 11/28/2015. See below. PCI to LAD.  Subjective   No further chest pain. Much less anxious or upset. Relieved to have had food to eat. No cath site pain.  Inpatient Medications    . aspirin  81 mg Oral Daily  . aspirin EC  325 mg Oral Daily  . atorvastatin  40 mg Oral q1800  . carvedilol  6.25 mg Oral BID WC  . clopidogrel  75 mg Oral Q breakfast  . sodium chloride flush  3 mL Intravenous Q12H    Vital Signs    Vitals:   11/28/15 1945 11/28/15 2000 11/28/15 2030 11/29/15 0657  BP: (!) 146/78 (!) 146/82 (!) 134/108 (!) 178/96  Pulse: 84   85  Resp: 17 (!) 22 (!) 24 (!) 24  Temp: 97.6 F (36.4 C)   98.3 F (36.8 C)  TempSrc: Oral   Oral  SpO2: 96% 98% 96% 98%  Weight:    154 lb 1.6 oz (69.9 kg)  Height:        Intake/Output Summary (Last 24 hours) at 11/29/15 0720 Last data filed at 11/29/15 0657  Gross per 24 hour  Intake           714.24 ml  Output             1400 ml  Net          -685.76 ml   Filed Weights   11/27/15 0447 11/28/15 0540  11/29/15 0657  Weight: 157 lb 1.6 oz (71.3 kg) 154 lb (69.9 kg) 154 lb 1.6 oz (69.9 kg)    Physical Exam    GEN: Well nourished, well developed, in no acute distress. Smells like cigarettes.  HEENT: normal.  Neck: Supple, no JVD, carotid bruits, or masses. Cardiac: RRR, no murmurs, rubs, or gallops. No clubbing, cyanosis, edema.  Radials/DP/PT 2+ and equal bilaterally.  Respiratory:  Respirations regular and unlabored, clear to auscultation bilaterally. GI: Soft, nontender, nondistended, BS + x 4. Neuro:  Strength and sensation are intact. Psych: Normal affect.She is very anxious and frustrated Radial access site C/D/I. Nontender.   Labs    CBC  Recent Labs  11/28/15 0529 11/29/15 0021  WBC 10.1 7.6  HGB 15.1* 12.6  HCT 46.9* 38.7  MCV 97.1 97.7  PLT 287 [id number]   Basic Metabolic Panel  Recent Labs  11/26/15 2247 11/29/15 0021  NA 136 136  K 3.6 3.9  CL 102 107  CO2 26 24  GLUCOSE 105* 100*  BUN <5* 8  CREATININE 0.99 0.94  CALCIUM 9.8  8.8*   Liver Function Tests  Recent Labs  11/29/15 0021  AST 15  ALT 11*  ALKPHOS 45  BILITOT 0.6  PROT 5.9*  ALBUMIN 3.2*   No results for input(s): LIPASE, AMYLASE in the last 72 hours. Cardiac Enzymes  Recent Labs  11/27/15 0453 11/27/15 1025 11/27/15 1617  TROPONINI 0.04* 0.04* 0.03*   BNP Invalid input(s): POCBNP D-Dimer No results for input(s): DDIMER in the last 72 hours. Hemoglobin A1C  Recent Labs  11/27/15 0453  HGBA1C 5.8*   Fasting Lipid Panel  Recent Labs  11/27/15 0453  CHOL 218*  HDL 28*  LDLCALC 124*  TRIG 331*  CHOLHDL 7.8   Thyroid Function Tests  Recent Labs  11/27/15 0453  TSH 2.320    Telemetry    Sinus rhythm 70s to 90s  ECG    Sinus rhythm, rate 91. Persistent mild ST depressions with T-wave inversions in V1 and V2. Less pronounced from an ischemia standpoint than prior EKG. =-- The dramatic T-wave inversions in V2 and V3 were no longer present. - no change from  yesterday  Cardiology    Procedures 11/28/15  Coronary Stent Intervention  Left Heart Cath and Coronary Angiography  Conclusion    Mid LAD lesion, 90 %stenosed. A STENT SYNERGY DES 3X12 drug eluting stent was successfully placed.  Post intervention, there is a 0% residual stenosis  The left ventricular systolic function is normal.The left ventricular ejection fraction is 55-65% by visual estimate.  LV end diastolic pressure is normal.  There is no aortic valve stenosis.  Of note, she had radial artery vasospasm. The PCI was performed through a 5 Fr guide catheter.   Continue dual antiplatelet therapy for at least a year.  Continue aggressive secondary prevention including smoking cessation.    Assessment & Plan    Principal Problem:   Unstable angina (HCC)Abnormal finding on EKG - anterior T-wave inversions concerning for LAD ischemia  She actually had low levels of troponin, would not do so to consider this to be non-STEMI, however it could be acute coronary syndrome. She noted intrascapular burning with radiation to her jaw and neck. Her initial EKG while she was having symptoms was significant for potential anterior ischemia. Now improved.  Cath yesterday revealed 90% mLAD lesion - treated with DES stent  On ASA & Plavix - for at least 3 months, would be OK to hold ASA after & continue Plavix alone   On statin   Active Problems:   Tobacco dependence - major issue. She has patch on, but is still desiring the taste of the cigarette. Unfortunately, I did explain to her that is counterintuitive to allow her to go smoke a cigarette when were thinking that this is what the cause of her coronary disease.      Hyperlipidemia - on high-dose statin   Essential hypertension -- not very well controlled today, but she is quite upset.   She was started on low-dose Lopressor yesterday. I suspect that she could probably tolerate higher dose, but this was just increased yesterday. --  convert to Carvedilol & add low dose Amlodipine due to vasospasm    Noncompliance - heavily reinforced the need for her to take dual antiplatelet therapy for her DES stent.   Dispo:Should be ready for discharge from a cardiology standpoint today. We will arrange outpatient follow-up.    Signed, Glenetta Hew, M.D., M.S. Interventional Cardiologist   Pager # [phone] Phone # [phone] [address] [place], [place] [address]

## 2015-11-29 NOTE — Plan of Care (Signed)
Problem: Food- and Nutrition-Related Knowledge Deficit (NB-1.1) Goal: Nutrition education Formal process to instruct or train a patient/client in a skill or to impart knowledge to help patients/clients voluntarily manage or modify food choices and eating behavior to maintain or improve health. Outcome: Completed/Met Date Met: 11/29/15 Nutrition Education Note  Lipid Panel     Component Value Date/Time   CHOL 218 (H) 11/27/2015 0453   TRIG 331 (H) 11/27/2015 0453   HDL 28 (L) 11/27/2015 0453   CHOLHDL 7.8 11/27/2015 0453   VLDL 66 (H) 11/27/2015 0453   LDLCALC 124 (H) 11/27/2015 0453    RD provided "Heart Healthy Nutrition Therapy" handout from the Academy of [employer]. Reviewed patient's dietary recall. Pt reports eating out (restaurants,fast food) half of the week. Provided examples on ways to decrease sodium and fat intake in diet. Discouraged intake of processed foods and use of salt shaker. Encouraged fresh fruits and vegetables as well as whole grain sources of carbohydrates to maximize fiber intake. Pt reports disliking most fruits and vegetables as they either cause abdominal pains or cause scratch/itchy like symptoms in the mouth and throat. Pt reports she is willing to consume apples and salad as they do not cause adverse symptoms. Pt additionally consumes ~1 liter of [employer] a day. Recommended reducing the amount of sugar sweetened beverages in her diet. Additionally recommended a multivitamin and to increase fiber intake either by medication or within the diet. Pt expressed understanding. Family at bedside. Teach back method used.  Expect fair compliance.  Body mass index is 26.45 kg/m. Pt meets criteria for overweight based on current BMI.  Current diet order is heart, patient is consuming approximately 100% of meals at this time. Labs and medications reviewed. No further nutrition interventions warranted at this time. RD contact information provided. If  additional nutrition issues arise, please re-consult RD.  Corrin Parker, MS, RD, LDN Pager # [phone] After hours/ weekend pager # [phone]

## 2015-11-29 NOTE — Discharge Summary (Addendum)
Physician Discharge Summary  HARUNA ROHLFS MRN: [id number] DOB/AGE: 07-22-1965 50 y.o.  PCP: No primary care provider on file.   Admit date: 11/26/2015 Discharge date: 11/29/2015  Discharge Diagnoses:    Principal Problem:   Unstable angina [facility]) Active Problems:   Essential hypertension   Dyslipidemia, goal LDL below 70   Tobacco dependence   Noncompliance   Marijuana dependence ([place])   Right hip pain   Abnormal finding on EKG - anterior T-wave inversions concerning for LAD ischemia   CAD S/P DES PCI TO mLAD - Synergy DES 3.0 mm x 12 mm   Chest pain    Follow-up recommendations Follow-up with PCP in 3-5 days , including all  additional recommended appointments as below Follow-up CBC, CMP in 3-5 days Mid LAD lesion, 90 %stenosed. A STENT SYNERGY DES 3X12 drug eluting stent was successfully placed.    On ASA & Plavix - for at least 3 months, would be OK to hold ASA after & continue Plavix alone     Current Discharge Medication List    START taking these medications   Details  amLODipine (NORVASC) 2.5 MG tablet Take 1 tablet (2.5 mg total) by mouth daily. Qty: 30 tablet, Refills: 1    aspirin 81 MG chewable tablet Chew 1 tablet (81 mg total) by mouth daily. Qty: 30 tablet, Refills: 11    atorvastatin (LIPITOR) 40 MG tablet Take 1 tablet (40 mg total) by mouth daily at 6 PM. Qty: 30 tablet, Refills: 11    carvedilol (COREG) 6.25 MG tablet Take 1 tablet (6.25 mg total) by mouth 2 (two) times daily with a meal. Qty: 60 tablet, Refills: 2    clopidogrel (PLAVIX) 75 MG tablet Take 1 tablet (75 mg total) by mouth daily with breakfast. Qty: 30 tablet, Refills: 11      CONTINUE these medications which have NOT CHANGED   Details  albuterol (PROVENTIL HFA;VENTOLIN HFA) 108 (90 Base) MCG/ACT inhaler Inhale 1-2 puffs into the lungs every 6 (six) hours as needed for wheezing or shortness of breath.      STOP taking these medications     ibuprofen (ADVIL,MOTRIN) 200 MG  tablet           Discharge Condition: Stable   Discharge Instructions Get Medicines reviewed and adjusted: Please take all your medications with you for your next visit with your Primary MD  Please request your Primary MD to go over all hospital tests and procedure/radiological results at the follow up, please ask your Primary MD to get all Hospital records sent to his/her office.  If you experience worsening of your admission symptoms, develop shortness of breath, life threatening emergency, suicidal or homicidal thoughts you must seek medical attention immediately by calling 911 or calling your MD immediately if symptoms less severe.  You must read complete instructions/literature along with all the possible adverse reactions/side effects for all the Medicines you take and that have been prescribed to you. Take any new Medicines after you have completely understood and accpet all the possible adverse reactions/side effects.   Do not drive when taking Pain medications.   Do not take more than prescribed Pain, Sleep and Anxiety Medications  Special Instructions: If you have smoked or chewed Tobacco in the last 2 yrs please stop smoking, stop any regular Alcohol and or any Recreational drug use.  Wear Seat belts while driving.  Please note  You were cared for by a hospitalist during your hospital stay. Once you are discharged, your primary  care physician will handle any further medical issues. Please note that NO REFILLS for any discharge medications will be authorized once you are discharged, as it is imperative that you return to your primary care physician (or establish a relationship with a primary care physician if you do not have one) for your aftercare needs so that they can reassess your need for medications and monitor your lab values.     Allergies  Allergen Reactions  . Codeine Nausea And Vomiting  . Dilaudid [Hydromorphone Hcl] Nausea And Vomiting  . Penicillins  Rash    Was told when she was a child that she was allergic.  Can take Amoxicillin.       Disposition:    Consults:  Cardiology    Significant Diagnostic Studies:  Dg Chest 2 View  Result Date: 11/26/2015 CLINICAL DATA:  Burning sensation in the chest. EXAM: CHEST  2 VIEW COMPARISON:  03/28/2004 FINDINGS: Normal heart size and mediastinal contours. No infiltrate or edema. No effusion or pneumothorax. No acute osseous findings. IMPRESSION: Negative chest. Electronically Signed   By: Monte Fantasia M.D.   On: 11/26/2015 22:46   Nm Myocar Single W/spect W/wall Motion And Ef  Result Date: 11/27/2015 CLINICAL DATA:  Hypertension.  Chest pain. EXAM: MYOCARDIAL IMAGING WITH SPECT (REST) TECHNIQUE: Standard myocardial SPECT imaging was performed after resting intravenous injection of 10 Tc-69mtetrofosmin. COMPARISON:  Chest radiograph 11/16/2015 FINDINGS: Perfusion: There may be mildly decreased rest activity involving the mid to basilar segments of the septum. IMPRESSION: 1. Only the rest portion of the exam was performed. The study was halted after the rest images at clinical service request. 2. There may be mildly decreased activity involving the mid to basilar segments of the septum. This is nonspecific and of indeterminate significance given absence of stress imaging. Electronically Signed   By: KAbigail MiyamotoM.D.   On: 11/27/2015 12:45        Left heart cath 11/27/15  The left ventricular systolic function is normal.  LV end diastolic pressure is normal.  The left ventricular ejection fraction is 55-65% by visual estimate.  There is no aortic valve stenosis.  Mid LAD lesion, 90 %stenosed. A STENT SYNERGY DES 3X12 drug eluting stent was successfully placed.  Post intervention, there is a 0% residual stenosis.  Of note, she had radial artery vasospasm. The PCI was performed through a 5 Fr guide catheter.   Continue dual antiplatelet therapy for at least a year.  Continue  aggressive secondary prevention including smoking cessation.  IV tirfiban for 2 hours.     Filed Weights   11/27/15 0447 11/28/15 0540 11/29/15 0657  Weight: 71.3 kg (157 lb 1.6 oz) 69.9 kg (154 lb) 69.9 kg (154 lb 1.6 oz)     Microbiology: No results found for this or any previous visit (from the past 240 hour(s)).     Blood Culture No results found for: SDES, [place] CULT, REPTSTATUS    Labs: Results for orders placed or performed during the hospital encounter of 11/26/15 (from the past 48 hour(s))  Troponin I-serum (0, 3, 6 hours)     Status: Abnormal   Collection Time: 11/27/15 10:25 AM  Result Value Ref Range   Troponin I 0.04 (HH) <0.03 ng/mL    Comment: CRITICAL VALUE NOTED.  VALUE IS CONSISTENT WITH PREVIOUSLY REPORTED AND CALLED VALUE.  Troponin I-serum (0, 3, 6 hours)     Status: Abnormal   Collection Time: 11/27/15  4:17 PM  Result Value Ref Range  Troponin I 0.03 (HH) <0.03 ng/mL    Comment: CRITICAL VALUE NOTED.  VALUE IS CONSISTENT WITH PREVIOUSLY REPORTED AND CALLED VALUE.  Heparin level (unfractionated)     Status: Abnormal   Collection Time: 11/27/15  4:17 PM  Result Value Ref Range   Heparin Unfractionated <0.10 (L) 0.30 - 0.70 IU/mL    Comment:        IF HEPARIN RESULTS ARE BELOW EXPECTED VALUES, AND PATIENT DOSAGE HAS BEEN CONFIRMED, SUGGEST FOLLOW UP TESTING OF ANTITHROMBIN III LEVELS.   Protime-INR     Status: None   Collection Time: 11/27/15  4:17 PM  Result Value Ref Range   Prothrombin Time 14.7 11.4 - 15.2 seconds   INR 1.14   Heparin level (unfractionated)     Status: Abnormal   Collection Time: 11/28/15  1:23 AM  Result Value Ref Range   Heparin Unfractionated 0.23 (L) 0.30 - 0.70 IU/mL    Comment:        IF HEPARIN RESULTS ARE BELOW EXPECTED VALUES, AND PATIENT DOSAGE HAS BEEN CONFIRMED, SUGGEST FOLLOW UP TESTING OF ANTITHROMBIN III LEVELS.   CBC     Status: Abnormal   Collection Time: 11/28/15  5:29 AM  Result Value Ref  Range   WBC 10.1 4.0 - 10.5 K/uL   RBC 4.83 3.87 - 5.11 MIL/uL   Hemoglobin 15.1 (H) 12.0 - 15.0 g/dL   HCT 46.9 (H) 36.0 - 46.0 %   MCV 97.1 78.0 - 100.0 fL   MCH 31.3 26.0 - 34.0 pg   MCHC 32.2 30.0 - 36.0 g/dL   RDW 12.4 11.5 - 15.5 %   Platelets 287 150 - 400 K/uL  Heparin level (unfractionated)     Status: Abnormal   Collection Time: 11/28/15  9:22 AM  Result Value Ref Range   Heparin Unfractionated 0.28 (L) 0.30 - 0.70 IU/mL    Comment:        IF HEPARIN RESULTS ARE BELOW EXPECTED VALUES, AND PATIENT DOSAGE HAS BEEN CONFIRMED, SUGGEST FOLLOW UP TESTING OF ANTITHROMBIN III LEVELS.   Pregnancy, urine     Status: None   Collection Time: 11/28/15  4:07 PM  Result Value Ref Range   Preg Test, Ur NEGATIVE NEGATIVE    Comment:        THE SENSITIVITY OF THIS METHODOLOGY IS >20 mIU/mL.   POCT Activated clotting time     Status: None   Collection Time: 11/28/15  6:12 PM  Result Value Ref Range   Activated Clotting Time 340 seconds  CBC     Status: None   Collection Time: 11/29/15 12:21 AM  Result Value Ref Range   WBC 7.6 4.0 - 10.5 K/uL   RBC 3.96 3.87 - 5.11 MIL/uL   Hemoglobin 12.6 12.0 - 15.0 g/dL   HCT 38.7 36.0 - 46.0 %   MCV 97.7 78.0 - 100.0 fL   MCH 31.8 26.0 - 34.0 pg   MCHC 32.6 30.0 - 36.0 g/dL   RDW 12.3 11.5 - 15.5 %   Platelets 225 150 - 400 K/uL  Comprehensive metabolic panel     Status: Abnormal   Collection Time: 11/29/15 12:21 AM  Result Value Ref Range   Sodium 136 135 - 145 mmol/L   Potassium 3.9 3.5 - 5.1 mmol/L   Chloride 107 101 - 111 mmol/L   CO2 24 22 - 32 mmol/L   Glucose, Bld 100 (H) 65 - 99 mg/dL   BUN 8 6 - 20 mg/dL   Creatinine, Ser  0.94 0.44 - 1.00 mg/dL   Calcium 8.8 (L) 8.9 - 10.3 mg/dL   Total Protein 5.9 (L) 6.5 - 8.1 g/dL   Albumin 3.2 (L) 3.5 - 5.0 g/dL   AST 15 15 - 41 U/L   ALT 11 (L) 14 - 54 U/L   Alkaline Phosphatase 45 38 - 126 U/L   Total Bilirubin 0.6 0.3 - 1.2 mg/dL   GFR calc non Af Amer >60 >60 mL/min   GFR  calc Af Amer >60 >60 mL/min    Comment: (NOTE) The eGFR has been calculated using the CKD EPI equation. This calculation has not been validated in all clinical situations. eGFR's persistently <60 mL/min signify possible Chronic Kidney Disease.    Anion gap 5 5 - 15     Lipid Panel     Component Value Date/Time   CHOL 218 (H) 11/27/2015 0453   TRIG 331 (H) 11/27/2015 0453   HDL 28 (L) 11/27/2015 0453   CHOLHDL 7.8 11/27/2015 0453   VLDL 66 (H) 11/27/2015 0453   LDLCALC 124 (H) 11/27/2015 0453     Lab Results  Component Value Date   HGBA1C 5.8 (H) 11/27/2015        HPI :*   Ms. Gallentine is a 50 year old female with a past medical history of HTN, current tobacco abuse and HLD. She presented to the ED on 11/26/15 with 2-3 day history of a burning sensation between her shoulder blades that sometimes radiates down both arms.Her EKG shows deep anterior ST depression concerning for a LAD lesion. She has a family history of CAD, her mother had CABG at age 50 and recently passed from heart disease. Patient is a current every day smoker, and says that she eats a very unhealthy diet. Her initial troponin was 0.03, next was 0.04. She was seen and evaluated by Dr. Skains yesterday in consultation and recommended cardiac catheterization  HOSPITAL COURSE:   Unstable angina (HCC)Abnormal finding on EKG - anterior T-wave inversions concerning for LAD ischemia Placed on IV heparin, IV nitroglycerin Status post left heart cath, results as above, On ASA & Plavix - for at least 3 months, would be OK to hold ASA after & continue Plavix alone per Dr HARDING  Cardiology to arrange for follow-up,ready for discharge from a cardiology standpoint today  Hypertriglyceridemia-LDL 124, triglycerides 331, started on a statin  HTN -Patient with reported h/o HTN but has not seen a physician in years.  -BP is markedly abnormal here in ER Started on Coreg,add low dose Amlodipine due to  vasospasm   Tobacco Dependence -Encourage cessation. This was discussed with the patient and should be reviewed on an ongoing basis.  -Patch declined by patient.  Marijuana Dependence -Encourage cessation. This was discussed with the patient and should be reviewed on an ongoing basis.   Right hip pain description of the pain was very consistent with intermittent claudication. Will need ABI as outpatient   Medical Non-Compliance -Encouragement provided to start making better choices with regards to smoking, medications, physician follow-up. -Care management consult placed for assistance in locating a PCP.  Discharge Exam:   Blood pressure (!) 178/96, pulse 85, temperature 98.3 F (36.8 C), temperature source Oral, resp. rate (!) 24, height 5' 4" (1.626 m), weight 69.9 kg (154 lb 1.6 oz), last menstrual period 08/26/2015, SpO2 98 %. GEN: Well nourished, well developed, in no acute distress. Smells like cigarettes.  HEENT: normal.  Neck: Supple, no JVD, carotid bruits, or masses. Cardiac: RRR, no   murmurs, rubs, or gallops. No clubbing, cyanosis, edema.  Radials/DP/PT 2+ and equal bilaterally.  Respiratory:  Respirations regular and unlabored, clear to auscultation bilaterally. GI: Soft, nontender, nondistended, BS + x 4. Neuro:  Strength and sensation are intact.     Follow-up Information    Primary care provider. Schedule an appointment as soon as possible for a visit in 2 day(s).   Why:  Hospital follow-up       Candee Furbish, MD. Schedule an appointment as soon as possible for a visit in 2 week(s).   Specialty:  Cardiology Why:  Call office to make this appointment as soon as possible Contact information: [address] [place] [address] [phone]           Signed: Reyne Dumas 11/29/2015, 7:43 AM        Time spent >45 mins

## 2015-11-29 NOTE — Telephone Encounter (Signed)
New Message  TOC appt with Nell Range on 9.1 at 3:00 pm from [facility].

## 2015-11-29 NOTE — Progress Notes (Signed)
Up in hall walking independently    MODE:  Ambulation: 600 ft   POST:  Rate/Rhythm: 93 SR  BP:  Supine:   Sitting: 207/96, 182/105,  182/90  Standing:    SaO2:   0800-0900 Up in hall walking independently. Denied CP and stated legs feeling better too. Did not hurt with this distance. BP very elevated after walk. Education completed with pt who voiced understanding. Discussed importance of plavix with stent. Reviewed NTG use, risk factors, smoking cessation , heart healthy diet and ex ed. Discussed smoking cessation and gave pt fake cigarette and smoking cessation handout. Pt stated she has tried chantix and welbutrin. Discussed diet and pt has many obstacles to eating healthy as she stated some fruits make her throat itch, she doesn't like any green vegetables and only ice berg lettuce, has difficulty with the consistency of foods, and diet limited by what she can eat. Asked for dietitian to see as I gave heart healthy diet and wrote down video of low cholesterol video but feel she needs more instruction.  Graylon Good, RN BSN  11/29/2015 8:56 AM

## 2015-11-29 NOTE — Care Management Note (Addendum)
Case Management Note  Patient Details  Name: Robin Walsh MRN: [id number] Date of Birth: 06-04-1965  Subjective/Objective:  Patient is indep pta, will be on plavix per Rubye [place], NCM scheduled follow up apt for patient at [facility] clinic for 8/25 at 9 am.  She will need ast with medications will need paper scripts or send meds to [facility].  NCM gave patient brochure for [facility] clinic also.                    Action/Plan:   Expected Discharge Date:                  Expected Discharge Plan:  Home/Self Care  In-House Referral:     Discharge planning Services  CM Consult, Follow-up appt scheduled, [facility]  Post Acute Care Choice:    Choice offered to:     DME Arranged:    DME Agency:     HH Arranged:    HH Agency:     Status of Service:  Completed, signed off  If discussed at [employer] of Stay Meetings, dates discussed:    Additional Comments:  Zenon Mayo, RN 11/29/2015, 10:49 AM

## 2015-11-30 NOTE — Telephone Encounter (Signed)
Left a message for the pt to call back.  

## 2015-12-01 NOTE — Telephone Encounter (Signed)
LMTCB

## 2015-12-01 NOTE — Telephone Encounter (Signed)
Patient contacted regarding discharge from [facility] on 11/29/15  Patient understands to follow up with pharmacist at 2:30PM and Nell Range, [place] at [facility] at [facility]. Patient understands discharge instructions? yes Patient understands medications and regiment? yes Patient understands to bring all medications to this visit? yes

## 2015-12-01 NOTE — Telephone Encounter (Signed)
Follow Up:; ° ° °Returning your call. °

## 2015-12-02 ENCOUNTER — Ambulatory Visit: Payer: Self-pay | Attending: Internal Medicine | Admitting: Physician Assistant

## 2015-12-02 VITALS — BP 134/91 | HR 87 | Temp 97.7°F | Wt 160.0 lb

## 2015-12-02 DIAGNOSIS — I739 Peripheral vascular disease, unspecified: Secondary | ICD-10-CM | POA: Insufficient documentation

## 2015-12-02 DIAGNOSIS — I2510 Atherosclerotic heart disease of native coronary artery without angina pectoris: Secondary | ICD-10-CM | POA: Insufficient documentation

## 2015-12-02 DIAGNOSIS — Z888 Allergy status to other drugs, medicaments and biological substances status: Secondary | ICD-10-CM | POA: Insufficient documentation

## 2015-12-02 DIAGNOSIS — Z8249 Family history of ischemic heart disease and other diseases of the circulatory system: Secondary | ICD-10-CM | POA: Insufficient documentation

## 2015-12-02 DIAGNOSIS — Z9889 Other specified postprocedural states: Secondary | ICD-10-CM | POA: Insufficient documentation

## 2015-12-02 DIAGNOSIS — Z7982 Long term (current) use of aspirin: Secondary | ICD-10-CM | POA: Insufficient documentation

## 2015-12-02 DIAGNOSIS — I10 Essential (primary) hypertension: Secondary | ICD-10-CM | POA: Insufficient documentation

## 2015-12-02 DIAGNOSIS — Z720 Tobacco use: Secondary | ICD-10-CM

## 2015-12-02 DIAGNOSIS — F17200 Nicotine dependence, unspecified, uncomplicated: Secondary | ICD-10-CM

## 2015-12-02 DIAGNOSIS — F1290 Cannabis use, unspecified, uncomplicated: Secondary | ICD-10-CM | POA: Insufficient documentation

## 2015-12-02 DIAGNOSIS — Z79899 Other long term (current) drug therapy: Secondary | ICD-10-CM | POA: Insufficient documentation

## 2015-12-02 DIAGNOSIS — F17210 Nicotine dependence, cigarettes, uncomplicated: Secondary | ICD-10-CM | POA: Insufficient documentation

## 2015-12-02 DIAGNOSIS — Z880 Allergy status to penicillin: Secondary | ICD-10-CM | POA: Insufficient documentation

## 2015-12-02 DIAGNOSIS — E785 Hyperlipidemia, unspecified: Secondary | ICD-10-CM | POA: Insufficient documentation

## 2015-12-02 NOTE — Patient Instructions (Signed)
Ok to walk for 15 min per day until seen by Cardiology.  Discuss leg pains with walking with the Cardiology team. Take all medications as prescribed daily especially the Plavix/Clopidogrel. Stop smoking!!

## 2015-12-02 NOTE — Progress Notes (Signed)
Robin Walsh  [id number]  [id number]  DOB - [birth year]  Chief Complaint  Patient presents with  . Hospitalization Follow-up    chest pains       Subjective:   Robin Walsh is a 50 y.o. female here today for establishment of care. She has a history of hypertension, she smokes, and has family history of coronary artery disease. She also has a history of hyperlipidemia. She went to the emergency room on 11/26/2015 with complaints of chest pain. Her EKG showed sinus rhythm with T-wave inversion anteriorly. Her troponin peaked at 0.04. She was admitted by the internal medicine team cardiology consultation. She was set up for cardiac catheterization and had a drug-eluting stent to  LAD artery. This was a 90% blocked. She was placed on dual antiplatelet therapy with aspirin and Plavix.  Since discharge he continues to smoke one half pack per day. Her chest pain has subsided. Sometimes she occasionally feels little heavy in her arms. She is much better overall. No nausea, vomiting or diaphoresis. She does admit that when she walks a great distance she has pain in her legs.    ROS: GEN: denies fever or chills, denies change in weight Skin: denies lesions or rashes HEENT: denies headache, earache, epistaxis, sore throat, or neck pain LUNGS: denies SHOB, dyspnea, PND, orthopnea CV: denies CP or palpitations ABD: denies abd pain, N or V EXT: denies muscle spasms or swelling; no pain in lower ext, no weakness NEURO: denies numbness or tingling, denies sz, stroke or TIA  ALLERGIES: Allergies  Allergen Reactions  . Codeine Nausea And Vomiting  . Dilaudid [Hydromorphone Hcl] Nausea And Vomiting  . Penicillins Rash    Was told when she was a child that she was allergic.  Can take Amoxicillin.     PAST MEDICAL HISTORY: Past Medical History:  Diagnosis Date  . Hyperlipidemia   . Hypertension     PAST SURGICAL HISTORY: Past Surgical History:  Procedure Laterality Date  .  CARDIAC CATHETERIZATION N/A 11/28/2015   Procedure: Left Heart Cath and Coronary Angiography;  Surgeon: Jettie Booze, MD;  Location: [facility];  Service: Cardiovascular;  Laterality: N/A;  . CARDIAC CATHETERIZATION N/A 11/28/2015   Procedure: Coronary Stent Intervention;  Surgeon: Jettie Booze, MD;  Location: [facility];  Service: Cardiovascular;  Laterality: N/A;  DES to Mid LAD; 3.0x12 Synergy  . LEG SURGERY  2002?   tibial plateau fracture    MEDICATIONS AT HOME: Prior to Admission medications   Medication Sig Start Date End Date Taking? Authorizing Provider  albuterol (PROVENTIL HFA;VENTOLIN HFA) 108 (90 Base) MCG/ACT inhaler Inhale 1-2 puffs into the lungs every 6 (six) hours as needed for wheezing or shortness of breath.    Historical Provider, MD  amLODipine (NORVASC) 2.5 MG tablet Take 1 tablet (2.5 mg total) by mouth daily. 11/29/15   Reyne Dumas, MD  aspirin 81 MG chewable tablet Chew 1 tablet (81 mg total) by mouth daily. 11/29/15   Reyne Dumas, MD  atorvastatin (LIPITOR) 40 MG tablet Take 1 tablet (40 mg total) by mouth daily at 6 PM. 11/29/15   Reyne Dumas, MD  carvedilol (COREG) 6.25 MG tablet Take 1 tablet (6.25 mg total) by mouth 2 (two) times daily with a meal. 11/29/15   Reyne Dumas, MD  clopidogrel (PLAVIX) 75 MG tablet Take 1 tablet (75 mg total) by mouth daily with breakfast. 11/29/15   Reyne Dumas, MD     Objective:   Vitals:  12/02/15 0912  BP: (!) 134/91  Pulse: 87  Temp: 97.7 F (36.5 C)  TempSrc: Oral  SpO2: 97%  Weight: 160 lb (72.6 kg)    Exam General appearance : Awake, alert, not in any distress. Speech Clear. Not toxic looking HEENT: Atraumatic and Normocephalic, pupils equally reactive to light and accomodation Neck: supple, no JVD. No cervical lymphadenopathy.  Chest:Good air entry bilaterally, no added sounds  CVS: S1 S2 regular, no murmurs.  Abdomen: Bowel sounds present, Non tender and not distended with no  gaurding, rigidity or rebound. Extremities: B/L Lower Ext shows no edema, both legs are warm to touch Neurology: Awake alert, and oriented X 3, CN II-XII intact, Non focal Cath site-ecchymotic but no hematoma, soft, minimally tender  Data Review Lab Results  Component Value Date   HGBA1C 5.8 (H) 11/27/2015     Assessment & Plan  1. CAD s/p PCI DES LAD 11/28/15  -ASA, Plavix  -RF modification  -CV appt on 12/09/15 2. Leg claudication  -dw Cards on 9/1   -smoking cessation 3. HTN  -Cont Norvasc, BB  -DASH diet 4. Dyslipidemia  -cont Lipitor 5. Smoker/MJ use  -cessation discussed at length    Return in about 1 month (around 01/02/2016).  The patient was given clear instructions to go to ER or return to medical center if symptoms don't improve, worsen or new problems develop. The patient verbalized understanding. The patient was told to call to get lab results if they haven't heard anything in the next week.   This note has been created with Surveyor, quantity. Any transcriptional errors are unintentional.    Zettie Pho, PA-C [facility] [place], [place]   12/02/2015, 9:38 AM

## 2015-12-08 NOTE — Progress Notes (Signed)
Patient ID: Robin Walsh                 DOB: 1965/04/30                      MRN: [id number]    Pharmacy Transitions of Care Visit  HPI: Robin Walsh is a 50 y.o. female discharged on 8/22 with primary diagnosis of unstable angina/NSTEMI s/p DES to LAD. PMH is significant for HTN, HLD, tobacco and marijuana abuse, and recently diagnosed CAD. Left heart cath on 11/28/15 revealed mid LAD lesion with 90% stenosis treated with DES and 0% residual stenosis. LVEF 55-60% and did have radial artery vasospasm. Patient presents to clinic for pharmacy transitions of care medication reconciliation after hospital discharge.  All medications have been reviewed with patient. She complains of some right leg pain likely related to PAD. Discussed diet extensively - patient eats a lot of white bread and drinks 2 regular [employer] daily. Prediabetic A1c 5.8% and elevated TG as a result. She is willing to work on dietary improvements. She is not ready to quit smoking at this time. She has tried bupropion and Chantix but experienced hives and nausea/bloating with these, respectively.   Issues/concerns noted are as follows: -Not discharged with NTG rx -Not discharged on ACEi -Still smoking 1/2-1 PPD and not ready to quit, using marijuana daily -TG elevated > 300 secondary to dietary indiscretion, A1c also prediabetic at 5.8%   Past Medical History:  Diagnosis Date  . Hyperlipidemia   . Hypertension     Current Outpatient Prescriptions on File Prior to Visit  Medication Sig Dispense Refill  . albuterol (PROVENTIL HFA;VENTOLIN HFA) 108 (90 Base) MCG/ACT inhaler Inhale 1-2 puffs into the lungs every 6 (six) hours as needed for wheezing or shortness of breath.    Marland Kitchen amLODipine (NORVASC) 2.5 MG tablet Take 1 tablet (2.5 mg total) by mouth daily. 30 tablet 1  . aspirin 81 MG chewable tablet Chew 1 tablet (81 mg total) by mouth daily. 30 tablet 11  . atorvastatin (LIPITOR) 40 MG tablet Take 1 tablet (40 mg  total) by mouth daily at 6 PM. 30 tablet 11  . carvedilol (COREG) 6.25 MG tablet Take 1 tablet (6.25 mg total) by mouth 2 (two) times daily with a meal. 60 tablet 2  . clopidogrel (PLAVIX) 75 MG tablet Take 1 tablet (75 mg total) by mouth daily with breakfast. 30 tablet 11   No current facility-administered medications on file prior to visit.     Allergies  Allergen Reactions  . Codeine Nausea And Vomiting  . Dilaudid [Hydromorphone Hcl] Nausea And Vomiting  . Penicillins Rash    Was told when she was a child that she was allergic.  Can take Amoxicillin.     Assessment/Plan:  1. ACS transitions of care med rec:  - Discussed importance of tobacco cessation. Pt intolerant to Chantix (nausea and bloating) and bupropion (rash), not interested in NRT at this time. Still smoking > 1/2 PPD and will work to cut back on tobacco use.  - Triglycerides elevated > 300 due to dietary indiscretion. Pt will work to substitute 2 regular [employer] with sugar free alternatives. Should help with prediabetic A1c of 5.8% as well. Counseled on importance of low sugar and low fat diet. Continue Lipitor 40mg  daily.  - Discussed the following with Robin Range, PA: Pt needs nitroglycerin rx Will increase carvedilol to 12.5mg  BID to target HR in low 60s Will d/c  amlodipine and initiate lisinopril 5mg  daily s/p ACS event   Robin Walsh, PharmD, [place] [id number], [place], [place] [address] Phone: [phone]; Fax: [phone] 12/09/2015 3:28 PM

## 2015-12-08 NOTE — Progress Notes (Signed)
Cardiology Office Note    Date:  12/09/2015   ID:  TAHARA VOSTERS, DOB [birth year], MRN [id number]  PCP:  Brayton Caves, PA-C  Cardiologist:  Dr. Marlou Porch  CC: post hospital follow up   History of Present Illness:  Robin Walsh is a 50 y.o. female with a history of HTN, HLD, tobacco abuse and recently diagnosed CAD s/p DES to LAD who presents to clinic for post hospital follow up.  She was admitted 8/19-8/22/17. She presented with chest pain and ruled in with mildly elevated troponin. ECG showed anterior T-wave inversions concerning for LAD ischemia. She underwent cath on 11/28/15 which showed 90% mLAD lesion s/p DES and normal LV function. Cath was c/b radial artery spasm. She was discharged on Aspirin, plavix, statin and BB as well as low dose amlodipine for vasospasm.  Today she presents to clinic for follow up. She is still smoking but wants to cut back. She has had some mild chest pain but nothing like what brought her in the ER. She feels like she "has bronchitis." She complains of exertional burning pain in her right leg that is better with rest. No SOB. No LE edema, orthopnea or PND. No dizziness or syncope. No blood in stool or urine. She is walking at least 15 minutes a day but this is limited by leg pain.     Past Medical History:  Diagnosis Date  . Hyperlipidemia   . Hypertension     Past Surgical History:  Procedure Laterality Date  . CARDIAC CATHETERIZATION N/A 11/28/2015   Procedure: Left Heart Cath and Coronary Angiography;  Surgeon: Jettie Booze, MD;  Location: [facility];  Service: Cardiovascular;  Laterality: N/A;  . CARDIAC CATHETERIZATION N/A 11/28/2015   Procedure: Coronary Stent Intervention;  Surgeon: Jettie Booze, MD;  Location: [facility];  Service: Cardiovascular;  Laterality: N/A;  DES to Mid LAD; 3.0x12 Synergy  . LEG SURGERY  2002?   tibial plateau fracture    Current Medications: Outpatient Medications Prior to Visit    Medication Sig Dispense Refill  . aspirin 81 MG chewable tablet Chew 1 tablet (81 mg total) by mouth daily. 30 tablet 11  . atorvastatin (LIPITOR) 40 MG tablet Take 1 tablet (40 mg total) by mouth daily at 6 PM. 30 tablet 11  . clopidogrel (PLAVIX) 75 MG tablet Take 1 tablet (75 mg total) by mouth daily with breakfast. 30 tablet 11  . albuterol (PROVENTIL HFA;VENTOLIN HFA) 108 (90 Base) MCG/ACT inhaler Inhale 1-2 puffs into the lungs every 6 (six) hours as needed for wheezing or shortness of breath.    Marland Kitchen amLODipine (NORVASC) 2.5 MG tablet Take 1 tablet (2.5 mg total) by mouth daily. 30 tablet 1  . carvedilol (COREG) 6.25 MG tablet Take 1 tablet (6.25 mg total) by mouth 2 (two) times daily with a meal. 60 tablet 2   No facility-administered medications prior to visit.      Allergies:   Codeine; Dilaudid [hydromorphone hcl]; and Penicillins   Social History   Social History  . Marital status: Married    Spouse name: N/A  . Number of children: N/A  . Years of education: N/A   Occupational History  . clerical work    Social History Main Topics  . Smoking status: Current Every Day Smoker    Packs/day: 1.00    Years: 25.00    Types: Cigarettes  . Smokeless tobacco: Never Used  . Alcohol use No  .  Drug use:     Types: Marijuana     Comment: nightly  . Sexual activity: Not Asked   Other Topics Concern  . None   Social History Narrative  . None     Family History:  The patient'sfamily history includes Alcohol abuse (age of onset: 73) in her father; Congestive Heart Failure (age of onset: 18) in her mother.     ROS:   Please see the history of present illness.    ROS All other systems reviewed and are negative.   PHYSICAL EXAM:   VS:  BP (!) 150/82   Pulse 85   Ht 5\' 4"  (1.626 m)   Wt 160 lb (72.6 kg)   LMP 08/26/2015   SpO2 97%   BMI 27.46 kg/m    GEN: Well nourished, well developed, in no acute distress  HEENT: normal  Neck: no JVD, carotid bruits, or  masses Cardiac: RRR; no murmurs, rubs, or gallops,no edema  Respiratory:  clear to auscultation bilaterally, normal work of breathing GI: soft, nontender, nondistended, + BS MS: no deformity or atrophy  Skin: warm and dry, no rash Neuro:  Alert and Oriented x 3, Strength and sensation are intact Psych: euthymic mood, full affect  Wt Readings from Last 3 Encounters:  12/09/15 160 lb (72.6 kg)  12/09/15 160 lb (72.6 kg)  12/02/15 160 lb (72.6 kg)      Studies/Labs Reviewed:   EKG:  EKG is ordered today.  The ekg ordered today demonstrates NSR HR 82  Recent Labs: 11/27/2015: TSH 2.320 11/29/2015: ALT 11; BUN 8; Creatinine, Ser 0.94; Hemoglobin 12.6; Platelets 225; Potassium 3.9; Sodium 136   Lipid Panel    Component Value Date/Time   CHOL 218 (H) 11/27/2015 0453   TRIG 331 (H) 11/27/2015 0453   HDL 28 (L) 11/27/2015 0453   CHOLHDL 7.8 11/27/2015 0453   VLDL 66 (H) 11/27/2015 0453   LDLCALC 124 (H) 11/27/2015 0453    Additional studies/ records that were reviewed today include:  Procedures 11/28/15 Coronary Stent Intervention  Left Heart Cath and Coronary Angiography  Conclusion    Mid LAD lesion, 90 %stenosed. A STENT SYNERGY DES 3X12 drug eluting stent was successfully placed.  Post intervention, there is a 0% residual stenosis  The left ventricular systolic function is normal.The left ventricular ejection fraction is 55-65% by visual estimate.  LV end diastolic pressure is normal.  There is no aortic valve stenosis.  Of note, she had radial artery vasospasm. The PCI was performed through a 5 Fr guide catheter. Continue dual antiplatelet therapy for at least a year. Continue aggressive secondary prevention including smoking cessation.     ASSESSMENT & PLAN:   CAD: recent NSTEMI s/p DES to LAD. Continue ASA/plavix, statin and BB. Will add ACE in the setting of ACS and give Rx for SL NTG.  HTN: BP mildly elevated today on current regimen. Will increase Coreg  6.125mg  BID --> 12.5 mg BID (HR in 80s) and add Lisinopril 5 mg daily in the setting recent ACS. Will stop amlodipine 2.5 mg BID.   HLD: continue statin   Tobacco abuse: still smoking and not ready to quit. Not interested in NRT.   Exertional R LE pain: will screen for PAD with LE arterial dopplers.    Medication Adjustments/Labs and Tests Ordered: Current medicines are reviewed at length with the patient today.  Concerns regarding medicines are outlined above.  Medication changes, Labs and Tests ordered today are listed in the Patient Instructions below.  Patient Instructions  Medication Instructions:  Your physician has recommended you make the following change in your medication:  1.  STOP Amlodipine 2.  INCREASE the Coreg to 12.5 mg taking 1 tablet twice a day 3.  Start Lisinopril 5 mg taking 1 tablet daily 4.  START Nitroglycerin 0.4 s/l tablet USE AS DIRECTED ON THE BOTTLE   Labwork: None ordered  Testing/Procedures: Your physician has requested that you have a lower extremity arterial exercise duplex. During this test, exercise and ultrasound are used to evaluate arterial blood flow in the legs. Allow one hour for this exam. There are no restrictions or special instructions.  Follow-Up: Your physician recommends that you schedule a follow-up appointment in: 3 MONTHS WITH DR. Marlou Porch   Any Other Special Instructions Will Be Listed Below (If Applicable).     If you need a refill on your cardiac medications before your next appointment, please call your pharmacy.      Signed, Angelena Form, PA-C  12/09/2015 3:54 PM    [facility] [place], [place], [place]  [address] Phone: [phone]; Fax: [phone]

## 2015-12-09 ENCOUNTER — Ambulatory Visit (INDEPENDENT_AMBULATORY_CARE_PROVIDER_SITE_OTHER): Payer: Self-pay | Admitting: Pharmacist

## 2015-12-09 ENCOUNTER — Ambulatory Visit (INDEPENDENT_AMBULATORY_CARE_PROVIDER_SITE_OTHER): Payer: Self-pay | Admitting: Physician Assistant

## 2015-12-09 ENCOUNTER — Encounter: Payer: Self-pay | Admitting: Pharmacist

## 2015-12-09 ENCOUNTER — Encounter: Payer: Self-pay | Admitting: Physician Assistant

## 2015-12-09 VITALS — BP 150/82 | HR 85 | Ht 64.0 in | Wt 160.0 lb

## 2015-12-09 DIAGNOSIS — I2510 Atherosclerotic heart disease of native coronary artery without angina pectoris: Secondary | ICD-10-CM

## 2015-12-09 DIAGNOSIS — Z79899 Other long term (current) drug therapy: Secondary | ICD-10-CM | POA: Insufficient documentation

## 2015-12-09 DIAGNOSIS — M79604 Pain in right leg: Secondary | ICD-10-CM

## 2015-12-09 DIAGNOSIS — I200 Unstable angina: Secondary | ICD-10-CM

## 2015-12-09 DIAGNOSIS — E785 Hyperlipidemia, unspecified: Secondary | ICD-10-CM

## 2015-12-09 DIAGNOSIS — I10 Essential (primary) hypertension: Secondary | ICD-10-CM

## 2015-12-09 DIAGNOSIS — M79605 Pain in left leg: Secondary | ICD-10-CM

## 2015-12-09 DIAGNOSIS — Z9861 Coronary angioplasty status: Secondary | ICD-10-CM

## 2015-12-09 DIAGNOSIS — Z720 Tobacco use: Secondary | ICD-10-CM

## 2015-12-09 MED ORDER — CARVEDILOL 12.5 MG PO TABS: 12.5000 mg | ORAL_TABLET | Freq: Two times a day (BID) | ORAL | 3 refills | Status: DC

## 2015-12-09 MED ORDER — LISINOPRIL 5 MG PO TABS: 5.0000 mg | ORAL_TABLET | Freq: Every day | ORAL | 3 refills | Status: DC

## 2015-12-09 MED ORDER — NITROGLYCERIN 0.4 MG SL SUBL: 0.4000 mg | SUBLINGUAL_TABLET | SUBLINGUAL | 3 refills | Status: DC | PRN

## 2015-12-09 MED FILL — LISINOPRIL 5 MG TABLET: 5 | 30 days supply | Qty: 30 | Fill #0 | Status: TO

## 2015-12-09 MED FILL — ?CARVEDILOL 12.5 MG TABLET: 12.5 | 30 days supply | Qty: 60 | Fill #0

## 2015-12-09 MED FILL — NITROSTAT 0.4 MG TABLET SL: 0.4 | 25 days supply | Qty: 25 | Fill #0

## 2015-12-09 NOTE — Patient Instructions (Addendum)
Medication Instructions:  Your physician has recommended you make the following change in your medication:  1.  STOP Amlodipine 2.  INCREASE the Coreg to 12.5 mg taking 1 tablet twice a day 3.  Start Lisinopril 5 mg taking 1 tablet daily 4.  START Nitroglycerin 0.4 s/l tablet USE AS DIRECTED ON THE BOTTLE   Labwork: None ordered  Testing/Procedures: Your physician has requested that you have a lower extremity arterial exercise duplex. During this test, exercise and ultrasound are used to evaluate arterial blood flow in the legs. Allow one hour for this exam. There are no restrictions or special instructions.  Follow-Up: Your physician recommends that you schedule a follow-up appointment in: 3 MONTHS WITH DR. Marlou Porch   Any Other Special Instructions Will Be Listed Below (If Applicable).     If you need a refill on your cardiac medications before your next appointment, please call your pharmacy.

## 2015-12-19 ENCOUNTER — Other Ambulatory Visit: Payer: Self-pay | Admitting: Physician Assistant

## 2015-12-19 DIAGNOSIS — M79605 Pain in left leg: Principal | ICD-10-CM

## 2015-12-19 DIAGNOSIS — M79604 Pain in right leg: Secondary | ICD-10-CM

## 2015-12-21 ENCOUNTER — Ambulatory Visit (HOSPITAL_COMMUNITY)
Admission: RE | Admit: 2015-12-21 | Discharge: 2015-12-21 | Disposition: A | Discharge status: Home / Self Care | Payer: Self-pay | Source: Ambulatory Visit | Attending: Cardiovascular Disease | Admitting: Cardiovascular Disease

## 2015-12-21 ENCOUNTER — Inpatient Hospital Stay (HOSPITAL_COMMUNITY): Admission: RE | Admit: 2015-12-21 | Payer: Self-pay | Source: Ambulatory Visit

## 2015-12-21 DIAGNOSIS — M79604 Pain in right leg: Secondary | ICD-10-CM | POA: Insufficient documentation

## 2015-12-21 DIAGNOSIS — I2510 Atherosclerotic heart disease of native coronary artery without angina pectoris: Secondary | ICD-10-CM | POA: Insufficient documentation

## 2015-12-21 DIAGNOSIS — I10 Essential (primary) hypertension: Secondary | ICD-10-CM | POA: Insufficient documentation

## 2015-12-21 DIAGNOSIS — E785 Hyperlipidemia, unspecified: Secondary | ICD-10-CM | POA: Insufficient documentation

## 2015-12-21 DIAGNOSIS — M79605 Pain in left leg: Secondary | ICD-10-CM

## 2015-12-21 DIAGNOSIS — R938 Abnormal findings on diagnostic imaging of other specified body structures: Secondary | ICD-10-CM | POA: Insufficient documentation

## 2015-12-21 DIAGNOSIS — I708 Atherosclerosis of other arteries: Secondary | ICD-10-CM | POA: Insufficient documentation

## 2015-12-21 DIAGNOSIS — Z720 Tobacco use: Secondary | ICD-10-CM | POA: Insufficient documentation

## 2015-12-21 DIAGNOSIS — I700 Atherosclerosis of aorta: Secondary | ICD-10-CM | POA: Insufficient documentation

## 2015-12-21 DIAGNOSIS — I739 Peripheral vascular disease, unspecified: Secondary | ICD-10-CM | POA: Insufficient documentation

## 2015-12-27 ENCOUNTER — Telehealth: Payer: Self-pay | Admitting: *Deleted

## 2015-12-27 ENCOUNTER — Ambulatory Visit (INDEPENDENT_AMBULATORY_CARE_PROVIDER_SITE_OTHER): Payer: Self-pay | Admitting: Cardiovascular Disease

## 2015-12-27 ENCOUNTER — Encounter: Payer: Self-pay | Admitting: Cardiovascular Disease

## 2015-12-27 VITALS — BP 110/70 | HR 76 | Ht 64.0 in | Wt 156.8 lb

## 2015-12-27 DIAGNOSIS — F329 Major depressive disorder, single episode, unspecified: Secondary | ICD-10-CM

## 2015-12-27 DIAGNOSIS — E785 Hyperlipidemia, unspecified: Secondary | ICD-10-CM

## 2015-12-27 DIAGNOSIS — I10 Essential (primary) hypertension: Secondary | ICD-10-CM

## 2015-12-27 DIAGNOSIS — F32A Depression, unspecified: Secondary | ICD-10-CM

## 2015-12-27 DIAGNOSIS — I739 Peripheral vascular disease, unspecified: Secondary | ICD-10-CM

## 2015-12-27 MED ORDER — SERTRALINE HCL 50 MG PO TABS: 50.0000 mg | ORAL_TABLET | Freq: Every day | ORAL | 2 refills | Status: DC

## 2015-12-27 MED FILL — SERTRALINE HCL 50 MG TABLET: 50 | 30 days supply | Qty: 30 | Fill #0

## 2015-12-27 MED FILL — ATORVASTATIN 40 MG TABLET: 40 | 30 days supply | Qty: 30 | Fill #1

## 2015-12-27 NOTE — Patient Instructions (Signed)
Medication Instructions:  Your physician has recommended you make the following change in your medication:  1. START Sertraline 50mg  take one tablet by mouth daily  Labwork: Your physician recommends that you have lab work today: BMP, CBC and PT/INR  Testing/Procedures: Your physician has requested that you have a peripheral vascular angiogram. This exam is performed at the hospital. During this exam IV contrast is used to look at arterial blood flow. Please review the information sheet given for details.  Follow-Up: Your physician recommends that you schedule a follow-up appointment in: 1 MONTH with Dr Fletcher Anon   Any Other Special Instructions Will Be Listed Below (If Applicable).     If you need a refill on your cardiac medications before your next appointment, please call your pharmacy.

## 2015-12-27 NOTE — Telephone Encounter (Signed)
The pt is scheduled for 10:00 case on 01/04/16.  The pt can have her labs drawn at the hospital. I spoke with the pt and made her aware of this information.

## 2015-12-27 NOTE — Telephone Encounter (Signed)
Received call from [facility] lab. Spoke to technician who informs me patient presented for preprocedure labwork, but she notes Randell Loop would have to collect payment prior to services. Patient is uninsured and in process of getting the [employer] for [employer]. Noted she will likely need to have the labs drawn in hospital since off-site labs won't take the card. Advised Raianne to send patient home, will notify Dr. Tyrell Antonio nurse of situation and see what can be arranged and reach patient at home. Pt should have orange card in time for procedure. Can she get labwork done at [facility]?

## 2015-12-27 NOTE — Progress Notes (Signed)
Cardiology Office Note   Date:  12/27/2015   ID:  ALYSABETH HIRSCH, DOB 11/09/1965, MRN [id number]  PCP:  Brayton Caves, PA-C  Cardiologist:  Dr. Marlou Porch  No chief complaint on file.     History of Present Illness: Robin Walsh is a 50 y.o. female who presents for evaluation and management of peripheral arterial disease. She has known history of coronary artery disease, hypertension, hyperlipidemia and tobacco use. She presented recently with unstable angina. Cardiac catheterization showed 90% mid LAD stenosis which was treated successfully by PCI and drug-eluting stent placement. Ejection fraction was normal. She tried to increase her physical activities after her stent placement but was limited by bilateral leg pain worse on the right side. She underwent noninvasive vascular evaluation which showed mildly reduced ABI on the right side and normal on the left. Duplex showed significant bilateral common iliac artery stenosis worse on the right side. She reports severe right leg pain with walking which started about 2 years ago and has been worsening. It starts in the hip area and sometimes in the lower back and recently has been affecting the whole right leg. She denies any symptoms on the left side. The pain usually happens after walking less than one block or going up 15 steps. She tried to increase her physical activities and has not been successful. She is trying to quit smoking. She has been under significant stress since her cardiac stent and she reports significant anxiety.    Past Medical History:  Diagnosis Date  . Hyperlipidemia   . Hypertension     Past Surgical History:  Procedure Laterality Date  . CARDIAC CATHETERIZATION N/A 11/28/2015   Procedure: Left Heart Cath and Coronary Angiography;  Surgeon: Jettie Booze, MD;  Location: [facility];  Service: Cardiovascular;  Laterality: N/A;  . CARDIAC CATHETERIZATION N/A 11/28/2015   Procedure: Coronary  Stent Intervention;  Surgeon: Jettie Booze, MD;  Location: [facility];  Service: Cardiovascular;  Laterality: N/A;  DES to Mid LAD; 3.0x12 Synergy  . LEG SURGERY  2002?   tibial plateau fracture     Current Outpatient Prescriptions  Medication Sig Dispense Refill  . aspirin 81 MG chewable tablet Chew 1 tablet (81 mg total) by mouth daily. 30 tablet 11  . atorvastatin (LIPITOR) 40 MG tablet Take 1 tablet (40 mg total) by mouth daily at 6 PM. 30 tablet 11  . carvedilol (COREG) 12.5 MG tablet Take 1 tablet (12.5 mg total) by mouth 2 (two) times daily. 180 tablet 3  . clopidogrel (PLAVIX) 75 MG tablet Take 1 tablet (75 mg total) by mouth daily with breakfast. 30 tablet 11  . lisinopril (PRINIVIL,ZESTRIL) 5 MG tablet Take 1 tablet (5 mg total) by mouth daily. 90 tablet 3  . nitroGLYCERIN (NITROSTAT) 0.4 MG SL tablet Place 1 tablet (0.4 mg total) under the tongue every 5 (five) minutes as needed for chest pain. 25 tablet 3  . sertraline (ZOLOFT) 50 MG tablet Take 1 tablet (50 mg total) by mouth daily. 90 tablet 2   No current facility-administered medications for this visit.     Allergies:   Codeine; Dilaudid [hydromorphone hcl]; and Penicillins    Social History:  The patient  reports that she has been smoking Cigarettes.  She has a 25.00 pack-year smoking history. She has never used smokeless tobacco. She reports that she uses drugs, including Marijuana. She reports that she does not drink alcohol.   Family History:  The patient's family history includes Alcohol abuse (age of onset: 71) in her father; Congestive Heart Failure (age of onset: 27) in her mother.    ROS:  Please see the history of present illness.   Otherwise, review of systems are positive for none.   All other systems are reviewed and negative.    PHYSICAL EXAM: VS:  BP 110/70   Pulse 76   Ht 5\' 4"  (1.626 m)   Wt 156 lb 12.8 oz (71.1 kg)   LMP 08/26/2015   BMI 26.91 kg/m  , BMI Body mass index is 26.91  kg/m. GEN: Well nourished, well developed, in no acute distress  HEENT: normal  Neck: no JVD, carotid bruits, or masses Cardiac: RRR; no murmurs, rubs, or gallops,no edema  Respiratory:  clear to auscultation bilaterally, normal work of breathing GI: soft, nontender, nondistended, + BS MS: no deformity or atrophy  Skin: warm and dry, no rash Neuro:  Strength and sensation are intact Psych: euthymic mood, full affect Vascular: Femoral pulses +1 on the right and +2 on the left. Distal pulses are palpable on the left but not the right.   EKG:  EKG is not ordered today.   Recent Labs: 11/27/2015: TSH 2.320 11/29/2015: ALT 11; BUN 8; Creatinine, Ser 0.94; Hemoglobin 12.6; Platelets 225; Potassium 3.9; Sodium 136    Lipid Panel    Component Value Date/Time   CHOL 218 (H) 11/27/2015 0453   TRIG 331 (H) 11/27/2015 0453   HDL 28 (L) 11/27/2015 0453   CHOLHDL 7.8 11/27/2015 0453   VLDL 66 (H) 11/27/2015 0453   LDLCALC 124 (H) 11/27/2015 0453      Wt Readings from Last 3 Encounters:  12/27/15 156 lb 12.8 oz (71.1 kg)  12/09/15 160 lb (72.6 kg)  12/09/15 160 lb (72.6 kg)       No flowsheet data found.    ASSESSMENT AND PLAN:  1.  Peripheral arterial disease: Severe right leg claudication (Rutherford class III) due to severe right common iliac artery stenosis. She also has borderline significant left common iliac artery stenosis. Most likely, she is not symptomatic on the left side due to worse disease affecting the right leg. I discussed with her different management options including attempting medical therapy, walking program and smoking cessation versus proceeding with angiography and possible endovascular intervention. She attempted to increase her physical activities but has been extremely limited and was not able to. Given the severity of her symptoms, I have recommended proceeding with angiography and possible endovascular intervention. She is already on dual antiplatelet  therapy.  2. Tobacco use: I discussed with her the importance of smoking cessation.  3. Coronary artery disease involving native coronary arteries with other forms of angina: She reports significant improvement in symptoms after stent placement to the LAD although she continues to have intermittent chest pain especially when she is under stress.  4. Hyperlipidemia: Continue treatment with atorvastatin.  5. Anxiety and depression: The patient seems to be tearful and has been under significant stress since her cardiac stent. I started her on sertraline 50 mg once daily.    Disposition:   FU with me in 1 month  Signed,  [name], MD  12/27/2015 11:40 AM    [place]

## 2015-12-28 MED FILL — CLOPIDOGREL 75 MG TABLET: 75 | 30 days supply | Qty: 30 | Fill #1

## 2016-01-04 ENCOUNTER — Encounter (HOSPITAL_COMMUNITY)
Admission: RE | Disposition: A | Discharge status: Home / Self Care | Payer: Self-pay | Source: Ambulatory Visit | Attending: Cardiovascular Disease

## 2016-01-04 ENCOUNTER — Ambulatory Visit (HOSPITAL_COMMUNITY)
Admission: RE | Admit: 2016-01-04 | Discharge: 2016-01-04 | Disposition: A | Discharge status: Home / Self Care | Payer: Self-pay | Source: Ambulatory Visit | Attending: Cardiovascular Disease | Admitting: Cardiovascular Disease

## 2016-01-04 DIAGNOSIS — I10 Essential (primary) hypertension: Secondary | ICD-10-CM | POA: Insufficient documentation

## 2016-01-04 DIAGNOSIS — Z7902 Long term (current) use of antithrombotics/antiplatelets: Secondary | ICD-10-CM | POA: Insufficient documentation

## 2016-01-04 DIAGNOSIS — F418 Other specified anxiety disorders: Secondary | ICD-10-CM | POA: Insufficient documentation

## 2016-01-04 DIAGNOSIS — Z955 Presence of coronary angioplasty implant and graft: Secondary | ICD-10-CM | POA: Insufficient documentation

## 2016-01-04 DIAGNOSIS — Z7982 Long term (current) use of aspirin: Secondary | ICD-10-CM | POA: Insufficient documentation

## 2016-01-04 DIAGNOSIS — I70213 Atherosclerosis of native arteries of extremities with intermittent claudication, bilateral legs: Secondary | ICD-10-CM

## 2016-01-04 DIAGNOSIS — I700 Atherosclerosis of aorta: Secondary | ICD-10-CM | POA: Insufficient documentation

## 2016-01-04 DIAGNOSIS — E785 Hyperlipidemia, unspecified: Secondary | ICD-10-CM | POA: Insufficient documentation

## 2016-01-04 DIAGNOSIS — I739 Peripheral vascular disease, unspecified: Secondary | ICD-10-CM | POA: Diagnosis present

## 2016-01-04 DIAGNOSIS — Z8249 Family history of ischemic heart disease and other diseases of the circulatory system: Secondary | ICD-10-CM | POA: Insufficient documentation

## 2016-01-04 DIAGNOSIS — I2510 Atherosclerotic heart disease of native coronary artery without angina pectoris: Secondary | ICD-10-CM | POA: Insufficient documentation

## 2016-01-04 DIAGNOSIS — F17210 Nicotine dependence, cigarettes, uncomplicated: Secondary | ICD-10-CM | POA: Insufficient documentation

## 2016-01-04 HISTORY — PX: PERIPHERAL VASCULAR CATHETERIZATION: SHX172C

## 2016-01-04 LAB — BASIC METABOLIC PANEL
Anion gap: 10 (ref 5–15)
BUN: 9 mg/dL (ref 6–20)
CALCIUM: 9.7 mg/dL (ref 8.9–10.3)
CO2: 23 mmol/L (ref 22–32)
CREATININE: 1.06 mg/dL — AB (ref 0.44–1.00)
Chloride: 105 mmol/L (ref 101–111)
GFR calc Af Amer: >60 mL/min (ref >60)
GLUCOSE: 112 mg/dL — AB (ref 65–99)
Potassium: 3.9 mmol/L (ref 3.5–5.1)
Sodium: 138 mmol/L (ref 135–145)

## 2016-01-04 LAB — POCT ACTIVATED CLOTTING TIME
Activated Clotting Time: 219 seconds
Activated Clotting Time: 285 seconds
Activated Clotting Time: 186 seconds
Activated Clotting Time: 164 seconds

## 2016-01-04 LAB — CBC
HCT: 44.7 % (ref 36.0–46.0)
Hemoglobin: 14.4 g/dL (ref 12.0–15.0)
MCH: 31.1 pg (ref 26.0–34.0)
MCHC: 32.2 g/dL (ref 30.0–36.0)
MCV: 96.5 fL (ref 78.0–100.0)
Platelets: 288 10*3/uL (ref 150–400)
RBC: 4.63 MIL/uL (ref 3.87–5.11)
RDW: 12.4 % (ref 11.5–15.5)
WBC: 8.3 10*3/uL (ref 4.0–10.5)

## 2016-01-04 LAB — PROTIME-INR
INR: 1.19
PROTHROMBIN TIME: 15.2 s (ref 11.4–15.2)

## 2016-01-04 LAB — HCG, SERUM, QUALITATIVE: Preg, Serum: NEGATIVE

## 2016-01-04 SURGERY — ABDOMINAL AORTOGRAM

## 2016-01-04 MED ORDER — SODIUM CHLORIDE 0.9 % WEIGHT BASED INFUSION: 1.0000 mL/kg/h | INTRAVENOUS | Status: DC

## 2016-01-04 MED ORDER — ACETAMINOPHEN 325 MG PO TABS
650.0000 mg | ORAL_TABLET | ORAL | Status: DC | PRN
  Administered 2016-01-04: 650 mg via ORAL
  Filled 2016-01-04: qty 2

## 2016-01-04 MED ORDER — SODIUM CHLORIDE 0.9 % IV SOLN: INTRAVENOUS | Status: AC

## 2016-01-04 MED ORDER — MIDAZOLAM HCL 2 MG/2ML IJ SOLN
INTRAMUSCULAR | Status: DC | PRN
  Administered 2016-01-04 (×2): 1 mg via INTRAVENOUS

## 2016-01-04 MED ORDER — FENTANYL CITRATE (PF) 100 MCG/2ML IJ SOLN
INTRAMUSCULAR | Status: DC | PRN
  Administered 2016-01-04 (×2): 50 ug via INTRAVENOUS

## 2016-01-04 MED ORDER — SODIUM CHLORIDE 0.9 % IV SOLN: 250.0000 mL | INTRAVENOUS | Status: DC | PRN

## 2016-01-04 MED ORDER — IODIXANOL 320 MG/ML IV SOLN
INTRAVENOUS | Status: DC | PRN
  Administered 2016-01-04: 165 mL via INTRAVENOUS

## 2016-01-04 MED ORDER — SODIUM CHLORIDE 0.9% FLUSH: 3.0000 mL | INTRAVENOUS | Status: DC | PRN

## 2016-01-04 MED ORDER — HEPARIN SODIUM (PORCINE) 1000 UNIT/ML IJ SOLN
INTRAMUSCULAR | Status: DC | PRN
  Administered 2016-01-04: 2000 [IU] via INTRAVENOUS
  Administered 2016-01-04: 6000 [IU] via INTRAVENOUS

## 2016-01-04 MED ORDER — SODIUM CHLORIDE 0.9% FLUSH: 3.0000 mL | Freq: Two times a day (BID) | INTRAVENOUS | Status: DC

## 2016-01-04 MED ORDER — ASPIRIN 81 MG PO CHEW: 81.0000 mg | CHEWABLE_TABLET | ORAL | Status: DC

## 2016-01-04 MED ORDER — SODIUM CHLORIDE 0.9 % WEIGHT BASED INFUSION
3.0000 mL/kg/h | INTRAVENOUS | Status: DC
  Administered 2016-01-04: 3 mL/kg/h via INTRAVENOUS

## 2016-01-04 MED ORDER — HEPARIN SODIUM (PORCINE) 1000 UNIT/ML IJ SOLN
INTRAMUSCULAR | Status: AC
  Filled 2016-01-04: qty 1

## 2016-01-04 MED ORDER — LIDOCAINE HCL (PF) 1 % IJ SOLN
INTRAMUSCULAR | Status: AC
  Filled 2016-01-04: qty 30

## 2016-01-04 MED ORDER — HEPARIN (PORCINE) IN NACL 2-0.9 UNIT/ML-% IJ SOLN
INTRAMUSCULAR | Status: AC
  Filled 2016-01-04: qty 1000

## 2016-01-04 MED ORDER — MIDAZOLAM HCL 2 MG/2ML IJ SOLN
INTRAMUSCULAR | Status: AC
  Filled 2016-01-04: qty 2

## 2016-01-04 MED ORDER — ACETAMINOPHEN 325 MG PO TABS
ORAL_TABLET | ORAL | Status: AC
  Filled 2016-01-04: qty 2

## 2016-01-04 MED ORDER — LIDOCAINE HCL (PF) 1 % IJ SOLN
INTRAMUSCULAR | Status: DC | PRN
  Administered 2016-01-04: 12 mL
  Administered 2016-01-04: 10 mL

## 2016-01-04 MED ORDER — FENTANYL CITRATE (PF) 100 MCG/2ML IJ SOLN
INTRAMUSCULAR | Status: AC
  Filled 2016-01-04: qty 2

## 2016-01-04 MED ORDER — HEPARIN (PORCINE) IN NACL 2-0.9 UNIT/ML-% IJ SOLN
INTRAMUSCULAR | Status: DC | PRN
  Administered 2016-01-04: 1000 mL via INTRA_ARTERIAL

## 2016-01-04 SURGICAL SUPPLY — 17 items
CATH ANGIO 5F PIGTAIL 65CM (CATHETERS) ×4 IMPLANT
DEVICE CLOSURE MYNXGRIP 6/7F (Vascular Products) ×4 IMPLANT
KIT ENCORE 26 ADVANTAGE (KITS) ×8 IMPLANT
KIT MICROINTRODUCER STIFF 5F (SHEATH) ×8 IMPLANT
KIT PV (KITS) ×4 IMPLANT
SHEATH BRITE TIP 6FR 35CM (SHEATH) ×4 IMPLANT
SHEATH BRITE TIP 7FR 35CM (SHEATH) ×4 IMPLANT
SHEATH PINNACLE 5F 10CM (SHEATH) ×4 IMPLANT
SHEATH PINNACLE 6F 10CM (SHEATH) ×4 IMPLANT
SHEATH PINNACLE 7F 10CM (SHEATH) ×4 IMPLANT
STENT EXPRESS LD 7X27X75 (Permanent Stent) ×8 IMPLANT
STOPCOCK MORSE 400PSI 3WAY (MISCELLANEOUS) ×4 IMPLANT
SYRINGE MEDRAD AVANTA MACH 7 (SYRINGE) ×4 IMPLANT
TRANSDUCER W/STOPCOCK (MISCELLANEOUS) ×4 IMPLANT
TRAY PV CATH (CUSTOM PROCEDURE TRAY) ×4 IMPLANT
TUBING CIL FLEX 10 FLL-RA (TUBING) ×4 IMPLANT
WIRE HITORQ VERSACORE ST 145CM (WIRE) ×8 IMPLANT

## 2016-01-04 NOTE — H&P (View-Only) (Signed)
Cardiology Office Note   Date:  12/27/2015   ID:  Robin Walsh, DOB [birth year], MRN [id number]  PCP:  Brayton Caves, PA-C  Cardiologist:  Dr. Marlou Porch  No chief complaint on file.     History of Present Illness: Robin Walsh is a 50 y.o. female who presents for evaluation and management of peripheral arterial disease. She has known history of coronary artery disease, hypertension, hyperlipidemia and tobacco use. She presented recently with unstable angina. Cardiac catheterization showed 90% mid LAD stenosis which was treated successfully by PCI and drug-eluting stent placement. Ejection fraction was normal. She tried to increase her physical activities after her stent placement but was limited by bilateral leg pain worse on the right side. She underwent noninvasive vascular evaluation which showed mildly reduced ABI on the right side and normal on the left. Duplex showed significant bilateral common iliac artery stenosis worse on the right side. She reports severe right leg pain with walking which started about 2 years ago and has been worsening. It starts in the hip area and sometimes in the lower back and recently has been affecting the whole right leg. She denies any symptoms on the left side. The pain usually happens after walking less than one block or going up 15 steps. She tried to increase her physical activities and has not been successful. She is trying to quit smoking. She has been under significant stress since her cardiac stent and she reports significant anxiety.    Past Medical History:  Diagnosis Date  . Hyperlipidemia   . Hypertension     Past Surgical History:  Procedure Laterality Date  . CARDIAC CATHETERIZATION N/A 11/28/2015   Procedure: Left Heart Cath and Coronary Angiography;  Surgeon: Jettie Booze, MD;  Location: [facility];  Service: Cardiovascular;  Laterality: N/A;  . CARDIAC CATHETERIZATION N/A 11/28/2015   Procedure: Coronary  Stent Intervention;  Surgeon: Jettie Booze, MD;  Location: [facility];  Service: Cardiovascular;  Laterality: N/A;  DES to Mid LAD; 3.0x12 Synergy  . LEG SURGERY  2002?   tibial plateau fracture     Current Outpatient Prescriptions  Medication Sig Dispense Refill  . aspirin 81 MG chewable tablet Chew 1 tablet (81 mg total) by mouth daily. 30 tablet 11  . atorvastatin (LIPITOR) 40 MG tablet Take 1 tablet (40 mg total) by mouth daily at 6 PM. 30 tablet 11  . carvedilol (COREG) 12.5 MG tablet Take 1 tablet (12.5 mg total) by mouth 2 (two) times daily. 180 tablet 3  . clopidogrel (PLAVIX) 75 MG tablet Take 1 tablet (75 mg total) by mouth daily with breakfast. 30 tablet 11  . lisinopril (PRINIVIL,ZESTRIL) 5 MG tablet Take 1 tablet (5 mg total) by mouth daily. 90 tablet 3  . nitroGLYCERIN (NITROSTAT) 0.4 MG SL tablet Place 1 tablet (0.4 mg total) under the tongue every 5 (five) minutes as needed for chest pain. 25 tablet 3  . sertraline (ZOLOFT) 50 MG tablet Take 1 tablet (50 mg total) by mouth daily. 90 tablet 2   No current facility-administered medications for this visit.     Allergies:   Codeine; Dilaudid [hydromorphone hcl]; and Penicillins    Social History:  The patient  reports that she has been smoking Cigarettes.  She has a 25.00 pack-year smoking history. She has never used smokeless tobacco. She reports that she uses drugs, including Marijuana. She reports that she does not drink alcohol.   Family History:  The patient's family history includes Alcohol abuse (age of onset: 80) in her father; Congestive Heart Failure (age of onset: 23) in her mother.    ROS:  Please see the history of present illness.   Otherwise, review of systems are positive for none.   All other systems are reviewed and negative.    PHYSICAL EXAM: VS:  BP 110/70   Pulse 76   Ht 5\' 4"  (1.626 m)   Wt 156 lb 12.8 oz (71.1 kg)   LMP 08/26/2015   BMI 26.91 kg/m  , BMI Body mass index is 26.91  kg/m. GEN: Well nourished, well developed, in no acute distress  HEENT: normal  Neck: no JVD, carotid bruits, or masses Cardiac: RRR; no murmurs, rubs, or gallops,no edema  Respiratory:  clear to auscultation bilaterally, normal work of breathing GI: soft, nontender, nondistended, + BS MS: no deformity or atrophy  Skin: warm and dry, no rash Neuro:  Strength and sensation are intact Psych: euthymic mood, full affect Vascular: Femoral pulses +1 on the right and +2 on the left. Distal pulses are palpable on the left but not the right.   EKG:  EKG is not ordered today.   Recent Labs: 11/27/2015: TSH 2.320 11/29/2015: ALT 11; BUN 8; Creatinine, Ser 0.94; Hemoglobin 12.6; Platelets 225; Potassium 3.9; Sodium 136    Lipid Panel    Component Value Date/Time   CHOL 218 (H) 11/27/2015 0453   TRIG 331 (H) 11/27/2015 0453   HDL 28 (L) 11/27/2015 0453   CHOLHDL 7.8 11/27/2015 0453   VLDL 66 (H) 11/27/2015 0453   LDLCALC 124 (H) 11/27/2015 0453      Wt Readings from Last 3 Encounters:  12/27/15 156 lb 12.8 oz (71.1 kg)  12/09/15 160 lb (72.6 kg)  12/09/15 160 lb (72.6 kg)       No flowsheet data found.    ASSESSMENT AND PLAN:  1.  Peripheral arterial disease: Severe right leg claudication (Rutherford class III) due to severe right common iliac artery stenosis. She also has borderline significant left common iliac artery stenosis. Most likely, she is not symptomatic on the left side due to worse disease affecting the right leg. I discussed with her different management options including attempting medical therapy, walking program and smoking cessation versus proceeding with angiography and possible endovascular intervention. She attempted to increase her physical activities but has been extremely limited and was not able to. Given the severity of her symptoms, I have recommended proceeding with angiography and possible endovascular intervention. She is already on dual antiplatelet  therapy.  2. Tobacco use: I discussed with her the importance of smoking cessation.  3. Coronary artery disease involving native coronary arteries with other forms of angina: She reports significant improvement in symptoms after stent placement to the LAD although she continues to have intermittent chest pain especially when she is under stress.  4. Hyperlipidemia: Continue treatment with atorvastatin.  5. Anxiety and depression: The patient seems to be tearful and has been under significant stress since her cardiac stent. I started her on sertraline 50 mg once daily.    Disposition:   FU with me in 1 month  Signed,  [name], MD  12/27/2015 11:40 AM    [place]

## 2016-01-04 NOTE — Progress Notes (Signed)
Site area: RFA Site Prior to Removal:  Level 0 Pressure Applied For:74min Manual:  yes  Patient Status During Pull: stable  Post Pull Site:  Level Post Pull Instructions Given:  yes Post Pull Pulses Present: palpable Dressing Applied:tegaderm   Bedrest begins @ 1430 till 1830 Comments:

## 2016-01-04 NOTE — Discharge Instructions (Signed)
Angiogram, Care After °Refer to this sheet in the next few weeks. These instructions provide you with information about caring for yourself after your procedure. Your health care provider may also give you more specific instructions. Your treatment has been planned according to current medical practices, but problems sometimes occur. Call your health care provider if you have any problems or questions after your procedure. °WHAT TO EXPECT AFTER THE PROCEDURE °After your procedure, it is typical to have the following: °· Bruising at the catheter insertion site that usually fades within 1-2 weeks. °· Blood collecting in the tissue (hematoma) that may be painful to the touch. It should usually decrease in size and tenderness within 1-2 weeks. °HOME CARE INSTRUCTIONS °· Take medicines only as directed by your health care provider. °· You may shower 24-48 hours after the procedure or as directed by your health care provider. Remove the bandage (dressing) and gently wash the site with plain soap and water. Pat the area dry with a clean towel. Do not rub the site, because this may cause bleeding. °· Do not take baths, swim, or use a hot tub until your health care provider approves. °· Check your insertion site every day for redness, swelling, or drainage. °· Do not apply powder or lotion to the site. °· Do not lift over 10 lb (4.5 kg) for 5 days after your procedure or as directed by your health care provider. °· Ask your health care provider when it is okay to: °¨ Return to work or school. °¨ Resume usual physical activities or sports. °¨ Resume sexual activity. °· Do not drive home if you are discharged the same day as the procedure. Have someone else drive you. °· You may drive 24 hours after the procedure unless otherwise instructed by your health care provider. °· Do not operate machinery or power tools for 24 hours after the procedure or as directed by your health care provider. °· If your procedure was done as an  outpatient procedure, which means that you went home the same day as your procedure, a responsible adult should be with you for the first 24 hours after you arrive home. °· Keep all follow-up visits as directed by your health care provider. This is important. °SEEK MEDICAL CARE IF: °· You have a fever. °· You have chills. °· You have increased bleeding from the catheter insertion site. Hold pressure on the site.  CALL 911 °SEEK IMMEDIATE MEDICAL CARE IF: °· You have unusual pain at the catheter insertion site. °· You have redness, warmth, or swelling at the catheter insertion site. °· You have drainage (other than a small amount of blood on the dressing) from the catheter insertion site. °· The catheter insertion site is bleeding, and the bleeding does not stop after 30 minutes of holding steady pressure on the site. °· The area near or just beyond the catheter insertion site becomes pale, cool, tingly, or numb. °  °This information is not intended to replace advice given to you by your health care provider. Make sure you discuss any questions you have with your health care provider. °  °Document Released: 10/12/2004 Document Revised: 04/16/2014 Document Reviewed: 08/27/2012 °Elsevier Interactive Patient Education ©2016 [employer] ° °

## 2016-01-04 NOTE — Interval H&P Note (Signed)
History and Physical Interval Note:  01/04/2016 10:18 AM  Robin Walsh  has presented today for surgery, with the diagnosis of pvd  The various methods of treatment have been discussed with the patient and family. After consideration of risks, benefits and other options for treatment, the patient has consented to  Abdominal aortogram and bilateral lower extremity runoff with possible percutaneous intervention as a surgical intervention .  The patient's history has been reviewed, patient examined, no change in status, stable for surgery.  I have reviewed the patient's chart and labs.  Questions were answered to the patient's satisfaction.     Robin Walsh

## 2016-01-05 ENCOUNTER — Encounter (HOSPITAL_COMMUNITY): Payer: Self-pay | Admitting: Internal Medicine

## 2016-01-10 ENCOUNTER — Telehealth: Payer: Self-pay | Admitting: Cardiology

## 2016-01-10 NOTE — Telephone Encounter (Signed)
Spoke with pt re: knot at procedure site.  She is reporting it is about the size of the end of her pinky finger. There is no drainage, oozing, warmth or redness.  She reports a migrating bruise moving toward her knee and is resolving. She states she has been trying to keep that leg more still than the other and feels as though it is a little colder sometimes.  She is asking about activity levels and was advised OK to walk but would wait to f/u p/h before more strenuous activities.  She states understanding.  She was grateful to me for answering her questions and helping to calm some of her anxiety AEB her verbal expression.  Encourage to c/b if further questions or concerns and dto f/u as scheduled.

## 2016-01-10 NOTE — Telephone Encounter (Signed)
New message    Pt calling indicating that she has swelling.   Pt c/o swelling: STAT is pt has developed SOB within 24 hours  1. How long have you been experiencing swelling? The day of surgery last Wednesday  2. Where is the swelling located? Left area  3.  Are you currently taking a "fluid pill"? No   4.  Are you currently SOB? no  5.  Have you traveled recently? Concerned about the knot

## 2016-01-13 ENCOUNTER — Other Ambulatory Visit: Payer: Self-pay | Admitting: Cardiovascular Disease

## 2016-01-13 DIAGNOSIS — Z95828 Presence of other vascular implants and grafts: Secondary | ICD-10-CM

## 2016-01-24 ENCOUNTER — Encounter: Payer: Self-pay | Admitting: Cardiovascular Disease

## 2016-01-24 ENCOUNTER — Ambulatory Visit (HOSPITAL_COMMUNITY)
Admission: RE | Admit: 2016-01-24 | Discharge: 2016-01-24 | Disposition: A | Discharge status: Home / Self Care | Payer: Self-pay | Source: Ambulatory Visit | Attending: Cardiovascular Disease | Admitting: Cardiovascular Disease

## 2016-01-24 ENCOUNTER — Ambulatory Visit (INDEPENDENT_AMBULATORY_CARE_PROVIDER_SITE_OTHER): Payer: Self-pay | Admitting: Cardiovascular Disease

## 2016-01-24 VITALS — BP 112/72 | HR 70 | Ht 64.0 in | Wt 157.4 lb

## 2016-01-24 DIAGNOSIS — Z95828 Presence of other vascular implants and grafts: Secondary | ICD-10-CM | POA: Insufficient documentation

## 2016-01-24 DIAGNOSIS — I700 Atherosclerosis of aorta: Secondary | ICD-10-CM | POA: Insufficient documentation

## 2016-01-24 DIAGNOSIS — Z720 Tobacco use: Secondary | ICD-10-CM

## 2016-01-24 DIAGNOSIS — I25119 Atherosclerotic heart disease of native coronary artery with unspecified angina pectoris: Secondary | ICD-10-CM

## 2016-01-24 DIAGNOSIS — I739 Peripheral vascular disease, unspecified: Secondary | ICD-10-CM

## 2016-01-24 DIAGNOSIS — E785 Hyperlipidemia, unspecified: Secondary | ICD-10-CM

## 2016-01-24 MED ORDER — CARVEDILOL 6.25 MG PO TABS: 6.2500 mg | ORAL_TABLET | Freq: Two times a day (BID) | ORAL | 3 refills | Status: DC

## 2016-01-24 NOTE — Progress Notes (Signed)
Cardiology Office Note   Date:  01/24/2016   ID:  Robin Walsh, DOB 05-13-1965, MRN [id number]  PCP:  Pcp Not In System  Cardiologist:  Dr. Marlou Porch  Chief Complaint  Patient presents with  . Follow-up    pt states she is not taking coreg everyday       History of Present Illness: Robin Walsh is a 50 y.o. female who presents for A follow-up visit regarding  peripheral arterial disease. She has known history of coronary artery disease, hypertension, hyperlipidemia and tobacco use. She presented recently with unstable angina. Cardiac catheterization showed 90% mid LAD stenosis which was treated successfully by PCI and drug-eluting stent placement. Ejection fraction was normal.  She was seen recently for severe right leg claudication. She underwent noninvasive vascular evaluation which showed mildly reduced ABI on the right side and normal on the left. Duplex showed significant bilateral common iliac artery stenosis worse on the right side. I proceeded with angiography which showed significant bilateral ostial common iliac artery stenosis worse on the right side with no significant outflow runoff disease. We then performed successful stenting of bilateral common iliac artery without complications. She had resolution of claudication since then. She complains of occasional substernal chest tightness which is not exertional. She has noted increased dizziness and relatively low blood pressure especially after she takes carvedilol. She continues to smoke half a pack per day.    Past Medical History:  Diagnosis Date  . CAD (coronary artery disease)   . Hyperlipidemia   . Hypertension     Past Surgical History:  Procedure Laterality Date  . CARDIAC CATHETERIZATION N/A 11/28/2015   Procedure: Left Heart Cath and Coronary Angiography;  Surgeon: Jettie Booze, MD;  Location: [facility];  Service: Cardiovascular;  Laterality: N/A;  . CARDIAC CATHETERIZATION N/A  11/28/2015   Procedure: Coronary Stent Intervention;  Surgeon: Jettie Booze, MD;  Location: [facility];  Service: Cardiovascular;  Laterality: N/A;  DES to Mid LAD; 3.0x12 Synergy  . LEG SURGERY  2002?   tibial plateau fracture  . PERIPHERAL VASCULAR CATHETERIZATION N/A 01/04/2016   Procedure: Abdominal Aortogram;  Surgeon: Nelva Bush, MD;  Location: [facility];  Service: Cardiovascular;  Laterality: N/A;  . PERIPHERAL VASCULAR CATHETERIZATION Bilateral 01/04/2016   Procedure: Lower Extremity Angiography;  Surgeon: Nelva Bush, MD;  Location: [facility];  Service: Cardiovascular;  Laterality: Bilateral;  . PERIPHERAL VASCULAR CATHETERIZATION Bilateral 01/04/2016   Procedure: Peripheral Vascular Intervention;  Surgeon: Nelva Bush, MD;  Location: [facility];  Service: Cardiovascular;  Laterality: Bilateral;  common iliac     Current Outpatient Prescriptions  Medication Sig Dispense Refill  . aspirin 81 MG chewable tablet Chew 1 tablet (81 mg total) by mouth daily. 30 tablet 11  . atorvastatin (LIPITOR) 40 MG tablet Take 1 tablet (40 mg total) by mouth daily at 6 PM. 30 tablet 11  . carvedilol (COREG) 12.5 MG tablet Take 1 tablet (12.5 mg total) by mouth 2 (two) times daily. 180 tablet 3  . clopidogrel (PLAVIX) 75 MG tablet Take 1 tablet (75 mg total) by mouth daily with breakfast. 30 tablet 11  . lisinopril (PRINIVIL,ZESTRIL) 5 MG tablet Take 1 tablet (5 mg total) by mouth daily. 90 tablet 3  . nitroGLYCERIN (NITROSTAT) 0.4 MG SL tablet Place 1 tablet (0.4 mg total) under the tongue every 5 (five) minutes as needed for chest pain. 25 tablet 3   No current facility-administered  medications for this visit.     Allergies:   Codeine; Dilaudid [hydromorphone hcl]; and Penicillins    Social History:  The patient  reports that she has been smoking Cigarettes.  She has a 25.00 pack-year smoking history. She has never used smokeless tobacco. She  reports that she uses drugs, including Marijuana. She reports that she does not drink alcohol.   Family History:  The patient's family history includes Alcohol abuse (age of onset: 71) in her father; Congestive Heart Failure (age of onset: 76) in her mother; Heart disease in her brother; Liver disease in her sister.    ROS:  Please see the history of present illness.   Otherwise, review of systems are positive for none.   All other systems are reviewed and negative.    PHYSICAL EXAM: VS:  BP 112/72 (BP Location: Right Arm, Patient Position: Sitting, Cuff Size: Normal)   Pulse 70   Ht 5\' 4"  (1.626 m)   Wt 157 lb 6.4 oz (71.4 kg)   LMP 08/04/2015 (Approximate)   SpO2 97%   BMI 27.02 kg/m  , BMI Body mass index is 27.02 kg/m. GEN: Well nourished, well developed, in no acute distress  HEENT: normal  Neck: no JVD, carotid bruits, or masses Cardiac: RRR; no murmurs, rubs, or gallops,no edema  Respiratory:  clear to auscultation bilaterally, normal work of breathing GI: soft, nontender, nondistended, + BS MS: no deformity or atrophy  Skin: warm and dry, no rash Neuro:  Strength and sensation are intact Psych: euthymic mood, full affect Vascular: Femoral pulses  are palpable on both sides. No evidence of hematoma. There is a small scar tissue in the left groin but no significant tenderness.  EKG:  EKG is not ordered today.   Recent Labs: 11/27/2015: TSH 2.320 11/29/2015: ALT 11 01/04/2016: BUN 9; Creatinine, Ser 1.06; Hemoglobin 14.4; Platelets 288; Potassium 3.9; Sodium 138    Lipid Panel    Component Value Date/Time   CHOL 218 (H) 11/27/2015 0453   TRIG 331 (H) 11/27/2015 0453   HDL 28 (L) 11/27/2015 0453   CHOLHDL 7.8 11/27/2015 0453   VLDL 66 (H) 11/27/2015 0453   LDLCALC 124 (H) 11/27/2015 0453      Wt Readings from Last 3 Encounters:  01/24/16 157 lb 6.4 oz (71.4 kg)  01/04/16 158 lb (71.7 kg)  12/27/15 156 lb 12.8 oz (71.1 kg)       No flowsheet data  found.    ASSESSMENT AND PLAN:  1.  Peripheral arterial disease:  She had resolution of claudication after recent kissing stent placement to bilateral common iliac arteries. ABI today was normal with patent stents. Repeat studies in 6 months. Continue dual antiplatelet therapy.  2. Tobacco use: I discussed with her the importance of smoking cessation.  3. Coronary artery disease involving native coronary arteries with other forms of angina:  Atypical chest pain at rest and not with activities. Continue to monitor. Given that her blood pressure has been running low and she has been dizzy, I decreased the dose of carvedilol to 6.25 mg twice daily.  4. Hyperlipidemia: Continue treatment with atorvastatin with a target LDL of less than 70.  5. Anxiety and depression: She stopped taking sertraline due to some GI side effects. She feels better overall without medications.   Disposition:   FU with me in 6 month  Signed,  [name], MD  01/24/2016 8:59 AM    [place]

## 2016-01-24 NOTE — Patient Instructions (Signed)
Medication Instructions:  Your physician has recommended you make the following change in your medication:  1. DECREASE Carvedilol to 6.25mg  take one tablet by mouth twice a day  Labwork: No new orders.   Testing/Procedures: Your physician has requested that you have an ankle brachial index (ABI) in 6 MONTHS. During this test an ultrasound and blood pressure cuff are used to evaluate the arteries that supply the arms and legs with blood. Allow thirty minutes for this exam. There are no restrictions or special instructions.  Your physician has requested that you have an aorto-iliac duplex in 6 MONTHS. During this test, an ultrasound is used to evaluate the aorta and iliac arteries. Do not eat after midnight the day before and avoid carbonated beverages  Follow-Up: Your physician wants you to follow-up in: 6 MONTHS with Dr Fletcher Anon.  You will receive a reminder letter in the mail two months in advance. If you don't receive a letter, please call our office to schedule the follow-up appointment.   Any Other Special Instructions Will Be Listed Below (If Applicable).  The patient does NOT have any restrictions from a cardiac standpoint.    If you need a refill on your cardiac medications before your next appointment, please call your pharmacy.

## 2016-01-26 MED FILL — ATORVASTATIN 40 MG TABLET: 40 | 30 days supply | Qty: 30 | Fill #2

## 2016-01-26 MED FILL — CLOPIDOGREL 75 MG TABLET: 75 | 30 days supply | Qty: 30 | Fill #2

## 2016-01-26 MED FILL — ?SERTRALINE HCL 50 MG TABLE: 50 | 30 days supply | Qty: 30 | Fill #1

## 2016-02-24 MED FILL — CLOPIDOGREL 75 MG TABLET: 75 | 30 days supply | Qty: 30 | Fill #3

## 2016-02-24 MED FILL — ATORVASTATIN 40 MG TABLET: 40 | 30 days supply | Qty: 30 | Fill #3

## 2016-02-29 ENCOUNTER — Encounter: Payer: Self-pay | Admitting: *Deleted

## 2016-03-09 ENCOUNTER — Encounter: Payer: Self-pay | Admitting: Cardiology

## 2016-03-09 ENCOUNTER — Ambulatory Visit (INDEPENDENT_AMBULATORY_CARE_PROVIDER_SITE_OTHER): Payer: Self-pay | Admitting: Cardiology

## 2016-03-09 VITALS — BP 108/66 | HR 76 | Ht 64.0 in | Wt 154.6 lb

## 2016-03-09 DIAGNOSIS — I25119 Atherosclerotic heart disease of native coronary artery with unspecified angina pectoris: Secondary | ICD-10-CM

## 2016-03-09 DIAGNOSIS — Z95828 Presence of other vascular implants and grafts: Secondary | ICD-10-CM

## 2016-03-09 DIAGNOSIS — E785 Hyperlipidemia, unspecified: Secondary | ICD-10-CM

## 2016-03-09 DIAGNOSIS — Z720 Tobacco use: Secondary | ICD-10-CM

## 2016-03-09 DIAGNOSIS — I10 Essential (primary) hypertension: Secondary | ICD-10-CM

## 2016-03-09 MED ORDER — CARVEDILOL 3.125 MG PO TABS: 3.1250 mg | ORAL_TABLET | Freq: Two times a day (BID) | ORAL | 3 refills | Status: DC

## 2016-03-09 NOTE — Progress Notes (Signed)
Cardiology Office Note   Date:  03/09/2016   ID:  Robin Walsh, DOB 09-Mar-1966, MRN [id number]  PCP:  Pcp Not In System  Cardiologist:  Dr. Marlou Porch  No chief complaint on file.     History of Present Illness: Robin Walsh is a 50 y.o. female who presents for a follow-up visit.  She has known history of coronary artery disease, hypertension, hyperlipidemia and tobacco use.  She presented with unstable angina 11/2015.  Cardiac catheterization showed 90% mid LAD stenosis which was treated successfully by PCI and drug-eluting stent placement. Dr. Irish Lack.  Ejection fraction was normal.  She was seen for severe right leg claudication. Dr. Fletcher Anon. She underwent noninvasive vascular evaluation which showed mildly reduced ABI on the right side and normal on the left. Duplex showed significant bilateral common iliac artery stenosis worse on the right side.  Angiography showed significant bilateral ostial common iliac artery stenosis worse on the right side with no significant outflow runoff disease. Successful stenting of bilateral common iliac artery without complications.  She had resolution of claudication since then. She complains of occasional substernal chest tightness which is not exertional. She has noted increased dizziness and relatively low blood pressure especially after she takes carvedilol even though it was cut back. She continues to smoke half a pack per day.  Mild episodes of atypical chest pain noted. No claudication.    Past Medical History:  Diagnosis Date  . CAD (coronary artery disease)   . Hyperlipidemia   . Hypertension     Past Surgical History:  Procedure Laterality Date  . CARDIAC CATHETERIZATION N/A 11/28/2015   Procedure: Left Heart Cath and Coronary Angiography;  Surgeon: Jettie Booze, MD;  Location: [place] [facility];  Service: Cardiovascular;  Laterality: N/A;  . CARDIAC CATHETERIZATION N/A 11/28/2015   Procedure: Coronary Stent  Intervention;  Surgeon: Jettie Booze, MD;  Location: [place] [facility];  Service: Cardiovascular;  Laterality: N/A;  DES to Mid LAD; 3.0x12 Synergy  . LEG SURGERY  2002?   tibial plateau fracture  . PERIPHERAL VASCULAR CATHETERIZATION N/A 01/04/2016   Procedure: Abdominal Aortogram;  Surgeon: Nelva Bush, MD;  Location: [place] [facility];  Service: Cardiovascular;  Laterality: N/A;  . PERIPHERAL VASCULAR CATHETERIZATION Bilateral 01/04/2016   Procedure: Lower Extremity Angiography;  Surgeon: Nelva Bush, MD;  Location: [place] [facility];  Service: Cardiovascular;  Laterality: Bilateral;  . PERIPHERAL VASCULAR CATHETERIZATION Bilateral 01/04/2016   Procedure: Peripheral Vascular Intervention;  Surgeon: Nelva Bush, MD;  Location: [place] [facility];  Service: Cardiovascular;  Laterality: Bilateral;  common iliac     Current Outpatient Prescriptions  Medication Sig Dispense Refill  . aspirin 81 MG chewable tablet Chew 1 tablet (81 mg total) by mouth daily. 30 tablet 11  . atorvastatin (LIPITOR) 40 MG tablet Take 1 tablet (40 mg total) by mouth daily at 6 PM. 30 tablet 11  . carvedilol (COREG) 3.125 MG tablet Take 1 tablet (3.125 mg total) by mouth 2 (two) times daily with a meal. 180 tablet 3  . clopidogrel (PLAVIX) 75 MG tablet Take 1 tablet (75 mg total) by mouth daily with breakfast. 30 tablet 11  . lisinopril (PRINIVIL,ZESTRIL) 5 MG tablet Take 5 mg by mouth daily.    . nitroGLYCERIN (NITROSTAT) 0.4 MG SL tablet Place 0.4 mg under the tongue every 5 (five) minutes as needed for chest pain.     No current facility-administered medications for this visit.     Allergies:  Codeine; Dilaudid [hydromorphone hcl]; and Penicillins    Social History:  The patient  reports that she has been smoking Cigarettes.  She has a 25.00 pack-year smoking history. She has never used smokeless tobacco. She reports that she uses drugs, including Marijuana. She reports that she does not  drink alcohol.   Family History:  The patient's family history includes Alcohol abuse (age of onset: 36) in her father; Congestive Heart Failure (age of onset: 23) in her mother; Heart disease in her brother; Liver disease in her sister.    ROS:  Please see the history of present illness.   Otherwise, review of systems are positive for none.   All other systems are reviewed and negative.    PHYSICAL EXAM: VS:  BP 108/66   Pulse 76   Ht 5\' 4"  (1.626 m)   Wt 154 lb 9.6 oz (70.1 kg)   LMP  (LMP Unknown)   BMI 26.54 kg/m  , BMI Body mass index is 26.54 kg/m. GEN: Well nourished, well developed, in no acute distress  HEENT: normal  Neck: no JVD, carotid bruits, or masses Cardiac: RRR; no murmurs, rubs, or gallops,no edema  Respiratory:  clear to auscultation bilaterally, normal work of breathing GI: soft, nontender, nondistended, + BS MS: no deformity or atrophy  Skin: warm and dry, no rash Neuro:  Strength and sensation are intact Psych: euthymic mood, full affect Vascular: Femoral pulses  are palpable on both sides. No evidence of hematoma.   EKG:  EKG is not ordered today.   Recent Labs: 11/27/2015: TSH 2.320 11/29/2015: ALT 11 01/04/2016: BUN 9; Creatinine, Ser 1.06; Hemoglobin 14.4; Platelets 288; Potassium 3.9; Sodium 138    Lipid Panel    Component Value Date/Time   CHOL 218 (H) 11/27/2015 0453   TRIG 331 (H) 11/27/2015 0453   HDL 28 (L) 11/27/2015 0453   CHOLHDL 7.8 11/27/2015 0453   VLDL 66 (H) 11/27/2015 0453   LDLCALC 124 (H) 11/27/2015 0453      Wt Readings from Last 3 Encounters:  03/09/16 154 lb 9.6 oz (70.1 kg)  01/24/16 157 lb 6.4 oz (71.4 kg)  01/04/16 158 lb (71.7 kg)       No flowsheet data found.    ASSESSMENT AND PLAN:  1.  Peripheral arterial disease:  She had resolution of claudication after recent kissing stent placement to bilateral common iliac arteries. ABI today was normal with patent stents.  Continue dual antiplatelet  therapy.  2. Tobacco use: I discussed with her the importance of smoking cessation. Still smoking. Increases risk for future CV events.   3. Coronary artery disease involving native coronary arteries with other forms of angina:  Atypical chest pain at rest and not with activities. Continue to monitor. Given that her blood pressure has been running low and she has been dizzy, I decreased the dose of carvedilol again from 6.25 mg twice daily to 3.125mg  BID.  4. Hyperlipidemia: Continue treatment with atorvastatin with a target LDL of less than 70.  5. Anxiety and depression: She stopped taking sertraline due to some GI side effects. She feels better overall without medications.   Disposition:   FU with APP in 6 month, 12 months with me.   Signed,  Candee Furbish, MD  03/09/2016 10:19 AM    [place]

## 2016-03-09 NOTE — Patient Instructions (Signed)
Medication Instructions:  Please decrease your Carvedilol to 3.125 mg twice a day. Continue all other medications as listed.  Follow-Up: Follow up in 6 months with Bonney Leitz, PA.  You will receive a letter in the mail 2 months before you are due.  Please call us when you receive this letter to schedule your follow up appointment.  Follow up in 1 year with Dr. Marlou Porch.  You will receive a letter in the mail 2 months before you are due.  Please call us when you receive this letter to schedule your follow up appointment.  If you need a refill on your cardiac medications before your next appointment, please call your pharmacy.  Thank you for choosing [facility]!!

## 2016-03-28 MED FILL — NITROSTAT 0.4 MG TABLET SL: 0.4 | 25 days supply | Qty: 25 | Fill #1

## 2016-03-28 MED FILL — CLOPIDOGREL 75 MG TABLET: 75 | 30 days supply | Qty: 30 | Fill #4

## 2016-03-28 MED FILL — ATORVASTATIN 40 MG TABLET: 40 | 30 days supply | Qty: 30 | Fill #4

## 2016-05-01 MED FILL — CLOPIDOGREL 75 MG TABLET: 75 | 30 days supply | Qty: 30 | Fill #5

## 2016-05-01 MED FILL — ATORVASTATIN 40 MG TABLET: 40 | 30 days supply | Qty: 30 | Fill #5

## 2016-05-31 MED FILL — ATORVASTATIN 40 MG TABLET: 40 | 30 days supply | Qty: 30 | Fill #6

## 2016-05-31 MED FILL — CLOPIDOGREL 75 MG TABLET: 75 | 30 days supply | Qty: 30 | Fill #6

## 2016-07-02 ENCOUNTER — Other Ambulatory Visit: Payer: Self-pay | Admitting: *Deleted

## 2016-07-02 MED ORDER — CARVEDILOL 3.125 MG PO TABS: 3.1250 mg | ORAL_TABLET | Freq: Two times a day (BID) | ORAL | 8 refills | Status: DC

## 2016-07-02 MED FILL — CLOPIDOGREL 75 MG TABLET: 75 | 30 days supply | Qty: 30 | Fill #7

## 2016-07-02 MED FILL — ATORVASTATIN 40 MG TABLET: 40 | 30 days supply | Qty: 30 | Fill #7

## 2016-07-02 MED FILL — NITROSTAT 0.4 MG TABLET SL: 0.4 | 25 days supply | Qty: 25 | Fill #2

## 2016-07-10 ENCOUNTER — Ambulatory Visit: Payer: Self-pay | Attending: Internal Medicine

## 2016-07-30 ENCOUNTER — Ambulatory Visit (HOSPITAL_COMMUNITY)
Admission: EM | Admit: 2016-07-30 | Discharge: 2016-07-30 | Disposition: A | Discharge status: Home / Self Care | Payer: Self-pay | Attending: Family Medicine | Admitting: Family Medicine

## 2016-07-30 ENCOUNTER — Encounter (HOSPITAL_COMMUNITY): Payer: Self-pay | Admitting: Emergency Medicine

## 2016-07-30 DIAGNOSIS — J40 Bronchitis, not specified as acute or chronic: Secondary | ICD-10-CM

## 2016-07-30 DIAGNOSIS — R05 Cough: Secondary | ICD-10-CM

## 2016-07-30 MED ORDER — AMOXICILLIN 500 MG PO CAPS: 500.0000 mg | ORAL_CAPSULE | Freq: Three times a day (TID) | ORAL | 0 refills | Status: DC

## 2016-07-30 MED ORDER — ALBUTEROL SULFATE (2.5 MG/3ML) 0.083% IN NEBU
INHALATION_SOLUTION | RESPIRATORY_TRACT | Status: AC
  Filled 2016-07-30: qty 3

## 2016-07-30 MED ORDER — HYDROCODONE-HOMATROPINE 5-1.5 MG/5ML PO SYRP: 5.0000 mL | ORAL_SOLUTION | Freq: Four times a day (QID) | ORAL | 0 refills | Status: DC | PRN

## 2016-07-30 MED ORDER — ALBUTEROL SULFATE (2.5 MG/3ML) 0.083% IN NEBU
2.5000 mg | INHALATION_SOLUTION | Freq: Once | RESPIRATORY_TRACT | Status: AC
  Administered 2016-07-30: 2.5 mg via RESPIRATORY_TRACT

## 2016-07-30 NOTE — ED Provider Notes (Signed)
[place]    CSN: [id number] Arrival date & time: 07/30/16  1806     History   Chief Complaint Chief Complaint  Patient presents with  . Generalized Body Aches  . Cough    HPI Robin Walsh is a 51 y.o. female.   The patient presented to the [facility] with a complaint of a cough and general body aches x 3 days. Patient has an appointment tomorrow at the [place]. She's been coughing so hard that she has not been able to sleep.  Patient has a history of COPD. She continues to smoke. She has coughed so hard that she vomits.  Patient works for an [employer] firm  Note: Patient has taken Tussionex and amoxicillin in the past without difficulty.      Past Medical History:  Diagnosis Date  . CAD (coronary artery disease)   . Hyperlipidemia   . Hypertension     Patient Active Problem List   Diagnosis Date Noted  . PAD (peripheral artery disease) ([place]) 01/04/2016  . Medication management 12/09/2015  . Chest pain   . Abnormal finding on EKG - anterior T-wave inversions concerning for LAD ischemia 11/28/2015  . CAD S/P DES PCI TO mLAD - Synergy DES 3.0 mm x 12 mm 11/28/2015  . Unstable angina ([place]) 11/27/2015  . Essential hypertension 11/27/2015  . Dyslipidemia, goal LDL below 70 11/27/2015  . Tobacco dependence 11/27/2015  . Noncompliance 11/27/2015  . Marijuana dependence ([place]) 11/27/2015  . Right hip pain 11/27/2015    Past Surgical History:  Procedure Laterality Date  . CARDIAC CATHETERIZATION N/A 11/28/2015   Procedure: Left Heart Cath and Coronary Angiography;  Surgeon: Jettie Booze, MD;  Location: [place] [facility];  Service: Cardiovascular;  Laterality: N/A;  . CARDIAC CATHETERIZATION N/A 11/28/2015   Procedure: Coronary Stent Intervention;  Surgeon: Jettie Booze, MD;  Location: [place] [facility];  Service: Cardiovascular;  Laterality: N/A;  DES to Mid LAD; 3.0x12 Synergy  . LEG SURGERY  2002?   tibial plateau  fracture  . PERIPHERAL VASCULAR CATHETERIZATION N/A 01/04/2016   Procedure: Abdominal Aortogram;  Surgeon: Nelva Bush, MD;  Location: [place] [facility];  Service: Cardiovascular;  Laterality: N/A;  . PERIPHERAL VASCULAR CATHETERIZATION Bilateral 01/04/2016   Procedure: Lower Extremity Angiography;  Surgeon: Nelva Bush, MD;  Location: [place] [facility];  Service: Cardiovascular;  Laterality: Bilateral;  . PERIPHERAL VASCULAR CATHETERIZATION Bilateral 01/04/2016   Procedure: Peripheral Vascular Intervention;  Surgeon: Nelva Bush, MD;  Location: [place] [facility];  Service: Cardiovascular;  Laterality: Bilateral;  common iliac    OB History    No data available       Home Medications    Prior to Admission medications   Medication Sig Start Date End Date Taking? Authorizing Provider  amoxicillin (AMOXIL) 500 MG capsule Take 1 capsule (500 mg total) by mouth 3 (three) times daily. 07/30/16   Robyn Haber, MD  aspirin 81 MG chewable tablet Chew 1 tablet (81 mg total) by mouth daily. 11/29/15   Reyne Dumas, MD  atorvastatin (LIPITOR) 40 MG tablet Take 1 tablet (40 mg total) by mouth daily at 6 PM. 11/29/15   Reyne Dumas, MD  carvedilol (COREG) 3.125 MG tablet Take 1 tablet (3.125 mg total) by mouth 2 (two) times daily with a meal. 07/02/16   Jerline Pain, MD  clopidogrel (PLAVIX) 75 MG tablet Take 1 tablet (75 mg total) by mouth daily with breakfast. 11/29/15   Ascencion Dike  Abrol, MD  HYDROcodone-homatropine (HYDROMET) 5-1.5 MG/5ML syrup Take 5 mLs by mouth every 6 (six) hours as needed for cough. 07/30/16   Robyn Haber, MD  lisinopril (PRINIVIL,ZESTRIL) 5 MG tablet Take 5 mg by mouth daily.    Historical Provider, MD  nitroGLYCERIN (NITROSTAT) 0.4 MG SL tablet Place 0.4 mg under the tongue every 5 (five) minutes as needed for chest pain.    Historical Provider, MD    Family History Family History  Problem Relation Age of Onset  . Congestive Heart Failure Mother 37  . Alcohol  abuse Father 46  . Heart disease Brother   . Liver disease Sister     kidney/liver    Social History Social History  Substance Use Topics  . Smoking status: Current Every Day Smoker    Packs/day: 1.00    Years: 25.00    Types: Cigarettes  . Smokeless tobacco: Never Used  . Alcohol use No     Allergies   Codeine; Dilaudid [hydromorphone hcl]; and Penicillins   Review of Systems Review of Systems  Constitutional: Positive for fatigue and fever.  HENT: Positive for congestion.   Respiratory: Positive for cough.   Gastrointestinal: Positive for vomiting.  Musculoskeletal: Positive for myalgias.  Neurological: Negative.   All other systems reviewed and are negative.    Physical Exam Triage Vital Signs ED Triage Vitals  Enc Vitals Group     BP 07/30/16 1835 126/82     Pulse Rate 07/30/16 1835 61     Resp 07/30/16 1835 20     Temp 07/30/16 1835 98.6 F (37 C)     Temp Source 07/30/16 1835 Oral     SpO2 07/30/16 1835 98 %     Weight --      Height --      Head Circumference --      Peak Flow --      Pain Score 07/30/16 1834 3     Pain Loc --      Pain Edu? --      Excl. in Industry? --    No data found.   Updated Vital Signs BP 126/82 (BP Location: Right Arm)   Pulse 61   Temp 98.6 F (37 C) (Oral)   Resp 20   SpO2 98%    Physical Exam  Constitutional: She is oriented to person, place, and time. She appears well-developed and well-nourished.  HENT:  Right Ear: External ear normal.  Left Ear: External ear normal.  Mouth/Throat: Oropharynx is clear and moist.  Eyes: Conjunctivae and EOM are normal. Pupils are equal, round, and reactive to light.  Neck: Normal range of motion. Neck supple.  Cardiovascular: Normal rate, regular rhythm and normal heart sounds.   Pulmonary/Chest: Effort normal. She has wheezes.  Musculoskeletal: Normal range of motion.  Neurological: She is alert and oriented to person, place, and time.  Skin: Skin is warm and dry.  Nursing  note and vitals reviewed.    UC Treatments / Results  Labs (all labs ordered are listed, but only abnormal results are displayed) Labs Reviewed - No data to display  EKG  EKG Interpretation None       Radiology No results found.  Procedures Procedures (including critical care time)  Medications Ordered in UC Medications  albuterol (PROVENTIL) (2.5 MG/3ML) 0.083% nebulizer solution 2.5 mg (not administered)     Initial Impression / Assessment and Plan / UC Course  I have reviewed the triage vital signs and the nursing notes.  Pertinent labs & imaging results that were available during my care of the patient were reviewed by me and considered in my medical decision making (see chart for details).     Final Clinical Impressions(s) / UC Diagnoses   Final diagnoses:  Bronchitis    New Prescriptions New Prescriptions   AMOXICILLIN (AMOXIL) 500 MG CAPSULE    Take 1 capsule (500 mg total) by mouth 3 (three) times daily.   HYDROCODONE-HOMATROPINE (HYDROMET) 5-1.5 MG/5ML SYRUP    Take 5 mLs by mouth every 6 (six) hours as needed for cough.     Robyn Haber, MD 07/30/16 Lurena Nida

## 2016-07-30 NOTE — Discharge Instructions (Signed)
I believe it would be safe to take ibuprofen for several days to alleviate your aches.

## 2016-07-30 NOTE — ED Triage Notes (Signed)
The patient presented to the [facility] with a complaint of a cough and general body aches x 3 days.

## 2016-07-31 ENCOUNTER — Ambulatory Visit: Payer: Self-pay | Attending: Family Medicine | Admitting: Family Medicine

## 2016-07-31 ENCOUNTER — Encounter: Payer: Self-pay | Admitting: Family Medicine

## 2016-07-31 VITALS — BP 128/80 | HR 103 | Temp 98.4°F | Ht 65.0 in | Wt 157.6 lb

## 2016-07-31 DIAGNOSIS — R110 Nausea: Secondary | ICD-10-CM

## 2016-07-31 DIAGNOSIS — J40 Bronchitis, not specified as acute or chronic: Secondary | ICD-10-CM | POA: Insufficient documentation

## 2016-07-31 DIAGNOSIS — I2510 Atherosclerotic heart disease of native coronary artery without angina pectoris: Secondary | ICD-10-CM | POA: Insufficient documentation

## 2016-07-31 DIAGNOSIS — E785 Hyperlipidemia, unspecified: Secondary | ICD-10-CM | POA: Insufficient documentation

## 2016-07-31 DIAGNOSIS — Z87891 Personal history of nicotine dependence: Secondary | ICD-10-CM | POA: Insufficient documentation

## 2016-07-31 DIAGNOSIS — I10 Essential (primary) hypertension: Secondary | ICD-10-CM | POA: Insufficient documentation

## 2016-07-31 DIAGNOSIS — K5900 Constipation, unspecified: Secondary | ICD-10-CM | POA: Insufficient documentation

## 2016-07-31 DIAGNOSIS — Z7982 Long term (current) use of aspirin: Secondary | ICD-10-CM | POA: Insufficient documentation

## 2016-07-31 DIAGNOSIS — Z7902 Long term (current) use of antithrombotics/antiplatelets: Secondary | ICD-10-CM | POA: Insufficient documentation

## 2016-07-31 DIAGNOSIS — Z885 Allergy status to narcotic agent status: Secondary | ICD-10-CM | POA: Insufficient documentation

## 2016-07-31 DIAGNOSIS — Z79899 Other long term (current) drug therapy: Secondary | ICD-10-CM | POA: Insufficient documentation

## 2016-07-31 DIAGNOSIS — I739 Peripheral vascular disease, unspecified: Secondary | ICD-10-CM | POA: Insufficient documentation

## 2016-07-31 DIAGNOSIS — Z880 Allergy status to penicillin: Secondary | ICD-10-CM | POA: Insufficient documentation

## 2016-07-31 DIAGNOSIS — K5909 Other constipation: Secondary | ICD-10-CM

## 2016-07-31 DIAGNOSIS — R21 Rash and other nonspecific skin eruption: Secondary | ICD-10-CM | POA: Insufficient documentation

## 2016-07-31 DIAGNOSIS — F17200 Nicotine dependence, unspecified, uncomplicated: Secondary | ICD-10-CM

## 2016-07-31 DIAGNOSIS — Z9861 Coronary angioplasty status: Secondary | ICD-10-CM

## 2016-07-31 MED ORDER — ALBUTEROL SULFATE HFA 108 (90 BASE) MCG/ACT IN AERS: 2.0000 | INHALATION_SPRAY | Freq: Four times a day (QID) | RESPIRATORY_TRACT | 0 refills | Status: DC | PRN

## 2016-07-31 MED ORDER — HYDROCORTISONE 0.5 % EX CREA: 1.0000 "application " | TOPICAL_CREAM | Freq: Two times a day (BID) | CUTANEOUS | 0 refills | Status: DC

## 2016-07-31 MED ORDER — PROMETHAZINE HCL 25 MG PO TABS: 25.0000 mg | ORAL_TABLET | Freq: Three times a day (TID) | ORAL | 0 refills | Status: DC | PRN

## 2016-07-31 MED ORDER — DEXTROMETHORPHAN-GUAIFENESIN 10-100 MG/5ML PO LIQD: 10.0000 mL | Freq: Four times a day (QID) | ORAL | 0 refills | Status: DC | PRN

## 2016-07-31 NOTE — Progress Notes (Signed)
Only taking coreg once daily  In Urgent Care last night

## 2016-07-31 NOTE — Progress Notes (Signed)
Subjective:  Patient ID: Robin Walsh, female    DOB: [birth year]  Age: 51 y.o. MRN: [id number]  CC: Rash (face); Cough (productive-"can taste infection"); vomited (yesterday and sunday); Diarrhea (yesterday); Bloated (when she eats); and Rectal Bleeding   HPI Robin Walsh is a 51 year old female with a history of tobacco abuse, coronary artery disease (status post PCI and drug-eluting stent placement to LAD) peripheral arterial disease (status post stenting of bilateral common iliac artery) who presents today for follow-up visit.  She has a chronic rash on her face which has been present for the last 1 year. Rash is initially pruritic when it appears and has been intermittent. She denies rash in other parts of her body.  She endorses constipation and has to strain to move her bowels with resultant rectal bleeds; informs me of a history of rectal fissures.  She was seen at urgent care yesterday for bronchitis where she was prescribed azithromycin and and antitussives but complains of nausea with antitussives. Complains of cough and feels the mucosa in her throat, hoarseness of her voice. She is requesting an inhaler as well as she does have some shortness of breath. Denies fever.  Past Medical History:  Diagnosis Date  . CAD (coronary artery disease)   . Hyperlipidemia   . Hypertension     Past Surgical History:  Procedure Laterality Date  . CARDIAC CATHETERIZATION N/A 11/28/2015   Procedure: Left Heart Cath and Coronary Angiography;  Surgeon: Jettie Booze, MD;  Location: [place] [facility];  Service: Cardiovascular;  Laterality: N/A;  . CARDIAC CATHETERIZATION N/A 11/28/2015   Procedure: Coronary Stent Intervention;  Surgeon: Jettie Booze, MD;  Location: [place] [facility];  Service: Cardiovascular;  Laterality: N/A;  DES to Mid LAD; 3.0x12 Synergy  . LEG SURGERY  2002?   tibial plateau fracture  . PERIPHERAL VASCULAR CATHETERIZATION N/A 01/04/2016   Procedure:  Abdominal Aortogram;  Surgeon: Nelva Bush, MD;  Location: [place] [facility];  Service: Cardiovascular;  Laterality: N/A;  . PERIPHERAL VASCULAR CATHETERIZATION Bilateral 01/04/2016   Procedure: Lower Extremity Angiography;  Surgeon: Nelva Bush, MD;  Location: [place] [facility];  Service: Cardiovascular;  Laterality: Bilateral;  . PERIPHERAL VASCULAR CATHETERIZATION Bilateral 01/04/2016   Procedure: Peripheral Vascular Intervention;  Surgeon: Nelva Bush, MD;  Location: [place] [facility];  Service: Cardiovascular;  Laterality: Bilateral;  common iliac    Allergies  Allergen Reactions  . Codeine Nausea And Vomiting  . Dilaudid [Hydromorphone Hcl] Nausea And Vomiting  . Penicillins Rash    Was told when she was a child that she was allergic.  Can take Amoxicillin.      Outpatient Medications Prior to Visit  Medication Sig Dispense Refill  . amoxicillin (AMOXIL) 500 MG capsule Take 1 capsule (500 mg total) by mouth 3 (three) times daily. 21 capsule 0  . aspirin 81 MG chewable tablet Chew 1 tablet (81 mg total) by mouth daily. 30 tablet 11  . atorvastatin (LIPITOR) 40 MG tablet Take 1 tablet (40 mg total) by mouth daily at 6 PM. 30 tablet 11  . carvedilol (COREG) 3.125 MG tablet Take 1 tablet (3.125 mg total) by mouth 2 (two) times daily with a meal. 60 tablet 8  . clopidogrel (PLAVIX) 75 MG tablet Take 1 tablet (75 mg total) by mouth daily with breakfast. 30 tablet 11  . lisinopril (PRINIVIL,ZESTRIL) 5 MG tablet Take 5 mg by mouth daily.    Marland Kitchen HYDROcodone-homatropine (HYDROMET) 5-1.5 MG/5ML syrup Take 5  mLs by mouth every 6 (six) hours as needed for cough. (Patient not taking: Reported on 07/31/2016) 120 mL 0  . nitroGLYCERIN (NITROSTAT) 0.4 MG SL tablet Place 0.4 mg under the tongue every 5 (five) minutes as needed for chest pain.     No facility-administered medications prior to visit.     ROS Review of Systems  Constitutional: Negative for activity change, appetite change  and fatigue.  HENT: Negative for congestion, sinus pressure and sore throat.   Eyes: Negative for visual disturbance.  Respiratory: Positive for cough and shortness of breath. Negative for chest tightness and wheezing.   Cardiovascular: Negative for chest pain and palpitations.  Gastrointestinal: Positive for blood in stool. Negative for abdominal distention, abdominal pain and constipation.  Endocrine: Negative for polydipsia.  Genitourinary: Negative for dysuria and frequency.  Musculoskeletal: Negative for arthralgias and back pain.  Skin: Positive for rash.  Neurological: Negative for tremors, light-headedness and numbness.  Hematological: Does not bruise/bleed easily.  Psychiatric/Behavioral: Negative for agitation and behavioral problems.    Objective:  BP 128/80 (BP Location: Right Arm, Patient Position: Sitting, Cuff Size: Small)   Pulse (!) 103   Temp 98.4 F (36.9 C) (Oral)   Ht 5\' 5"  (1.651 m)   Wt 157 lb 9.6 oz (71.5 kg)   SpO2 97%   BMI 26.23 kg/m   BP/Weight 07/31/2016 07/30/2016 89/05/1192  Systolic BP 174 081 448  Diastolic BP 80 82 66  Wt. (Lbs) 157.6 - 154.6  BMI 26.23 - 26.54      Physical Exam  Constitutional: She is oriented to person, place, and time. She appears well-developed and well-nourished.  Cardiovascular: Normal rate, normal heart sounds and intact distal pulses.   No murmur heard. Pulmonary/Chest: Effort normal and breath sounds normal. She has no wheezes. She has no rales. She exhibits no tenderness.  Abdominal: Soft. Bowel sounds are normal. She exhibits no distension and no mass. There is no tenderness.  Musculoskeletal: Normal range of motion.  Neurological: She is alert and oriented to person, place, and time.  Skin: Skin is warm and dry.  Psychiatric: She has a normal mood and affect.     Assessment & Plan:   1. Tobacco dependence Spent 3 minutes counseling on cessation and she is not ready to quit  2. PAD (peripheral artery  disease) (HCC) Status post stent of bilateral common iliac artery Risk factor modification  3. Rash Eczematous in nature Avoid scented products - hydrocortisone cream 0.5 %; Apply 1 application topically 2 (two) times daily.  Dispense: 30 g; Refill: 0  4. CAD S/P DES PCI TO mLAD - Synergy DES 3.0 mm x 12 mm Continue Plavix, aspirin Risk factor modification  5. Bronchitis - albuterol (PROVENTIL HFA;VENTOLIN HFA) 108 (90 Base) MCG/ACT inhaler; Inhale 2 puffs into the lungs every 6 (six) hours as needed for wheezing or shortness of breath.  Dispense: 1 Inhaler; Refill: 0 - dextromethorphan-guaiFENesin (TUSSIN DM) 10-100 MG/5ML liquid; Take 10 mLs by mouth every 6 (six) hours as needed for cough.  Dispense: 180 mL; Refill: 0  6. Nausea - promethazine (PHENERGAN) 25 MG tablet; Take 1 tablet (25 mg total) by mouth every 8 (eight) hours as needed for nausea or vomiting.  Dispense: 20 tablet; Refill: 0  7. Constipation With resultant rectal fissuring and bleeding Dietary modifications to decrease constipation Use OTC MiraLAX  Meds ordered this encounter  Medications  . hydrocortisone cream 0.5 %    Sig: Apply 1 application topically 2 (two) times daily.  Dispense:  30 g    Refill:  0  . albuterol (PROVENTIL HFA;VENTOLIN HFA) 108 (90 Base) MCG/ACT inhaler    Sig: Inhale 2 puffs into the lungs every 6 (six) hours as needed for wheezing or shortness of breath.    Dispense:  1 Inhaler    Refill:  0  . dextromethorphan-guaiFENesin (TUSSIN DM) 10-100 MG/5ML liquid    Sig: Take 10 mLs by mouth every 6 (six) hours as needed for cough.    Dispense:  180 mL    Refill:  0  . promethazine (PHENERGAN) 25 MG tablet    Sig: Take 1 tablet (25 mg total) by mouth every 8 (eight) hours as needed for nausea or vomiting.    Dispense:  20 tablet    Refill:  0    Follow-up: Return in about 1 month (around 08/30/2016) for Follow-up on chronic medical conditions.   Arnoldo Morale MD

## 2016-08-07 MED FILL — CLOPIDOGREL 75 MG TABLET: 75 | 30 days supply | Qty: 30 | Fill #8

## 2016-08-07 MED FILL — ATORVASTATIN 40 MG TABLET: 40 | 30 days supply | Qty: 30 | Fill #8

## 2016-08-08 MED FILL — !VENTOLIN HFA INHALER: 108 (90 BAS | 25 days supply | Qty: 1 | Fill #0

## 2016-08-28 ENCOUNTER — Ambulatory Visit: Payer: Self-pay | Attending: Family Medicine | Admitting: Family Medicine

## 2016-08-28 ENCOUNTER — Encounter: Payer: Self-pay | Admitting: Family Medicine

## 2016-08-28 VITALS — BP 125/82 | HR 87 | Temp 98.2°F | Resp 18 | Ht 65.0 in | Wt 159.0 lb

## 2016-08-28 DIAGNOSIS — Z7902 Long term (current) use of antithrombotics/antiplatelets: Secondary | ICD-10-CM | POA: Insufficient documentation

## 2016-08-28 DIAGNOSIS — N951 Menopausal and female climacteric states: Secondary | ICD-10-CM | POA: Insufficient documentation

## 2016-08-28 DIAGNOSIS — B351 Tinea unguium: Secondary | ICD-10-CM | POA: Insufficient documentation

## 2016-08-28 DIAGNOSIS — F17200 Nicotine dependence, unspecified, uncomplicated: Secondary | ICD-10-CM | POA: Insufficient documentation

## 2016-08-28 DIAGNOSIS — Z955 Presence of coronary angioplasty implant and graft: Secondary | ICD-10-CM | POA: Insufficient documentation

## 2016-08-28 DIAGNOSIS — I2510 Atherosclerotic heart disease of native coronary artery without angina pectoris: Secondary | ICD-10-CM | POA: Insufficient documentation

## 2016-08-28 DIAGNOSIS — I10 Essential (primary) hypertension: Secondary | ICD-10-CM | POA: Insufficient documentation

## 2016-08-28 DIAGNOSIS — R21 Rash and other nonspecific skin eruption: Secondary | ICD-10-CM | POA: Insufficient documentation

## 2016-08-28 DIAGNOSIS — R232 Flushing: Secondary | ICD-10-CM

## 2016-08-28 DIAGNOSIS — Z9861 Coronary angioplasty status: Secondary | ICD-10-CM

## 2016-08-28 DIAGNOSIS — E785 Hyperlipidemia, unspecified: Secondary | ICD-10-CM | POA: Insufficient documentation

## 2016-08-28 DIAGNOSIS — I739 Peripheral vascular disease, unspecified: Secondary | ICD-10-CM | POA: Insufficient documentation

## 2016-08-28 DIAGNOSIS — Z7982 Long term (current) use of aspirin: Secondary | ICD-10-CM | POA: Insufficient documentation

## 2016-08-28 MED ORDER — CLONIDINE HCL 0.1 MG PO TABS: 0.1000 mg | ORAL_TABLET | Freq: Every evening | ORAL | 3 refills | Status: DC | PRN

## 2016-08-28 MED ORDER — HYDROCORTISONE 0.5 % EX CREA: 1.0000 "application " | TOPICAL_CREAM | Freq: Two times a day (BID) | CUTANEOUS | 1 refills | Status: DC

## 2016-08-28 MED ORDER — TERBINAFINE HCL 1 % EX CREA: 1.0000 "application " | TOPICAL_CREAM | Freq: Two times a day (BID) | CUTANEOUS | 0 refills | Status: DC

## 2016-08-28 MED FILL — ?CLONIDINE HCL 0.1 MG TABL: 0.1 | 30 days supply | Qty: 30 | Fill #0

## 2016-08-28 NOTE — Patient Instructions (Signed)

## 2016-08-28 NOTE — Progress Notes (Signed)
Subjective:  Patient ID: Robin Walsh, female    DOB: 12/16/1965  Age: 51 y.o. MRN: [id number]  CC: Establish Care   HPI Robin Walsh is a 51 year old female with a history of tobacco abuse, coronary artery disease (status post PCI and drug-eluting stent placement to LAD) peripheral arterial disease (status post stenting of bilateral common iliac artery) who presents today for follow-up visit. She was treated for bronchitis at her last visit and reports resolution of symptoms.  She complains of hot flashes which are intermittent and would like to have something for them.  Concerned about a fungal infection in her left big fingernail. She has had them in the fingernails before but they have resolved spontaneously. Denies prolonged exposure of hands to water.  She informs me she is unsure if she has an allergy to penicillin and she has been able to tolerate penicillins in the past and would like this to be taken of her med list.  Past Medical History:  Diagnosis Date  . CAD (coronary artery disease)   . Hyperlipidemia   . Hypertension     Past Surgical History:  Procedure Laterality Date  . CARDIAC CATHETERIZATION N/A 11/28/2015   Procedure: Left Heart Cath and Coronary Angiography;  Surgeon: Jettie Booze, MD;  Location: [facility];  Service: Cardiovascular;  Laterality: N/A;  . CARDIAC CATHETERIZATION N/A 11/28/2015   Procedure: Coronary Stent Intervention;  Surgeon: Jettie Booze, MD;  Location: [facility];  Service: Cardiovascular;  Laterality: N/A;  DES to Mid LAD; 3.0x12 Synergy  . LEG SURGERY  2002?   tibial plateau fracture  . PERIPHERAL VASCULAR CATHETERIZATION N/A 01/04/2016   Procedure: Abdominal Aortogram;  Surgeon: Nelva Bush, MD;  Location: [facility];  Service: Cardiovascular;  Laterality: N/A;  . PERIPHERAL VASCULAR CATHETERIZATION Bilateral 01/04/2016   Procedure: Lower Extremity Angiography;  Surgeon: Nelva Bush, MD;   Location: [facility];  Service: Cardiovascular;  Laterality: Bilateral;  . PERIPHERAL VASCULAR CATHETERIZATION Bilateral 01/04/2016   Procedure: Peripheral Vascular Intervention;  Surgeon: Nelva Bush, MD;  Location: [facility];  Service: Cardiovascular;  Laterality: Bilateral;  common iliac    Allergies  Allergen Reactions  . Codeine Nausea And Vomiting  . Dilaudid [Hydromorphone Hcl] Nausea And Vomiting       Outpatient Medications Prior to Visit  Medication Sig Dispense Refill  . albuterol (PROVENTIL HFA;VENTOLIN HFA) 108 (90 Base) MCG/ACT inhaler Inhale 2 puffs into the lungs every 6 (six) hours as needed for wheezing or shortness of breath. 1 Inhaler 0  . aspirin 81 MG chewable tablet Chew 1 tablet (81 mg total) by mouth daily. 30 tablet 11  . atorvastatin (LIPITOR) 40 MG tablet Take 1 tablet (40 mg total) by mouth daily at 6 PM. 30 tablet 11  . carvedilol (COREG) 3.125 MG tablet Take 1 tablet (3.125 mg total) by mouth 2 (two) times daily with a meal. 60 tablet 8  . clopidogrel (PLAVIX) 75 MG tablet Take 1 tablet (75 mg total) by mouth daily with breakfast. 30 tablet 11  . lisinopril (PRINIVIL,ZESTRIL) 5 MG tablet Take 5 mg by mouth daily.    . nitroGLYCERIN (NITROSTAT) 0.4 MG SL tablet Place 0.4 mg under the tongue every 5 (five) minutes as needed for chest pain.    . promethazine (PHENERGAN) 25 MG tablet Take 1 tablet (25 mg total) by mouth every 8 (eight) hours as needed for nausea or vomiting. 20 tablet 0  . amoxicillin (  AMOXIL) 500 MG capsule Take 1 capsule (500 mg total) by mouth 3 (three) times daily. (Patient not taking: Reported on 08/28/2016) 21 capsule 0  . dextromethorphan-guaiFENesin (TUSSIN DM) 10-100 MG/5ML liquid Take 10 mLs by mouth every 6 (six) hours as needed for cough. (Patient not taking: Reported on 08/28/2016) 180 mL 0  . HYDROcodone-homatropine (HYDROMET) 5-1.5 MG/5ML syrup Take 5 mLs by mouth every 6 (six) hours as needed for cough. (Patient  not taking: Reported on 07/31/2016) 120 mL 0  . hydrocortisone cream 0.5 % Apply 1 application topically 2 (two) times daily. (Patient not taking: Reported on 08/28/2016) 30 g 0   No facility-administered medications prior to visit.     ROS Review of Systems  Constitutional: Negative for activity change, appetite change and fatigue.  HENT: Negative for congestion, sinus pressure and sore throat.   Eyes: Negative for visual disturbance.  Respiratory: Negative for cough, chest tightness, shortness of breath and wheezing.   Cardiovascular: Negative for chest pain and palpitations.  Gastrointestinal: Negative for abdominal distention, abdominal pain and constipation.  Endocrine: Negative for polydipsia.  Genitourinary: Negative for dysuria and frequency.  Musculoskeletal: Negative for arthralgias and back pain.  Skin: Positive for rash.  Neurological: Negative for tremors, light-headedness and numbness.  Hematological: Does not bruise/bleed easily.  Psychiatric/Behavioral: Negative for agitation and behavioral problems.    Objective:  BP 125/82 (BP Location: Left Arm, Patient Position: Sitting, Cuff Size: Normal)   Pulse 87   Temp 98.2 F (36.8 C) (Oral)   Resp 18   Ht _0  (1.651 m)   Wt 159 lb (72.1 kg)   SpO2 98%   BMI 26.46 kg/m   BP/Weight 08/28/2016 07/31/2016 0/76/2263  Systolic BP 335 456 256  Diastolic BP 82 80 82  Wt. (Lbs) 159 157.6 -  BMI 26.46 26.23 -      Physical Exam  Constitutional: She is oriented to person, place, and time. She appears well-developed and well-nourished.  Cardiovascular: Normal rate, normal heart sounds and intact distal pulses.   No murmur heard. Pulmonary/Chest: Effort normal and breath sounds normal. She has no wheezes. She has no rales. She exhibits no tenderness.  Abdominal: Soft. Bowel sounds are normal. She exhibits no distension and no mass. There is no tenderness.  Musculoskeletal: Normal range of motion.  Neurological: She is  alert and oriented to person, place, and time.  Skin:  Erythematous rash anterior chest wall Fungal infection in the nail bed of L big thumb  Psychiatric: She has a normal mood and affect.     Assessment & Plan:   1. Rash - hydrocortisone cream 0.5 %; Apply 1 application topically 2 (two) times daily.  Dispense: 30 g; Refill: 1  2. Hot flashes - cloNIDine (CATAPRES) 0.1 MG tablet; Take 1 tablet (0.1 mg total) by mouth at bedtime as needed. For hot flashes  Dispense: 90 tablet; Refill: 3  3. Tobacco dependence Spent 20 minutes counseling on cessation and she is not ready to quit  4. Essential hypertension Controlled - CMP14+EGFR; Future - Lipid panel; Future  5. CAD S/P DES PCI TO mLAD - Synergy DES 3.0 mm x 12 mm Risk factor modification Continue Plavix  6. Tinea unguium We'll treat with topical antifungal and switched to oral if symptoms persist at next visit. - terbinafine (LAMISIL AT) 1 % cream; Apply 1 application topically 2 (two) times daily.  Dispense: 30 g; Refill: 0   Meds ordered this encounter  Medications  . terbinafine (LAMISIL AT) 1 %  cream    Sig: Apply 1 application topically 2 (two) times daily.    Dispense:  30 g    Refill:  0  . hydrocortisone cream 0.5 %    Sig: Apply 1 application topically 2 (two) times daily.    Dispense:  30 g    Refill:  1  . cloNIDine (CATAPRES) 0.1 MG tablet    Sig: Take 1 tablet (0.1 mg total) by mouth at bedtime as needed. For hot flashes    Dispense:  90 tablet    Refill:  3    Follow-up: Return in about 3 months (around 11/28/2016) for Follow-up of nail fungus.   Arnoldo Morale MD

## 2016-08-28 NOTE — Progress Notes (Signed)
Patient is here for f/up  Patient complains about fungus on her nail

## 2016-09-06 MED FILL — CLOPIDOGREL 75 MG TABLET: 75 | 30 days supply | Qty: 30 | Fill #9

## 2016-09-06 MED FILL — ?ATORVASTATIN 40 MG TABLET: 40 MG | 30 days supply | Qty: 30 | Fill #9

## 2016-09-07 ENCOUNTER — Ambulatory Visit: Payer: Self-pay | Attending: Family Medicine

## 2016-09-07 DIAGNOSIS — I10 Essential (primary) hypertension: Secondary | ICD-10-CM | POA: Insufficient documentation

## 2016-09-07 NOTE — Progress Notes (Signed)
Patient here for lab visit only 

## 2016-09-08 LAB — CMP14+EGFR
A/G RATIO: 1.5 (ref 1.2–2.2)
ALBUMIN: 4.4 g/dL (ref 3.5–5.5)
ALT: 10 IU/L (ref 0–32)
AST: 12 IU/L (ref 0–40)
Alkaline Phosphatase: 63 IU/L (ref 39–117)
BUN / CREAT RATIO: 11 (ref 9–23)
BUN: 11 mg/dL (ref 6–24)
Bilirubin Total: <0.2 mg/dL (ref 0.0–1.2)
CALCIUM: 9.9 mg/dL (ref 8.7–10.2)
CO2: 23 mmol/L (ref 18–29)
CREATININE: 0.99 mg/dL (ref 0.57–1.00)
Chloride: 103 mmol/L (ref 96–106)
GFR calc Af Amer: 78 mL/min/{1.73_m2} (ref >59)
GFR, EST NON AFRICAN AMERICAN: 68 mL/min/{1.73_m2} (ref >59)
GLOBULIN, TOTAL: 3 g/dL (ref 1.5–4.5)
Glucose: 94 mg/dL (ref 65–99)
POTASSIUM: 4.9 mmol/L (ref 3.5–5.2)
SODIUM: 141 mmol/L (ref 134–144)
TOTAL PROTEIN: 7.4 g/dL (ref 6.0–8.5)

## 2016-09-08 LAB — LIPID PANEL
CHOL/HDL RATIO: 5.7 ratio — AB (ref 0.0–4.4)
Cholesterol, Total: 177 mg/dL (ref 100–199)
HDL: 31 mg/dL — ABNORMAL LOW (ref >39)
LDL CALC: 84 mg/dL (ref 0–99)
TRIGLYCERIDES: 312 mg/dL — AB (ref 0–149)
VLDL Cholesterol Cal: 62 mg/dL — ABNORMAL HIGH (ref 5–40)

## 2016-09-25 NOTE — Progress Notes (Signed)
Cardiology Office Note    Date:  09/27/2016   ID:  GRACE VALLEY, DOB [birth year], MRN [id number]  PCP:  Arnoldo Morale, MD  Cardiologist:  Dr. Marlou Porch  CC: follow up   History of Present Illness:  Robin Walsh is a 51 y.o. female with a history of HTN, HLD, tobacco abuse, PAD s/p successful stenting of bilateral common iliac artery (12/24/16) and CAD s/p DES to LAD (11/2015) who presents to clinic for follow up.   She was admitted 8/19-8/22/17 for [place]. She underwent cath on 11/28/15 which showed 90% mLAD lesion s/p DES and normal LV function. Cath was c/b radial artery spasm. She was discharged on Aspirin, plavix, statin and BB as well as low dose amlodipine for vasospasm.  She tried to increase her physical activities after her stent placement but was limited by bilateral leg pain worse on the right side. She underwent noninvasive vascular evaluation which showed mildly reduced ABI on the right side and normal on the left. Duplex showed significant bilateral common iliac artery stenosis worse on the right side. She was seen by Dr. Fletcher Anon who proceeded with angiography which showed significant bilateral ostial common iliac artery stenosis worse on the right side with no significant outflow runoff disease. We then performed successful stenting of bilateral common iliac artery without complications. Claudication resolved.   She was last seen by Dr. Marlou Porch in 03/2016 and doing well. She was noted to have atypical chest pain. Her BP had been running soft and she had had some dizziness and Coreg was titrated down to 3.125mg  BID.   Today she presents to clinic for follow up. About a month ago she noted return of claudication in her legs. Its similar to previous symptoms but not as severe. She had a fall down her steps the other day. She bruised her tail bone. No exertional chest pain or SOB. She hasn't been exercising at all. The most exertion she gets is going to [employer] races (walking are the race  track.). She still smoking about 1/2 PPD. She has had some stress and its hard to stop. No LE edema, orthopnea or PND. No dizziness or syncope. She only takes Coreg at night otherwise she does get dizzy. No blood in stool or urine.    Past Medical History:  Diagnosis Date  . CAD (coronary artery disease)   . Hyperlipidemia   . Hypertension     Past Surgical History:  Procedure Laterality Date  . CARDIAC CATHETERIZATION N/A 11/28/2015   Procedure: Left Heart Cath and Coronary Angiography;  Surgeon: Jettie Booze, MD;  Location: [facility];  Service: Cardiovascular;  Laterality: N/A;  . CARDIAC CATHETERIZATION N/A 11/28/2015   Procedure: Coronary Stent Intervention;  Surgeon: Jettie Booze, MD;  Location: [facility];  Service: Cardiovascular;  Laterality: N/A;  DES to Mid LAD; 3.0x12 Synergy  . LEG SURGERY  2002?   tibial plateau fracture  . PERIPHERAL VASCULAR CATHETERIZATION N/A 01/04/2016   Procedure: Abdominal Aortogram;  Surgeon: Nelva Bush, MD;  Location: [facility];  Service: Cardiovascular;  Laterality: N/A;  . PERIPHERAL VASCULAR CATHETERIZATION Bilateral 01/04/2016   Procedure: Lower Extremity Angiography;  Surgeon: Nelva Bush, MD;  Location: [facility];  Service: Cardiovascular;  Laterality: Bilateral;  . PERIPHERAL VASCULAR CATHETERIZATION Bilateral 01/04/2016   Procedure: Peripheral Vascular Intervention;  Surgeon: Nelva Bush, MD;  Location: [facility];  Service: Cardiovascular;  Laterality: Bilateral;  common iliac  Current Medications: Outpatient Medications Prior to Visit  Medication Sig Dispense Refill  . albuterol (PROVENTIL HFA;VENTOLIN HFA) 108 (90 Base) MCG/ACT inhaler Inhale 2 puffs into the lungs every 6 (six) hours as needed for wheezing or shortness of breath. 1 Inhaler 0  . aspirin 81 MG chewable tablet Chew 1 tablet (81 mg total) by mouth daily. 30 tablet 11  . atorvastatin (LIPITOR) 40 MG tablet  Take 1 tablet (40 mg total) by mouth daily at 6 PM. 30 tablet 11  . clopidogrel (PLAVIX) 75 MG tablet Take 1 tablet (75 mg total) by mouth daily with breakfast. 30 tablet 11  . hydrocortisone cream 0.5 % Apply 1 application topically 2 (two) times daily. 30 g 1  . lisinopril (PRINIVIL,ZESTRIL) 5 MG tablet Take 5 mg by mouth daily.    . nitroGLYCERIN (NITROSTAT) 0.4 MG SL tablet Place 0.4 mg under the tongue every 5 (five) minutes as needed for chest pain. X 3 doses    . promethazine (PHENERGAN) 25 MG tablet Take 1 tablet (25 mg total) by mouth every 8 (eight) hours as needed for nausea or vomiting. 20 tablet 0  . terbinafine (LAMISIL AT) 1 % cream Apply 1 application topically 2 (two) times daily. 30 g 0  . cloNIDine (CATAPRES) 0.1 MG tablet Take 1 tablet (0.1 mg total) by mouth at bedtime as needed. For hot flashes 90 tablet 3  . carvedilol (COREG) 3.125 MG tablet Take 1 tablet (3.125 mg total) by mouth 2 (two) times daily with a meal. 60 tablet 8   No facility-administered medications prior to visit.      Allergies:   Codeine and Dilaudid [hydromorphone hcl]   Social History   Social History  . Marital status: Single    Spouse name: N/A  . Number of children: N/A  . Years of education: N/A   Occupational History  . clerical work    Social History Main Topics  . Smoking status: Current Every Day Smoker    Packs/day: 0.50    Years: 25.00    Types: Cigarettes  . Smokeless tobacco: Never Used  . Alcohol use No  . Drug use: Yes    Types: Marijuana     Comment: one week ago  . Sexual activity: Not Asked   Other Topics Concern  . None   Social History Narrative  . None     Family History:  The patient's family history includes Alcohol abuse (age of onset: 21) in her father; Congestive Heart Failure (age of onset: 46) in her mother; Heart disease in her brother; Liver disease in her sister.      ROS:   Please see the history of present illness.    ROS All other systems  reviewed and are negative.   PHYSICAL EXAM:   VS:  BP 122/82   Pulse 82   Ht 5\' 4"  (1.626 m)   Wt 161 lb 12.8 oz (73.4 kg)   SpO2 99%   BMI 27.77 kg/m    GEN: Well nourished, well developed, in no acute distress  HEENT: normal  Neck: no JVD, carotid bruits, or masses Cardiac: RRR; no murmurs, rubs, or gallops,no edema  Respiratory:  clear to auscultation bilaterally, normal work of breathing GI: soft, nontender, nondistended, + BS MS: no deformity or atrophy  Skin: warm and dry, no rash Neuro:  Alert and Oriented x 3, Strength and sensation are intact Psych: euthymic mood, full affect   Wt Readings from Last 3 Encounters:  09/27/16 161  lb 12.8 oz (73.4 kg)  08/28/16 159 lb (72.1 kg)  07/31/16 157 lb 9.6 oz (71.5 kg)      Studies/Labs Reviewed:   EKG:  EKG is NOT ordered today.    Recent Labs: 11/27/2015: TSH 2.320 01/04/2016: Hemoglobin 14.4; Platelets 288 09/07/2016: ALT 10; BUN 11; Creatinine, Ser 0.99; Potassium 4.9; Sodium 141   Lipid Panel    Component Value Date/Time   CHOL 177 09/07/2016 0854   TRIG 312 (H) 09/07/2016 0854   HDL 31 (L) 09/07/2016 0854   CHOLHDL 5.7 (H) 09/07/2016 0854   CHOLHDL 7.8 11/27/2015 0453   VLDL 66 (H) 11/27/2015 0453   LDLCALC 84 09/07/2016 0854    Additional studies/ records that were reviewed today include:  Procedures 11/28/15 Coronary Stent Intervention  Left Heart Cath and Coronary Angiography  Conclusion    Mid LAD lesion, 90 %stenosed. A STENT SYNERGY DES 3X12 drug eluting stent was successfully placed.  Post intervention, there is a 0% residual stenosis  The left ventricular systolic function is normal.The left ventricular ejection fraction is 55-65% by visual estimate.  LV end diastolic pressure is normal.  There is no aortic valve stenosis.  Of note, she had radial artery vasospasm. The PCI was performed through a 5 Fr guide catheter. Continue dual antiplatelet therapy for at least a year. Continue  aggressive secondary prevention including smoking cessation.     01/04/16 Abdominal Aortogram  Lower Extremity Angiography  Peripheral Vascular Intervention  Conclusion   Conclusions: 1. Significant bilateral ostial common iliac artery stenoses (90% on the right and 70% on the left). 2. No significant outflow or runoff disease in either lower extremity. 3. Successful stenting of bilateral ostial common iliac artery stenoses with Express LD 7.0 x 27 mm balloon expandable bare-metal stents using a kissing stent technique with 0% residual stenosis.  Recommendations: 1. Continue dual antiplatelet therapy with aspirin and clopidogrel. 2. Aggressive secondary prevention including continued smoking cessation. 3. Follow-up with Dr. Fletcher Anon as an outpatient.     ASSESSMENT & PLAN:   CAD s/p DES to LAD (11/2015): stable. Continue ASA/plavix, statin and BB (only taking Coreg once a day at night to eliminate dizziness)  PAD s/p bilateral common iliac artery stenting (12/24/16): having some return of claudication. She see's Dr. Fletcher Anon next week.   HTN: BP well controlled currently  HLD: LDL improved from 124--> 84. Goal <70. Continue statin. Followed by PCP  Tobacco abuse: still smoking 1/2 PPD. She has too much stress to quit right now.    Medication Adjustments/Labs and Tests Ordered: Current medicines are reviewed at length with the patient today.  Concerns regarding medicines are outlined above.  Medication changes, Labs and Tests ordered today are listed in the Patient Instructions below. Patient Instructions  Medication Instructions:  Your physician recommends that you continue on your current medications as directed. Please refer to the Current Medication list given to you today.   Labwork: None ordered  Testing/Procedures: None ordered  Follow-Up: Your physician wants you to follow-up in: [place] will receive a reminder letter in the mail two months in  advance. If you don't receive a letter, please call our office to schedule the follow-up appointment.    Any Other Special Instructions Will Be Listed Below (If Applicable).     If you need a refill on your cardiac medications before your next appointment, please call your pharmacy.      Signed, Angelena Form, PA-C  09/27/2016 8:41 AM  [facility] [address], [place], [place]  [address] Phone: [phone]; Fax: [phone]

## 2016-09-27 ENCOUNTER — Encounter: Payer: Self-pay | Admitting: Physician Assistant

## 2016-09-27 ENCOUNTER — Ambulatory Visit (INDEPENDENT_AMBULATORY_CARE_PROVIDER_SITE_OTHER): Payer: Self-pay | Admitting: Physician Assistant

## 2016-09-27 VITALS — BP 122/82 | HR 82 | Ht 64.0 in | Wt 161.8 lb

## 2016-09-27 DIAGNOSIS — I25119 Atherosclerotic heart disease of native coronary artery with unspecified angina pectoris: Secondary | ICD-10-CM

## 2016-09-27 DIAGNOSIS — E785 Hyperlipidemia, unspecified: Secondary | ICD-10-CM

## 2016-09-27 DIAGNOSIS — I10 Essential (primary) hypertension: Secondary | ICD-10-CM

## 2016-09-27 DIAGNOSIS — I739 Peripheral vascular disease, unspecified: Secondary | ICD-10-CM

## 2016-09-27 DIAGNOSIS — Z720 Tobacco use: Secondary | ICD-10-CM

## 2016-09-27 NOTE — Patient Instructions (Addendum)

## 2016-10-02 ENCOUNTER — Encounter: Payer: Self-pay | Admitting: Cardiovascular Disease

## 2016-10-02 ENCOUNTER — Ambulatory Visit (INDEPENDENT_AMBULATORY_CARE_PROVIDER_SITE_OTHER): Payer: Self-pay | Admitting: Cardiovascular Disease

## 2016-10-02 VITALS — BP 140/90 | HR 95 | Ht 64.5 in | Wt 161.2 lb

## 2016-10-02 DIAGNOSIS — Z95828 Presence of other vascular implants and grafts: Secondary | ICD-10-CM

## 2016-10-02 DIAGNOSIS — E785 Hyperlipidemia, unspecified: Secondary | ICD-10-CM

## 2016-10-02 DIAGNOSIS — I739 Peripheral vascular disease, unspecified: Secondary | ICD-10-CM

## 2016-10-02 DIAGNOSIS — Z720 Tobacco use: Secondary | ICD-10-CM

## 2016-10-02 NOTE — Patient Instructions (Addendum)
Medication Instructions:  Your physician recommends that you continue on your current medications as directed. Please refer to the Current Medication list given to you today.  Labwork: none  Testing/Procedures: Your physician has requested that you have an ankle brachial index (ABI). During this test an ultrasound and blood pressure cuff are used to evaluate the arteries that supply the arms and legs with blood. Allow thirty minutes for this exam. There are no restrictions or special instructions.  VAS US AORTA/IVC/ILIACS   Follow-Up: Your physician wants you to follow-up in: 6 month ov You will receive a reminder letter in the mail two months in advance. If you don't receive a letter, please call our office to schedule the follow-up appointment.  Any Other Special Instructions Will Be Listed Below (If Applicable).  Steps to Quit Smoking Smoking tobacco can be harmful to your health and can affect almost every organ in your body. Smoking puts you, and those around you, at risk for developing many serious chronic diseases. Quitting smoking is difficult, but it is one of the best things that you can do for your health. It is never too late to quit. What are the benefits of quitting smoking? When you quit smoking, you lower your risk of developing serious diseases and conditions, such as:  Lung cancer or lung disease, such as COPD.  Heart disease.  Stroke.  Heart attack.  Infertility.  Osteoporosis and bone fractures.  Additionally, symptoms such as coughing, wheezing, and shortness of breath may get better when you quit. You may also find that you get sick less often because your body is stronger at fighting off colds and infections. If you are pregnant, quitting smoking can help to reduce your chances of having a baby of low birth weight. How do I get ready to quit? When you decide to quit smoking, create a plan to make sure that you are successful. Before you quit:  Pick a date  to quit. Set a date within the next two weeks to give you time to prepare.  Write down the reasons why you are quitting. Keep this list in places where you will see it often, such as on your bathroom mirror or in your car or wallet.  Identify the people, places, things, and activities that make you want to smoke (triggers) and avoid them. Make sure to take these actions: ? Throw away all cigarettes at home, at work, and in your car. ? Throw away smoking accessories, such as Scientist, research (medical). ? Clean your car and make sure to empty the ashtray. ? Clean your home, including curtains and carpets.  Tell your family, friends, and coworkers that you are quitting. Support from your loved ones can make quitting easier.  Talk with your health care provider about your options for quitting smoking.  Find out what treatment options are covered by your health insurance.  What strategies can I use to quit smoking? Talk with your healthcare provider about different strategies to quit smoking. Some strategies include:  Quitting smoking altogether instead of gradually lessening how much you smoke over a period of time. Research shows that quitting "cold [place]" is more successful than gradually quitting.  Attending in-person counseling to help you build problem-solving skills. You are more likely to have success in quitting if you attend several counseling sessions. Even short sessions of 10 minutes can be effective.  Finding resources and support systems that can help you to quit smoking and remain smoke-free after you quit. These resources  are most helpful when you use them often. They can include: ? Online chats with a Social worker. ? Telephone quitlines. ? Careers information officer. ? Support groups or group counseling. ? Text messaging programs. ? Mobile phone applications.  Taking medicines to help you quit smoking. (If you are pregnant or breastfeeding, talk with your health care provider  first.) Some medicines contain nicotine and some do not. Both types of medicines help with cravings, but the medicines that include nicotine help to relieve withdrawal symptoms. Your health care provider may recommend: ? Nicotine patches, gum, or lozenges. ? Nicotine inhalers or sprays. ? Non-nicotine medicine that is taken by mouth.  Talk with your health care provider about combining strategies, such as taking medicines while you are also receiving in-person counseling. Using these two strategies together makes you more likely to succeed in quitting than if you used either strategy on its own. If you are pregnant or breastfeeding, talk with your health care provider about finding counseling or other support strategies to quit smoking. Do not take medicine to help you quit smoking unless told to do so by your health care provider. What things can I do to make it easier to quit? Quitting smoking might feel overwhelming at first, but there is a lot that you can do to make it easier. Take these important actions:  Reach out to your family and friends and ask that they support and encourage you during this time. Call telephone quitlines, reach out to support groups, or work with a counselor for support.  Ask people who smoke to avoid smoking around you.  Avoid places that trigger you to smoke, such as bars, parties, or smoke-break areas at work.  Spend time around people who do not smoke.  Lessen stress in your life, because stress can be a smoking trigger for some people. To lessen stress, try: ? Exercising regularly. ? Deep-breathing exercises. ? Yoga. ? Meditating. ? Performing a body scan. This involves closing your eyes, scanning your body from head to toe, and noticing which parts of your body are particularly tense. Purposefully relax the muscles in those areas.  Download or purchase mobile phone or tablet apps (applications) that can help you stick to your quit plan by providing  reminders, tips, and encouragement. There are many free apps, such as QuitGuide from the [employer] Office manager for Disease Control and Prevention). You can find other support for quitting smoking (smoking cessation) through smokefree.gov and other websites.  How will I feel when I quit smoking? Within the first 24 hours of quitting smoking, you may start to feel some withdrawal symptoms. These symptoms are usually most noticeable 2-3 days after quitting, but they usually do not last beyond 2-3 weeks. Changes or symptoms that you might experience include:  Mood swings.  Restlessness, anxiety, or irritation.  Difficulty concentrating.  Dizziness.  Strong cravings for sugary foods in addition to nicotine.  Mild weight gain.  Constipation.  Nausea.  Coughing or a sore throat.  Changes in how your medicines work in your body.  A depressed mood.  Difficulty sleeping (insomnia).  After the first 2-3 weeks of quitting, you may start to notice more positive results, such as:  Improved sense of smell and taste.  Decreased coughing and sore throat.  Slower heart rate.  Lower blood pressure.  Clearer skin.  The ability to breathe more easily.  Fewer sick days.  Quitting smoking is very challenging for most people. Do not get discouraged if you are not successful  the first time. Some people need to make many attempts to quit before they achieve long-term success. Do your best to stick to your quit plan, and talk with your health care provider if you have any questions or concerns. This information is not intended to replace advice given to you by your health care provider. Make sure you discuss any questions you have with your health care provider. Document Released: 03/20/2001 Document Revised: 11/22/2015 Document Reviewed: 08/10/2014 Elsevier Interactive Patient Education  2017 [employer].

## 2016-10-02 NOTE — Progress Notes (Signed)
Cardiology Office Note   Date:  10/02/2016   ID:  Robin Walsh, DOB 05-04-1965, MRN [id number]  PCP:  Arnoldo Morale, MD  Cardiologist:  Dr. Marlou Porch  Chief Complaint  Patient presents with  . Follow-up      History of Present Illness: Robin Walsh is a 51 y.o. female who presents for a follow-up visit regarding  peripheral arterial disease. She has known history of coronary artery disease, hypertension, hyperlipidemia and tobacco use. She has known history of coronary artery disease status post LAD PCI in 2017. She is status post bilateral common iliac artery kissing stent placement in 12/2015 for severe claudication. She had no significant infrainguinal disease. She reports mild right leg claudication which started about one month ago. This is not as severe as her initial presentation. No symptoms on the left side. Unfortunately, she continues to smoke and reports significant hot flashes and stress. She has been taking his medications regularly.    Past Medical History:  Diagnosis Date  . CAD (coronary artery disease)   . Hyperlipidemia   . Hypertension     Past Surgical History:  Procedure Laterality Date  . CARDIAC CATHETERIZATION N/A 11/28/2015   Procedure: Left Heart Cath and Coronary Angiography;  Surgeon: Robin Booze, MD;  Location: [facility];  Service: Cardiovascular;  Laterality: N/A;  . CARDIAC CATHETERIZATION N/A 11/28/2015   Procedure: Coronary Stent Intervention;  Surgeon: Robin Booze, MD;  Location: [facility];  Service: Cardiovascular;  Laterality: N/A;  DES to Mid LAD; 3.0x12 Synergy  . LEG SURGERY  2002?   tibial plateau fracture  . PERIPHERAL VASCULAR CATHETERIZATION N/A 01/04/2016   Procedure: Abdominal Aortogram;  Surgeon: Robin Bush, MD;  Location: [facility];  Service: Cardiovascular;  Laterality: N/A;  . PERIPHERAL VASCULAR CATHETERIZATION Bilateral 01/04/2016   Procedure: Lower Extremity Angiography;   Surgeon: Robin Bush, MD;  Location: [facility];  Service: Cardiovascular;  Laterality: Bilateral;  . PERIPHERAL VASCULAR CATHETERIZATION Bilateral 01/04/2016   Procedure: Peripheral Vascular Intervention;  Surgeon: Robin Bush, MD;  Location: [facility];  Service: Cardiovascular;  Laterality: Bilateral;  common iliac     Current Outpatient Prescriptions  Medication Sig Dispense Refill  . albuterol (PROVENTIL HFA;VENTOLIN HFA) 108 (90 Base) MCG/ACT inhaler Inhale 2 puffs into the lungs every 6 (six) hours as needed for wheezing or shortness of breath. 1 Inhaler 0  . aspirin 81 MG chewable tablet Chew 1 tablet (81 mg total) by mouth daily. 30 tablet 11  . atorvastatin (LIPITOR) 40 MG tablet Take 1 tablet (40 mg total) by mouth daily at 6 PM. 30 tablet 11  . carvedilol (COREG) 3.125 MG tablet Take 3.125 mg by mouth daily.    . clopidogrel (PLAVIX) 75 MG tablet Take 1 tablet (75 mg total) by mouth daily with breakfast. 30 tablet 11  . lisinopril (PRINIVIL,ZESTRIL) 5 MG tablet Take 5 mg by mouth daily.    . nitroGLYCERIN (NITROSTAT) 0.4 MG SL tablet Place 0.4 mg under the tongue every 5 (five) minutes as needed for chest pain. X 3 doses    . promethazine (PHENERGAN) 25 MG tablet Take 1 tablet (25 mg total) by mouth every 8 (eight) hours as needed for nausea or vomiting. 20 tablet 0   No current facility-administered medications for this visit.     Allergies:   Codeine and Dilaudid [hydromorphone hcl]    Social History:  The patient  reports that she has been  smoking Cigarettes.  She has a 12.50 pack-year smoking history. She has never used smokeless tobacco. She reports that she uses drugs, including Marijuana. She reports that she does not drink alcohol.   Family History:  The patient's family history includes Alcohol abuse (age of onset: 22) in her father; Congestive Heart Failure (age of onset: 5) in her mother; Heart disease in her brother; Liver disease in her  sister.    ROS:  Please see the history of present illness.   Otherwise, review of systems are positive for none.   All other systems are reviewed and negative.    PHYSICAL EXAM: VS:  BP 140/90   Pulse 95   Ht 5' 4.5" (1.638 m)   Wt 161 lb 3.2 oz (73.1 kg)   BMI 27.24 kg/m  , BMI Body mass index is 27.24 kg/m. GEN: Well nourished, well developed, in no acute distress  HEENT: normal  Neck: no JVD, carotid bruits, or masses Cardiac: RRR; no murmurs, rubs, or gallops,no edema  Respiratory:  clear to auscultation bilaterally, normal work of breathing GI: soft, nontender, nondistended, + BS MS: no deformity or atrophy  Skin: warm and dry, no rash Neuro:  Strength and sensation are intact Psych: euthymic mood, full affect Vascular: Femoral pulses +1 on the right side and +2 on the left side. Distal pulses are +2.  EKG:  EKG is not ordered today.   Recent Labs: 11/27/2015: TSH 2.320 01/04/2016: Hemoglobin 14.4; Platelets 288 09/07/2016: ALT 10; BUN 11; Creatinine, Ser 0.99; Potassium 4.9; Sodium 141    Lipid Panel    Component Value Date/Time   CHOL 177 09/07/2016 0854   TRIG 312 (H) 09/07/2016 0854   HDL 31 (L) 09/07/2016 0854   CHOLHDL 5.7 (H) 09/07/2016 0854   CHOLHDL 7.8 11/27/2015 0453   VLDL 66 (H) 11/27/2015 0453   LDLCALC 84 09/07/2016 0854      Wt Readings from Last 3 Encounters:  10/02/16 161 lb 3.2 oz (73.1 kg)  09/27/16 161 lb 12.8 oz (73.4 kg)  08/28/16 159 lb (72.1 kg)       No flowsheet data found.    ASSESSMENT AND PLAN:  1.  Peripheral arterial disease:  She reports recurrent right leg claudication of recent onset but not as severe as initial presentation. Right femoral pulse is palpable although mildly reduced. I requested an aortoiliac duplex and ABIs. I advised her to start a walking program.  2. Tobacco use: I again discussed with her the importance of smoking cessation in order to prevent PAD progression.  3. Coronary artery disease  involving native coronary arteries with other forms of angina:   Continue medical therapy.  4. Hyperlipidemia: Continue treatment with atorvastatin with a target LDL of less than 70.  Disposition:   FU with me in 6 month  Signed,  [name], MD  10/02/2016 10:12 AM    Robin Walsh

## 2016-10-11 MED FILL — ?ATORVASTATIN 40MG TABLET: 40 | 30 days supply | Qty: 30 | Fill #10

## 2016-10-11 MED FILL — ?CLOPIDOGREL 75 MG TABLET: 75 | 30 days supply | Qty: 30 | Fill #10

## 2016-10-17 ENCOUNTER — Ambulatory Visit: Payer: Self-pay | Attending: Family Medicine

## 2016-11-08 MED FILL — ?ATORVASTATIN 40MG TABLET: 40 | 30 days supply | Qty: 30 | Fill #11

## 2016-11-08 MED FILL — ?CLOPIDOGREL 75MG TAB: 75 | 30 days supply | Qty: 30 | Fill #11

## 2016-11-21 ENCOUNTER — Ambulatory Visit: Payer: Self-pay | Admitting: Family Medicine

## 2016-11-27 ENCOUNTER — Encounter: Payer: Self-pay | Admitting: Family Medicine

## 2016-11-27 ENCOUNTER — Ambulatory Visit: Payer: Self-pay | Attending: Family Medicine | Admitting: Family Medicine

## 2016-11-27 VITALS — BP 114/75 | HR 81 | Temp 98.2°F | Ht 64.5 in | Wt 166.4 lb

## 2016-11-27 DIAGNOSIS — Z87891 Personal history of nicotine dependence: Secondary | ICD-10-CM | POA: Insufficient documentation

## 2016-11-27 DIAGNOSIS — G4700 Insomnia, unspecified: Secondary | ICD-10-CM | POA: Insufficient documentation

## 2016-11-27 DIAGNOSIS — Z95820 Peripheral vascular angioplasty status with implants and grafts: Secondary | ICD-10-CM | POA: Insufficient documentation

## 2016-11-27 DIAGNOSIS — Z7902 Long term (current) use of antithrombotics/antiplatelets: Secondary | ICD-10-CM | POA: Insufficient documentation

## 2016-11-27 DIAGNOSIS — Z7982 Long term (current) use of aspirin: Secondary | ICD-10-CM | POA: Insufficient documentation

## 2016-11-27 DIAGNOSIS — Z9861 Coronary angioplasty status: Secondary | ICD-10-CM

## 2016-11-27 DIAGNOSIS — I10 Essential (primary) hypertension: Secondary | ICD-10-CM | POA: Insufficient documentation

## 2016-11-27 DIAGNOSIS — I2510 Atherosclerotic heart disease of native coronary artery without angina pectoris: Secondary | ICD-10-CM | POA: Insufficient documentation

## 2016-11-27 DIAGNOSIS — Z79899 Other long term (current) drug therapy: Secondary | ICD-10-CM | POA: Insufficient documentation

## 2016-11-27 DIAGNOSIS — E785 Hyperlipidemia, unspecified: Secondary | ICD-10-CM | POA: Insufficient documentation

## 2016-11-27 DIAGNOSIS — J40 Bronchitis, not specified as acute or chronic: Secondary | ICD-10-CM | POA: Insufficient documentation

## 2016-11-27 DIAGNOSIS — I739 Peripheral vascular disease, unspecified: Secondary | ICD-10-CM | POA: Insufficient documentation

## 2016-11-27 DIAGNOSIS — N951 Menopausal and female climacteric states: Secondary | ICD-10-CM | POA: Insufficient documentation

## 2016-11-27 MED ORDER — CLONIDINE HCL 0.2 MG PO TABS: 0.2000 mg | ORAL_TABLET | Freq: Every day | ORAL | 3 refills | Status: DC

## 2016-11-27 MED ORDER — ALBUTEROL SULFATE HFA 108 (90 BASE) MCG/ACT IN AERS: 2.0000 | INHALATION_SPRAY | Freq: Four times a day (QID) | RESPIRATORY_TRACT | 3 refills | Status: DC | PRN

## 2016-11-27 MED ORDER — ATORVASTATIN CALCIUM 40 MG PO TABS: 40.0000 mg | ORAL_TABLET | Freq: Every day | ORAL | 3 refills | Status: DC

## 2016-11-27 MED ORDER — VENLAFAXINE HCL ER 75 MG PO CP24: 75.0000 mg | ORAL_CAPSULE | Freq: Every day | ORAL | 3 refills | Status: DC

## 2016-11-27 MED ORDER — CLOPIDOGREL BISULFATE 75 MG PO TABS: 75.0000 mg | ORAL_TABLET | Freq: Every day | ORAL | 3 refills | Status: DC

## 2016-11-27 MED ORDER — LISINOPRIL 2.5 MG PO TABS: 2.5000 mg | ORAL_TABLET | Freq: Every day | ORAL | 3 refills | Status: DC

## 2016-11-27 MED ORDER — CARVEDILOL 3.125 MG PO TABS: 3.1250 mg | ORAL_TABLET | Freq: Every day | ORAL | 3 refills | Status: DC

## 2016-11-27 MED FILL — ?CLOPIDOGREL 75 MG TABLET: 75 | 30 days supply | Qty: 30 | Fill #0

## 2016-11-27 MED FILL — ?ATORVASTATIN 40MG TABLET: 40 | 30 days supply | Qty: 30 | Fill #0

## 2016-11-27 MED FILL — cloNIDine HCL 0.2 MG TABS: 0.2 | 30 days supply | Qty: 30 | Fill #0

## 2016-11-27 MED FILL — ?CARVEDILOL 3.125 MG TABLET: 3.125 | 30 days supply | Qty: 30 | Fill #0

## 2016-11-27 MED FILL — VENLAFAXINE HCL ER 75 MG CA: 75 | 30 days supply | Qty: 30 | Fill #0

## 2016-11-27 MED FILL — LISINOPRIL 2.5 MG TABLET: 2.5 | 30 days supply | Qty: 30 | Fill #0

## 2016-11-27 MED FILL — !VENTOLIN HFA INHALER: 108 (90 BAS | 30 days supply | Qty: 18 | Fill #0

## 2016-11-27 NOTE — Patient Instructions (Signed)
Menopause Menopause is the normal time of life when menstrual periods stop completely. Menopause is complete when you have missed 12 consecutive menstrual periods. It usually occurs between the ages of 48 years and 55 years. Very rarely does a woman develop menopause before the age of 40 years. At menopause, your ovaries stop producing the female hormones estrogen and progesterone. This can cause undesirable symptoms and also affect your health. Sometimes the symptoms may occur 4-5 years before the menopause begins. There is no relationship between menopause and:  Oral contraceptives.  Number of children you had.  Race.  The age your menstrual periods started (menarche).  Heavy smokers and very thin women may develop menopause earlier in life. What are the causes?  The ovaries stop producing the female hormones estrogen and progesterone. Other causes include:  Surgery to remove both ovaries.  The ovaries stop functioning for no known reason.  Tumors of the pituitary gland in the brain.  Medical disease that affects the ovaries and hormone production.  Radiation treatment to the abdomen or pelvis.  Chemotherapy that affects the ovaries.  What are the signs or symptoms?  Hot flashes.  Night sweats.  Decrease in sex drive.  Vaginal dryness and thinning of the vagina causing painful intercourse.  Dryness of the skin and developing wrinkles.  Headaches.  Tiredness.  Irritability.  Memory problems.  Weight gain.  Bladder infections.  Hair growth of the face and chest.  Infertility. More serious symptoms include:  Loss of bone (osteoporosis) causing breaks (fractures).  Depression.  Hardening and narrowing of the arteries (atherosclerosis) causing heart attacks and strokes.  How is this diagnosed?  When the menstrual periods have stopped for 12 straight months.  Physical exam.  Hormone studies of the blood. How is this treated? There are many treatment  choices and nearly as many questions about them. The decisions to treat or not to treat menopausal changes is an individual choice made with your health care provider. Your health care provider can discuss the treatments with you. Together, you can decide which treatment will work best for you. Your treatment choices may include:  Hormone therapy (estrogen and progesterone).  Non-hormonal medicines.  Treating the individual symptoms with medicine (for example antidepressants for depression).  Herbal medicines that may help specific symptoms.  Counseling by a psychiatrist or psychologist.  Group therapy.  Lifestyle changes including: ? Eating healthy. ? Regular exercise. ? Limiting caffeine and alcohol. ? Stress management and meditation.  No treatment.  Follow these instructions at home:  Take the medicine your health care provider gives you as directed.  Get plenty of sleep and rest.  Exercise regularly.  Eat a diet that contains calcium (good for the bones) and soy products (acts like estrogen hormone).  Avoid alcoholic beverages.  Do not smoke.  If you have hot flashes, dress in layers.  Take supplements, calcium, and vitamin D to strengthen bones.  You can use over-the-counter lubricants or moisturizers for vaginal dryness.  Group therapy is sometimes very helpful.  Acupuncture may be helpful in some cases. Contact a health care provider if:  You are not sure you are in menopause.  You are having menopausal symptoms and need advice and treatment.  You are still having menstrual periods after age 55 years.  You have pain with intercourse.  Menopause is complete (no menstrual period for 12 months) and you develop vaginal bleeding.  You need a referral to a specialist (gynecologist, psychiatrist, or psychologist) for treatment. Get help right   away if:  You have severe depression.  You have excessive vaginal bleeding.  You fell and think you have a  broken bone.  You have pain when you urinate.  You develop leg or chest pain.  You have a fast pounding heart beat (palpitations).  You have severe headaches.  You develop vision problems.  You feel a lump in your breast.  You have abdominal pain or severe indigestion. This information is not intended to replace advice given to you by your health care provider. Make sure you discuss any questions you have with your health care provider. Document Released: 06/16/2003 Document Revised: 09/01/2015 Document Reviewed: 10/23/2012 Elsevier Interactive Patient Education  2017 [employer]  

## 2016-11-27 NOTE — Progress Notes (Signed)
Subjective:  Patient ID: Robin Walsh, female    DOB: 1965/07/03  Age: 51 y.o. MRN: [id number]  CC: Hot Flashes   HPI Robin Walsh is a 51 year old female with a history of tobacco abuse, coronary artery disease (status post PCI and drug-eluting stent placement to LAD in 11/2015) peripheral arterial disease (status post stenting of bilateral common iliac artery) who presents today for follow-up visit.  She has complained of hot flashes at her last visit for which she received Clonidine but complained symptoms are uncontrolled. She is also able to sleep for only 2 hours.  She does have intermittent claudication pains in her legs and is scheduled for a vascular ultrasound in 2 days.  She denies chest pain, shortness of breath and went to see her Cardiologist last month.  Continues to smoke and is not ready to quit. Uses Proventil as needed when it is humid and this controls her symptoms.  Past Medical History:  Diagnosis Date  . CAD (coronary artery disease)   . Hyperlipidemia   . Hypertension     Past Surgical History:  Procedure Laterality Date  . CARDIAC CATHETERIZATION N/A 11/28/2015   Procedure: Left Heart Cath and Coronary Angiography;  Surgeon: Jettie Booze, MD;  Location: [facility];  Service: Cardiovascular;  Laterality: N/A;  . CARDIAC CATHETERIZATION N/A 11/28/2015   Procedure: Coronary Stent Intervention;  Surgeon: Jettie Booze, MD;  Location: [facility];  Service: Cardiovascular;  Laterality: N/A;  DES to Mid LAD; 3.0x12 Synergy  . LEG SURGERY  2002?   tibial plateau fracture  . PERIPHERAL VASCULAR CATHETERIZATION N/A 01/04/2016   Procedure: Abdominal Aortogram;  Surgeon: Nelva Bush, MD;  Location: [facility];  Service: Cardiovascular;  Laterality: N/A;  . PERIPHERAL VASCULAR CATHETERIZATION Bilateral 01/04/2016   Procedure: Lower Extremity Angiography;  Surgeon: Nelva Bush, MD;  Location: [facility];  Service:  Cardiovascular;  Laterality: Bilateral;  . PERIPHERAL VASCULAR CATHETERIZATION Bilateral 01/04/2016   Procedure: Peripheral Vascular Intervention;  Surgeon: Nelva Bush, MD;  Location: [facility];  Service: Cardiovascular;  Laterality: Bilateral;  common iliac    Allergies  Allergen Reactions  . Codeine Nausea And Vomiting  . Dilaudid [Hydromorphone Hcl] Nausea And Vomiting     Outpatient Medications Prior to Visit  Medication Sig Dispense Refill  . aspirin 81 MG chewable tablet Chew 1 tablet (81 mg total) by mouth daily. 30 tablet 11  . nitroGLYCERIN (NITROSTAT) 0.4 MG SL tablet Place 0.4 mg under the tongue every 5 (five) minutes as needed for chest pain. X 3 doses    . albuterol (PROVENTIL HFA;VENTOLIN HFA) 108 (90 Base) MCG/ACT inhaler Inhale 2 puffs into the lungs every 6 (six) hours as needed for wheezing or shortness of breath. 1 Inhaler 0  . atorvastatin (LIPITOR) 40 MG tablet Take 1 tablet (40 mg total) by mouth daily at 6 PM. 30 tablet 11  . carvedilol (COREG) 3.125 MG tablet Take 3.125 mg by mouth daily.    . clopidogrel (PLAVIX) 75 MG tablet Take 1 tablet (75 mg total) by mouth daily with breakfast. 30 tablet 11  . lisinopril (PRINIVIL,ZESTRIL) 5 MG tablet Take 5 mg by mouth daily.    . promethazine (PHENERGAN) 25 MG tablet Take 1 tablet (25 mg total) by mouth every 8 (eight) hours as needed for nausea or vomiting. 20 tablet 0   No facility-administered medications prior to visit.     ROS Review of Systems  Constitutional:  Negative for activity change, appetite change and fatigue.  HENT: Negative for congestion, sinus pressure and sore throat.   Eyes: Negative for visual disturbance.  Respiratory: Negative for cough, chest tightness, shortness of breath and wheezing.   Cardiovascular: Negative for chest pain and palpitations.  Gastrointestinal: Negative for abdominal distention, abdominal pain and constipation.  Endocrine: Negative for polydipsia.    Genitourinary: Negative for dysuria and frequency.  Musculoskeletal: Negative for arthralgias and back pain.  Skin: Negative for rash.  Neurological: Negative for tremors, light-headedness and numbness.  Hematological: Does not bruise/bleed easily.  Psychiatric/Behavioral: Negative for agitation and behavioral problems.    Objective:  BP 114/75   Pulse 81   Temp 98.2 F (36.8 C) (Oral)   Ht 5' 4.5" (1.638 m)   Wt 166 lb 6.4 oz (75.5 kg)   SpO2 99%   BMI 28.12 kg/m   BP/Weight 11/27/2016 10/02/2016 1/95/0932  Systolic BP 671 245 809  Diastolic BP 75 90 82  Wt. (Lbs) 166.4 161.2 161.8  BMI 28.12 27.24 27.77      Physical Exam  Constitutional: She is oriented to person, place, and time. She appears well-developed and well-nourished.  Cardiovascular: Normal rate, normal heart sounds and intact distal pulses.   No murmur heard. Pulmonary/Chest: Effort normal and breath sounds normal. She has no wheezes. She has no rales. She exhibits no tenderness.  Abdominal: Soft. Bowel sounds are normal. She exhibits no distension and no mass. There is no tenderness.  Musculoskeletal: Normal range of motion.  Neurological: She is alert and oriented to person, place, and time.  Skin: Skin is warm and dry.  Psychiatric: She has a normal mood and affect.     Assessment & Plan:   1. Essential hypertension Controlled Decrease dose of lisinopril given I have placed her on clonidine to prevent hypotension - lisinopril (PRINIVIL,ZESTRIL) 2.5 MG tablet; Take 1 tablet (2.5 mg total) by mouth daily.  Dispense: 30 tablet; Refill: 3 - carvedilol (COREG) 3.125 MG tablet; Take 1 tablet (3.125 mg total) by mouth daily.  Dispense: 60 tablet; Refill: 3  2. Bronchitis Uses MDI as needed when humidity is high If she continues to have respiratory symptoms she would need PFTs to evaluate for COPD given history of smoking - albuterol (PROVENTIL HFA;VENTOLIN HFA) 108 (90 Base) MCG/ACT inhaler; Inhale 2  puffs into the lungs every 6 (six) hours as needed for wheezing or shortness of breath.  Dispense: 1 Inhaler; Refill: 3  3. CAD S/P DES PCI TO mLAD - Synergy DES 3.0 mm x 12 mm Risk factor modification - atorvastatin (LIPITOR) 40 MG tablet; Take 1 tablet (40 mg total) by mouth daily at 6 PM.  Dispense: 30 tablet; Refill: 3 - clopidogrel (PLAVIX) 75 MG tablet; Take 1 tablet (75 mg total) by mouth daily with breakfast.  Dispense: 30 tablet; Refill: 3  4. PAD (peripheral artery disease) ([place]) Does have intermittent claudication pains Keep appointment with vascular  5. Hot flashes, menopausal Uncontrolled and so she discontinued clonidine Increased dose of clonidine which will help with insomnia Effexor added to regimen We have discussed risk and benefit of HRT) she wishes to see GYN for hormonal treatment. - cloNIDine (CATAPRES) 0.2 MG tablet; Take 1 tablet (0.2 mg total) by mouth at bedtime. For hot flashes  Dispense: 30 tablet; Refill: 3 - venlafaxine XR (EFFEXOR XR) 75 MG 24 hr capsule; Take 1 capsule (75 mg total) by mouth daily with breakfast.  Dispense: 30 capsule; Refill: 3 - Ambulatory referral to Gynecology  Meds ordered this encounter  Medications  . albuterol (PROVENTIL HFA;VENTOLIN HFA) 108 (90 Base) MCG/ACT inhaler    Sig: Inhale 2 puffs into the lungs every 6 (six) hours as needed for wheezing or shortness of breath.    Dispense:  1 Inhaler    Refill:  3  . lisinopril (PRINIVIL,ZESTRIL) 2.5 MG tablet    Sig: Take 1 tablet (2.5 mg total) by mouth daily.    Dispense:  30 tablet    Refill:  3    Discontinue previous dose  . atorvastatin (LIPITOR) 40 MG tablet    Sig: Take 1 tablet (40 mg total) by mouth daily at 6 PM.    Dispense:  30 tablet    Refill:  3  . clopidogrel (PLAVIX) 75 MG tablet    Sig: Take 1 tablet (75 mg total) by mouth daily with breakfast.    Dispense:  30 tablet    Refill:  3  . carvedilol (COREG) 3.125 MG tablet    Sig: Take 1 tablet (3.125 mg  total) by mouth daily.    Dispense:  60 tablet    Refill:  3  . cloNIDine (CATAPRES) 0.2 MG tablet    Sig: Take 1 tablet (0.2 mg total) by mouth at bedtime. For hot flashes    Dispense:  30 tablet    Refill:  3  . venlafaxine XR (EFFEXOR XR) 75 MG 24 hr capsule    Sig: Take 1 capsule (75 mg total) by mouth daily with breakfast.    Dispense:  30 capsule    Refill:  3    Follow-up: Return in about 3 months (around 02/27/2017) for Follow-up of chronic medical conditions.   Arnoldo Morale MD

## 2016-11-29 ENCOUNTER — Ambulatory Visit (HOSPITAL_COMMUNITY)
Admission: RE | Admit: 2016-11-29 | Discharge: 2016-11-29 | Disposition: A | Discharge status: Home / Self Care | Payer: Self-pay | Source: Ambulatory Visit | Attending: Cardiology | Admitting: Cardiology

## 2016-11-29 DIAGNOSIS — I2510 Atherosclerotic heart disease of native coronary artery without angina pectoris: Secondary | ICD-10-CM | POA: Insufficient documentation

## 2016-11-29 DIAGNOSIS — I739 Peripheral vascular disease, unspecified: Secondary | ICD-10-CM | POA: Insufficient documentation

## 2016-11-29 DIAGNOSIS — I10 Essential (primary) hypertension: Secondary | ICD-10-CM | POA: Insufficient documentation

## 2016-11-29 DIAGNOSIS — I700 Atherosclerosis of aorta: Secondary | ICD-10-CM | POA: Insufficient documentation

## 2016-11-29 DIAGNOSIS — E785 Hyperlipidemia, unspecified: Secondary | ICD-10-CM | POA: Insufficient documentation

## 2016-11-29 DIAGNOSIS — I771 Stricture of artery: Secondary | ICD-10-CM | POA: Insufficient documentation

## 2016-11-29 DIAGNOSIS — Z720 Tobacco use: Secondary | ICD-10-CM | POA: Insufficient documentation

## 2016-11-29 DIAGNOSIS — Z95820 Peripheral vascular angioplasty status with implants and grafts: Secondary | ICD-10-CM | POA: Insufficient documentation

## 2016-11-30 ENCOUNTER — Ambulatory Visit: Payer: Self-pay | Admitting: Family Medicine

## 2016-12-24 ENCOUNTER — Encounter: Payer: Self-pay | Admitting: Obstetrics and Gynecology

## 2016-12-25 ENCOUNTER — Ambulatory Visit: Payer: Self-pay | Attending: Family Medicine

## 2016-12-25 MED FILL — VENLAFAXINE HCL ER 75 MG CA: 75 | 30 days supply | Qty: 30 | Fill #1

## 2016-12-25 MED FILL — ?CARVEDILOL 3.125 MG TABLET: 3.125 | 30 days supply | Qty: 30 | Fill #1

## 2016-12-25 MED FILL — !VENTOLIN HFA INHALER: 108 (90 BAS | 25 days supply | Qty: 18 | Fill #1

## 2016-12-25 MED FILL — ?ATORVASTATIN 40MG TABLET: 40 | 30 days supply | Qty: 30 | Fill #1

## 2016-12-25 MED FILL — cloNIDine HCL 0.2 MG TABS: 0.2 | 30 days supply | Qty: 30 | Fill #1

## 2016-12-25 MED FILL — LISINOPRIL 2.5 MG TABLET: 2.5 | 30 days supply | Qty: 30 | Fill #1

## 2016-12-25 MED FILL — CLOPIDOGREL 75 MG TABLET: 75 | 30 days supply | Qty: 30 | Fill #1

## 2017-01-23 ENCOUNTER — Encounter: Payer: Self-pay | Admitting: Obstetrics and Gynecology

## 2017-01-25 ENCOUNTER — Ambulatory Visit: Payer: Self-pay | Attending: Family Medicine

## 2017-01-25 MED FILL — !VENTOLIN HFA INHALER: 108 (90 BAS | 25 days supply | Qty: 18 | Fill #2

## 2017-01-25 MED FILL — LISINOPRIL 2.5 MG TABLET: 2.5 | 30 days supply | Qty: 30 | Fill #2

## 2017-01-25 MED FILL — ?CARVEDILOL 3.125 MG TABLET: 3.125 | 30 days supply | Qty: 30 | Fill #2

## 2017-01-25 MED FILL — ?ATORVASTATIN 40MG TABLET: 40 | 30 days supply | Qty: 30 | Fill #2

## 2017-01-28 MED FILL — CLOPIDOGREL 75 MG TABLET: 75 | 30 days supply | Qty: 30 | Fill #2

## 2017-02-06 ENCOUNTER — Ambulatory Visit (INDEPENDENT_AMBULATORY_CARE_PROVIDER_SITE_OTHER): Payer: Self-pay | Admitting: Family Medicine

## 2017-02-06 ENCOUNTER — Encounter: Payer: Self-pay | Admitting: Family Medicine

## 2017-02-06 VITALS — BP 148/76 | HR 92 | Wt 174.1 lb

## 2017-02-06 DIAGNOSIS — Z1151 Encounter for screening for human papillomavirus (HPV): Secondary | ICD-10-CM

## 2017-02-06 DIAGNOSIS — Z01411 Encounter for gynecological examination (general) (routine) with abnormal findings: Secondary | ICD-10-CM

## 2017-02-06 DIAGNOSIS — N951 Menopausal and female climacteric states: Secondary | ICD-10-CM

## 2017-02-06 DIAGNOSIS — Z124 Encounter for screening for malignant neoplasm of cervix: Secondary | ICD-10-CM

## 2017-02-06 MED ORDER — GABAPENTIN 300 MG PO CAPS: 300.0000 mg | ORAL_CAPSULE | Freq: Every day | ORAL | 2 refills | Status: DC

## 2017-02-06 NOTE — Patient Instructions (Signed)
Menopause Menopause is the normal time of life when menstrual periods stop completely. Menopause is complete when you have missed 12 consecutive menstrual periods. It usually occurs between the ages of 32 years and 54 years. Very rarely does a woman develop menopause before the age of 40 years. At menopause, your ovaries stop producing the female hormones estrogen and progesterone. This can cause undesirable symptoms and also affect your health. Sometimes the symptoms may occur 4-5 years before the menopause begins. There is no relationship between menopause and:  Oral contraceptives.  Number of children you had.  Race.  The age your menstrual periods started (menarche).  Heavy smokers and very thin women may develop menopause earlier in life. What are the causes?  The ovaries stop producing the female hormones estrogen and progesterone. Other causes include:  Surgery to remove both ovaries.  The ovaries stop functioning for no known reason.  Tumors of the pituitary gland in the brain.  Medical disease that affects the ovaries and hormone production.  Radiation treatment to the abdomen or pelvis.  Chemotherapy that affects the ovaries.  What are the signs or symptoms?  Hot flashes.  Night sweats.  Decrease in sex drive.  Vaginal dryness and thinning of the vagina causing painful intercourse.  Dryness of the skin and developing wrinkles.  Headaches.  Tiredness.  Irritability.  Memory problems.  Weight gain.  Bladder infections.  Hair growth of the face and chest.  Infertility. More serious symptoms include:  Loss of bone (osteoporosis) causing breaks (fractures).  Depression.  Hardening and narrowing of the arteries (atherosclerosis) causing heart attacks and strokes.  How is this diagnosed?  When the menstrual periods have stopped for 12 straight months.  Physical exam.  Hormone studies of the blood. How is this treated? There are many treatment  choices and nearly as many questions about them. The decisions to treat or not to treat menopausal changes is an individual choice made with your health care provider. Your health care provider can discuss the treatments with you. Together, you can decide which treatment will work best for you. Your treatment choices may include:  Hormone therapy (estrogen and progesterone).  Non-hormonal medicines.  Treating the individual symptoms with medicine (for example antidepressants for depression).  Herbal medicines that may help specific symptoms.  Counseling by a psychiatrist or psychologist.  Group therapy.  Lifestyle changes including: ? Eating healthy. ? Regular exercise. ? Limiting caffeine and alcohol. ? Stress management and meditation.  No treatment.  Follow these instructions at home:  Take the medicine your health care provider gives you as directed.  Get plenty of sleep and rest.  Exercise regularly.  Eat a diet that contains calcium (good for the bones) and soy products (acts like estrogen hormone).  Avoid alcoholic beverages.  Do not smoke.  If you have hot flashes, dress in layers.  Take supplements, calcium, and vitamin D to strengthen bones.  You can use over-the-counter lubricants or moisturizers for vaginal dryness.  Group therapy is sometimes very helpful.  Acupuncture may be helpful in some cases. Contact a health care provider if:  You are not sure you are in menopause.  You are having menopausal symptoms and need advice and treatment.  You are still having menstrual periods after age 17 years.  You have pain with intercourse.  Menopause is complete (no menstrual period for 12 months) and you develop vaginal bleeding.  You need a referral to a specialist (gynecologist, psychiatrist, or psychologist) for treatment. Get help right  away if:  You have severe depression.  You have excessive vaginal bleeding.  You fell and think you have a  broken bone.  You have pain when you urinate.  You develop leg or chest pain.  You have a fast pounding heart beat (palpitations).  You have severe headaches.  You develop vision problems.  You feel a lump in your breast.  You have abdominal pain or severe indigestion. This information is not intended to replace advice given to you by your health care provider. Make sure you discuss any questions you have with your health care provider. Document Released: 06/16/2003 Document Revised: 09/01/2015 Document Reviewed: 10/23/2012 Elsevier Interactive Patient Education  2017 [place] Years, Female Preventive care refers to lifestyle choices and visits with your health care provider that can promote health and wellness. What does preventive care include?  A yearly physical exam. This is also called an annual well check.  Dental exams once or twice a year.  Routine eye exams. Ask your health care provider how often you should have your eyes checked.  Personal lifestyle choices, including: ? Daily care of your teeth and gums. ? Regular physical activity. ? Eating a healthy diet. ? Avoiding tobacco and drug use. ? Limiting alcohol use. ? Practicing safe sex. ? Taking low-dose aspirin daily starting at age 43. ? Taking vitamin and mineral supplements as recommended by your health care provider. What happens during an annual well check? The services and screenings done by your health care provider during your annual well check will depend on your age, overall health, lifestyle risk factors, and family history of disease. Counseling Your health care provider may ask you questions about your:  Alcohol use.  Tobacco use.  Drug use.  Emotional well-being.  Home and relationship well-being.  Sexual activity.  Eating habits.  Work and work Statistician.  Method of birth control.  Menstrual cycle.  Pregnancy history.  Screening You may have the  following tests or measurements:  Height, weight, and BMI.  Blood pressure.  Lipid and cholesterol levels. These may be checked every 5 years, or more frequently if you are over 77 years old.  Skin check.  Lung cancer screening. You may have this screening every year starting at age 47 if you have a 30-pack-year history of smoking and currently smoke or have quit within the past 15 years.  Fecal occult blood test (FOBT) of the stool. You may have this test every year starting at age 13.  Flexible sigmoidoscopy or colonoscopy. You may have a sigmoidoscopy every 5 years or a colonoscopy every 10 years starting at age 71.  Hepatitis C blood test.  Hepatitis B blood test.  Sexually transmitted disease (STD) testing.  Diabetes screening. This is done by checking your blood sugar (glucose) after you have not eaten for a while (fasting). You may have this done every 1-3 years.  Mammogram. This may be done every 1-2 years. Talk to your health care provider about when you should start having regular mammograms. This may depend on whether you have a family history of breast cancer.  BRCA-related cancer screening. This may be done if you have a family history of breast, ovarian, tubal, or peritoneal cancers.  Pelvic exam and Pap test. This may be done every 3 years starting at age 62. Starting at age 66, this may be done every 5 years if you have a Pap test in combination with an HPV test.  Bone density scan. This is  done to screen for osteoporosis. You may have this scan if you are at high risk for osteoporosis.  Discuss your test results, treatment options, and if necessary, the need for more tests with your health care provider. Vaccines Your health care provider may recommend certain vaccines, such as:  Influenza vaccine. This is recommended every year.  Tetanus, diphtheria, and acellular pertussis (Tdap, Td) vaccine. You may need a Td booster every 10 years.  Varicella vaccine. You  may need this if you have not been vaccinated.  Zoster vaccine. You may need this after age 30.  Measles, mumps, and rubella (MMR) vaccine. You may need at least one dose of MMR if you were born in 1957 or later. You may also need a second dose.  Pneumococcal 13-valent conjugate (PCV13) vaccine. You may need this if you have certain conditions and were not previously vaccinated.  Pneumococcal polysaccharide (PPSV23) vaccine. You may need one or two doses if you smoke cigarettes or if you have certain conditions.  Meningococcal vaccine. You may need this if you have certain conditions.  Hepatitis A vaccine. You may need this if you have certain conditions or if you travel or work in places where you may be exposed to hepatitis A.  Hepatitis B vaccine. You may need this if you have certain conditions or if you travel or work in places where you may be exposed to hepatitis B.  Haemophilus influenzae type b (Hib) vaccine. You may need this if you have certain conditions.  Talk to your health care provider about which screenings and vaccines you need and how often you need them. This information is not intended to replace advice given to you by your health care provider. Make sure you discuss any questions you have with your health care provider. Document Released: 04/22/2015 Document Revised: 12/14/2015 Document Reviewed: 01/25/2015 Elsevier Interactive Patient Education  2017 [employer].

## 2017-02-06 NOTE — Progress Notes (Signed)
   Subjective:    Patient ID: Robin Walsh is a 51 y.o. female presenting with No chief complaint on file.  on 02/06/2017  HPI: Referred for hot flashes. Needs pap. Has tried clonidine and effexor and no relief. Hot flashes began this past year. She notes they come 20+ x/day. Night sweats 4x/day. Last period x 1 year ago. Cardiac cath with stent placement 1 year ago and with PVD and stenting in LE as well. Still smoking. Had significant dry mouth with Effexor. No longer taking either of these. Interested in Gabapentin.  Review of Systems  Constitutional: Negative for chills and fever.  Respiratory: Negative for shortness of breath.   Cardiovascular: Negative for chest pain.  Gastrointestinal: Negative for abdominal pain, nausea and vomiting.  Endocrine: Positive for heat intolerance.  Genitourinary: Negative for dysuria, pelvic pain and vaginal bleeding.  Skin: Negative for rash.  Psychiatric/Behavioral: Positive for agitation, decreased concentration and dysphoric mood.      Objective:    BP (!) 148/76   Pulse 92   Wt 174 lb 1.6 oz (79 kg)   LMP 03/21/2016 (Within Weeks)   BMI 29.42 kg/m  Physical Exam  Constitutional: She is oriented to person, place, and time. She appears well-developed and well-nourished. No distress.  HENT:  Head: Normocephalic and atraumatic.  Eyes: No scleral icterus.  Neck: Neck supple.  Cardiovascular: Normal rate.   Pulmonary/Chest: Effort normal.  Abdominal: Soft.  Genitourinary:  Genitourinary Comments: BUS normal, vagina is pale and rugated, cervix is nulliparous without lesion, uterus is small and anteverted, no adnexal mass or tenderness.   Neurological: She is alert and oriented to person, place, and time.  Skin: Skin is warm and dry.  Psychiatric: She has a normal mood and affect.       Assessment & Plan:   Problem List Items Addressed This Visit      Unprioritized   Hot flash, menopausal    Given severity would benefit strongly  from HRT--though she is not a candidate due to h/o CAD and PVD!! She would take any improvement at this point. Review of UpToDate reveals possible decrease in symptoms by 47% with use of Neurontin. Will attempt trial of Gabapentin. Side effects discussed at length. May need to increase to 300 mg tid if this helps.      Relevant Medications   gabapentin (NEURONTIN) 300 MG capsule    Other Visit Diagnoses    Encntr for gyn exam (general) (routine) w abnormal findings    -  Primary   Relevant Orders   MM DIGITAL SCREENING BILATERAL   Screening for cervical cancer       Relevant Orders   Cytology - PAP      Total face-to-face time with patient: 30 minutes. Over 50% of encounter was spent on counseling and coordination of care.  Donnamae Jude 02/06/2017 3:28 PM

## 2017-02-07 ENCOUNTER — Encounter: Payer: Self-pay | Admitting: Family Medicine

## 2017-02-07 NOTE — Assessment & Plan Note (Addendum)
Given severity would benefit strongly from HRT--though she is not a candidate due to h/o CAD and PVD!! She would take any improvement at this point. Review of UpToDate reveals possible decrease in symptoms by 47% with use of Neurontin. Will attempt trial of Gabapentin. Side effects discussed at length. May need to increase to 300 mg tid if this helps.

## 2017-02-08 LAB — CYTOLOGY - PAP
Adequacy: ABSENT
Diagnosis: NEGATIVE
HPV: NOT DETECTED

## 2017-02-08 MED FILL — GABAPENTIN 300 MG CAPSULE: 300 | 30 days supply | Qty: 30 | Fill #0

## 2017-02-11 ENCOUNTER — Telehealth: Payer: Self-pay | Admitting: Cardiovascular Disease

## 2017-02-11 NOTE — Telephone Encounter (Signed)
Called patient and LVM to call back and schedule appointment.

## 2017-02-26 ENCOUNTER — Encounter: Payer: Self-pay | Admitting: Family Medicine

## 2017-02-26 ENCOUNTER — Ambulatory Visit: Payer: Self-pay | Attending: Family Medicine | Admitting: Family Medicine

## 2017-02-26 VITALS — BP 138/69 | HR 86 | Temp 97.6°F | Ht 64.0 in | Wt 173.4 lb

## 2017-02-26 DIAGNOSIS — Z7982 Long term (current) use of aspirin: Secondary | ICD-10-CM | POA: Insufficient documentation

## 2017-02-26 DIAGNOSIS — Z9861 Coronary angioplasty status: Secondary | ICD-10-CM

## 2017-02-26 DIAGNOSIS — Z9889 Other specified postprocedural states: Secondary | ICD-10-CM | POA: Insufficient documentation

## 2017-02-26 DIAGNOSIS — Z7902 Long term (current) use of antithrombotics/antiplatelets: Secondary | ICD-10-CM | POA: Insufficient documentation

## 2017-02-26 DIAGNOSIS — I10 Essential (primary) hypertension: Secondary | ICD-10-CM | POA: Insufficient documentation

## 2017-02-26 DIAGNOSIS — J40 Bronchitis, not specified as acute or chronic: Secondary | ICD-10-CM | POA: Insufficient documentation

## 2017-02-26 DIAGNOSIS — E785 Hyperlipidemia, unspecified: Secondary | ICD-10-CM | POA: Insufficient documentation

## 2017-02-26 DIAGNOSIS — I2510 Atherosclerotic heart disease of native coronary artery without angina pectoris: Secondary | ICD-10-CM | POA: Insufficient documentation

## 2017-02-26 DIAGNOSIS — Z955 Presence of coronary angioplasty implant and graft: Secondary | ICD-10-CM | POA: Insufficient documentation

## 2017-02-26 DIAGNOSIS — Z87891 Personal history of nicotine dependence: Secondary | ICD-10-CM | POA: Insufficient documentation

## 2017-02-26 DIAGNOSIS — F17200 Nicotine dependence, unspecified, uncomplicated: Secondary | ICD-10-CM

## 2017-02-26 DIAGNOSIS — Z885 Allergy status to narcotic agent status: Secondary | ICD-10-CM | POA: Insufficient documentation

## 2017-02-26 DIAGNOSIS — Z79899 Other long term (current) drug therapy: Secondary | ICD-10-CM | POA: Insufficient documentation

## 2017-02-26 DIAGNOSIS — I739 Peripheral vascular disease, unspecified: Secondary | ICD-10-CM | POA: Insufficient documentation

## 2017-02-26 DIAGNOSIS — Z95820 Peripheral vascular angioplasty status with implants and grafts: Secondary | ICD-10-CM | POA: Insufficient documentation

## 2017-02-26 DIAGNOSIS — N951 Menopausal and female climacteric states: Secondary | ICD-10-CM

## 2017-02-26 DIAGNOSIS — H9312 Tinnitus, left ear: Secondary | ICD-10-CM | POA: Insufficient documentation

## 2017-02-26 MED ORDER — GABAPENTIN 300 MG PO CAPS: 600.0000 mg | ORAL_CAPSULE | Freq: Every day | ORAL | 3 refills | Status: DC

## 2017-02-26 MED ORDER — ATORVASTATIN CALCIUM 40 MG PO TABS: 40.0000 mg | ORAL_TABLET | Freq: Every day | ORAL | 3 refills | Status: DC

## 2017-02-26 MED ORDER — CARVEDILOL 3.125 MG PO TABS
3.1250 mg | ORAL_TABLET | Freq: Every day | ORAL | 3 refills | Status: DC
Start: 2017-02-26 — End: 2017-09-25

## 2017-02-26 MED ORDER — CLOPIDOGREL BISULFATE 75 MG PO TABS: 75.0000 mg | ORAL_TABLET | Freq: Every day | ORAL | 3 refills | Status: DC

## 2017-02-26 MED ORDER — ASPIRIN 81 MG PO CHEW: 81.0000 mg | CHEWABLE_TABLET | Freq: Every day | ORAL | 11 refills | Status: AC

## 2017-02-26 MED ORDER — LISINOPRIL 5 MG PO TABS: 5.0000 mg | ORAL_TABLET | Freq: Every day | ORAL | 5 refills | Status: DC

## 2017-02-26 MED ORDER — VARENICLINE TARTRATE 0.5 MG X 11 & 1 MG X 42 PO MISC: ORAL | 0 refills | Status: DC

## 2017-02-26 MED ORDER — ALBUTEROL SULFATE HFA 108 (90 BASE) MCG/ACT IN AERS: 2.0000 | INHALATION_SPRAY | Freq: Four times a day (QID) | RESPIRATORY_TRACT | 3 refills | Status: DC | PRN

## 2017-02-26 MED ORDER — VARENICLINE TARTRATE 1 MG PO TABS: 1.0000 mg | ORAL_TABLET | Freq: Two times a day (BID) | ORAL | 1 refills | Status: DC

## 2017-02-26 MED FILL — ?CLOPIDOGREL 75MG TAB: 75 | 30 days supply | Qty: 30 | Fill #0

## 2017-02-26 MED FILL — GABAPENTIN 300 MG CAPSULE: 300 | 15 days supply | Qty: 30 | Fill #0

## 2017-02-26 MED FILL — ?CARVEDILOL 3.125 MG TABLET: 3.125 | 30 days supply | Qty: 30 | Fill #0

## 2017-02-26 MED FILL — ?ATORVASTATIN 40MG TABLET: 40 | 30 days supply | Qty: 30 | Fill #0

## 2017-02-26 MED FILL — !VENTOLIN HFA INHALER: 108 (90 BAS | 25 days supply | Qty: 18 | Fill #0

## 2017-02-26 MED FILL — ?LISINOPRIL 5 MG TABLET: 5 | 30 days supply | Qty: 30 | Fill #0

## 2017-02-26 NOTE — Progress Notes (Signed)
Subjective:  Patient ID: Robin Walsh, female    DOB: [birth year]  Age: 51 y.o. MRN: [id number]  CC: Hypertension   HPI Robin Walsh is a 51 year old female with a history of tobacco abuse, coronary artery disease (status post PCI and drug-eluting stent placement to LAD in 11/2015) peripheral arterial disease (status post stenting of bilateral common iliac artery) who presents today for follow-up visit.  She was seen by GYN last month due to complaints of hot flashes and deemed not to be a candidate for HRT due to history of CAD and PVD.  She was placed on gabapentin and reports some improvement in symptoms however she is requesting an increase in the gabapentin dose for better control.  She has intermittent claudication while walking; ABI from 11/2016 was normal bilaterally.  She has an upcoming appointment with vascular. Denies chest pains, shortness of breath.  She is requesting a prescription for Chantix to help her quit smoking as she currently smokes 1/2 to 1 pack of cigarettes per day and has smoked for the last 15 years.  She complains of tinnitus in the left ear more pronounced when she lies on the left side but denies hearing loss, postnasal drip.   Past Medical History:  Diagnosis Date  . CAD (coronary artery disease)   . Hyperlipidemia   . Hypertension     Past Surgical History:  Procedure Laterality Date  . CARDIAC CATHETERIZATION N/A 11/28/2015   Procedure: Left Heart Cath and Coronary Angiography;  Surgeon: Jettie Booze, MD;  Location: [facility];  Service: Cardiovascular;  Laterality: N/A;  . CARDIAC CATHETERIZATION N/A 11/28/2015   Procedure: Coronary Stent Intervention;  Surgeon: Jettie Booze, MD;  Location: [facility];  Service: Cardiovascular;  Laterality: N/A;  DES to Mid LAD; 3.0x12 Synergy  . LEG SURGERY  2002?   tibial plateau fracture  . PERIPHERAL VASCULAR CATHETERIZATION N/A 01/04/2016   Procedure: Abdominal Aortogram;   Surgeon: Nelva Bush, MD;  Location: [facility];  Service: Cardiovascular;  Laterality: N/A;  . PERIPHERAL VASCULAR CATHETERIZATION Bilateral 01/04/2016   Procedure: Lower Extremity Angiography;  Surgeon: Nelva Bush, MD;  Location: [facility];  Service: Cardiovascular;  Laterality: Bilateral;  . PERIPHERAL VASCULAR CATHETERIZATION Bilateral 01/04/2016   Procedure: Peripheral Vascular Intervention;  Surgeon: Nelva Bush, MD;  Location: [place] [facility];  Service: Cardiovascular;  Laterality: Bilateral;  common iliac    Allergies  Allergen Reactions  . Codeine Nausea And Vomiting  . Dilaudid [Hydromorphone Hcl] Nausea And Vomiting     Outpatient Medications Prior to Visit  Medication Sig Dispense Refill  . nitroGLYCERIN (NITROSTAT) 0.4 MG SL tablet Place 0.4 mg under the tongue every 5 (five) minutes as needed for chest pain. X 3 doses    . albuterol (PROVENTIL HFA;VENTOLIN HFA) 108 (90 Base) MCG/ACT inhaler Inhale 2 puffs into the lungs every 6 (six) hours as needed for wheezing or shortness of breath. 1 Inhaler 3  . aspirin 81 MG chewable tablet Chew 1 tablet (81 mg total) by mouth daily. 30 tablet 11  . atorvastatin (LIPITOR) 40 MG tablet Take 1 tablet (40 mg total) by mouth daily at 6 PM. 30 tablet 3  . carvedilol (COREG) 3.125 MG tablet Take 1 tablet (3.125 mg total) by mouth daily. 60 tablet 3  . clopidogrel (PLAVIX) 75 MG tablet Take 1 tablet (75 mg total) by mouth daily with breakfast. 30 tablet 3  . gabapentin (NEURONTIN) 300 MG capsule Take  1 capsule (300 mg total) by mouth at bedtime. 30 capsule 2  . lisinopril (PRINIVIL,ZESTRIL) 2.5 MG tablet Take 1 tablet (2.5 mg total) by mouth daily. 30 tablet 3   No facility-administered medications prior to visit.     ROS Review of Systems  Constitutional: Negative for activity change, appetite change and fatigue.  HENT: Negative for congestion, sinus pressure and sore throat.   Eyes: Negative for visual  disturbance.  Respiratory: Negative for cough, chest tightness, shortness of breath and wheezing.   Cardiovascular: Negative for chest pain and palpitations.  Gastrointestinal: Negative for abdominal distention, abdominal pain and constipation.  Endocrine: Negative for polydipsia.  Genitourinary: Negative for dysuria and frequency.  Musculoskeletal: Negative for arthralgias and back pain.  Skin: Negative for rash.  Neurological: Negative for tremors, light-headedness and numbness.  Hematological: Does not bruise/bleed easily.  Psychiatric/Behavioral: Negative for agitation and behavioral problems.    Objective:  BP 138/69   Pulse 86   Temp 97.6 F (36.4 C) (Oral)   Ht 5\' 4"  (1.626 m)   Wt 173 lb 6.4 oz (78.7 kg)   SpO2 98%   BMI 29.76 kg/m   BP/Weight 02/26/2017 02/06/2017 04/14/2692  Systolic BP 854 627 035  Diastolic BP 69 76 75  Wt. (Lbs) 173.4 174.1 166.4  BMI 29.76 29.42 28.12      Physical Exam  Constitutional: She is oriented to person, place, and time. She appears well-developed and well-nourished.  Cardiovascular: Normal rate, normal heart sounds and intact distal pulses.  No murmur heard. Pulmonary/Chest: Effort normal and breath sounds normal. She has no wheezes. She has no rales. She exhibits no tenderness.  Abdominal: Soft. Bowel sounds are normal. She exhibits no distension and no mass. There is no tenderness.  Musculoskeletal: Normal range of motion.  Neurological: She is alert and oriented to person, place, and time.  Skin: Skin is warm and dry.  Psychiatric: She has a normal mood and affect.    CMP Latest Ref Rng & Units 09/07/2016 01/04/2016 11/29/2015  Glucose 65 - 99 mg/dL 94 112(H) 100(H)  BUN 6 - 24 mg/dL 11 9 8   Creatinine 0.57 - 1.00 mg/dL 0.99 1.06(H) 0.94  Sodium 134 - 144 mmol/L 141 138 136  Potassium 3.5 - 5.2 mmol/L 4.9 3.9 3.9  Chloride 96 - 106 mmol/L 103 105 107  CO2 18 - 29 mmol/L 23 23 24   Calcium 8.7 - 10.2 mg/dL 9.9 9.7 8.8(L)    Total Protein 6.0 - 8.5 g/dL 7.4 - 5.9(L)  Total Bilirubin 0.0 - 1.2 mg/dL <0.2 - 0.6  Alkaline Phos 39 - 117 IU/L 63 - 45  AST 0 - 40 IU/L 12 - 15  ALT 0 - 32 IU/L 10 - 11(L)    Lipid Panel     Component Value Date/Time   CHOL 177 09/07/2016 0854   TRIG 312 (H) 09/07/2016 0854   HDL 31 (L) 09/07/2016 0854   CHOLHDL 5.7 (H) 09/07/2016 0854   CHOLHDL 7.8 11/27/2015 0453   VLDL 66 (H) 11/27/2015 0453   LDLCALC 84 09/07/2016 0854     Assessment & Plan:   1. CAD S/P DES PCI TO mLAD - Synergy DES 3.0 mm x 12 mm Asymptomatic Risk factor modification including tobacco cessation Advised to add fish all capsules due to elevated triglycerides Consider increasing Lipitor dose at next visit if lipid panel is still elevated; LDL is 84 and her goal is less than 70 - atorvastatin (LIPITOR) 40 MG tablet; Take 1 tablet (40  mg total) by mouth daily at 6 PM.  Dispense: 30 tablet; Refill: 3 - aspirin 81 MG chewable tablet; Chew 1 tablet (81 mg total) by mouth daily.  Dispense: 30 tablet; Refill: 11 - clopidogrel (PLAVIX) 75 MG tablet; Take 1 tablet (75 mg total) by mouth daily with breakfast.  Dispense: 30 tablet; Refill: 3  2. Bronchitis Controlled - albuterol (PROVENTIL HFA;VENTOLIN HFA) 108 (90 Base) MCG/ACT inhaler; Inhale 2 puffs into the lungs every 6 (six) hours as needed for wheezing or shortness of breath.  Dispense: 1 Inhaler; Refill: 3  3. Essential hypertension Controlled Low-sodium diet Increase dose of lisinopril due to complains of tinnitus  to obtain blood pressure goal of less than 130/80 - carvedilol (COREG) 3.125 MG tablet; Take 1 tablet (3.125 mg total) by mouth daily.  Dispense: 60 tablet; Refill: 3 - lisinopril (PRINIVIL,ZESTRIL) 5 MG tablet; Take 1 tablet (5 mg total) by mouth daily.  Dispense: 30 tablet; Refill: 5 - Basic Metabolic Panel; Future  4. Hot flash, menopausal Improved but still symptomatic Increase dose as per patient request - gabapentin (NEURONTIN)  300 MG capsule; Take 2 capsules (600 mg total) by mouth at bedtime.  Dispense: 30 capsule; Refill: 3  5. Tobacco dependence Unable to tolerate Wellbutrin and other nicotine replacement therapies She is requesting Chantix; I have reviewed the literature regarding Chantix in patients with CAD and conclusion is  no major adverse cardiovascular effects in stable CV disease - varenicline (CHANTIX STARTING MONTH PAK) 0.5 MG X 11 & 1 MG X 42 tablet; Take one 0.5 mg tablet by mouth once daily for 3 days, then increase to one 0.5 mg tablet twice daily for 4 days, then increase to one 1 mg tablet twice daily.  Dispense: 53 tablet; Refill: 0 - varenicline (CHANTIX CONTINUING MONTH PAK) 1 MG tablet; Take 1 tablet (1 mg total) by mouth 2 (two) times daily.  Dispense: 60 tablet; Refill: 1  6. Tinnitus of left ear Optimal blood pressure control Advised to use ear buds to decrease  tinnitus  7.  PAD Continues to have intermittent claudication Continue statin Risk factor modification-especially smoking cessation Follow-up with vascular  Meds ordered this encounter  Medications  . atorvastatin (LIPITOR) 40 MG tablet    Sig: Take 1 tablet (40 mg total) by mouth daily at 6 PM.    Dispense:  30 tablet    Refill:  3  . albuterol (PROVENTIL HFA;VENTOLIN HFA) 108 (90 Base) MCG/ACT inhaler    Sig: Inhale 2 puffs into the lungs every 6 (six) hours as needed for wheezing or shortness of breath.    Dispense:  1 Inhaler    Refill:  3  . aspirin 81 MG chewable tablet    Sig: Chew 1 tablet (81 mg total) by mouth daily.    Dispense:  30 tablet    Refill:  11  . carvedilol (COREG) 3.125 MG tablet    Sig: Take 1 tablet (3.125 mg total) by mouth daily.    Dispense:  60 tablet    Refill:  3  . clopidogrel (PLAVIX) 75 MG tablet    Sig: Take 1 tablet (75 mg total) by mouth daily with breakfast.    Dispense:  30 tablet    Refill:  3  . lisinopril (PRINIVIL,ZESTRIL) 5 MG tablet    Sig: Take 1 tablet (5 mg total) by  mouth daily.    Dispense:  30 tablet    Refill:  5    Discontinue previous dose  .  gabapentin (NEURONTIN) 300 MG capsule    Sig: Take 2 capsules (600 mg total) by mouth at bedtime.    Dispense:  30 capsule    Refill:  3  . varenicline (CHANTIX STARTING MONTH PAK) 0.5 MG X 11 & 1 MG X 42 tablet    Sig: Take one 0.5 mg tablet by mouth once daily for 3 days, then increase to one 0.5 mg tablet twice daily for 4 days, then increase to one 1 mg tablet twice daily.    Dispense:  53 tablet    Refill:  0  . varenicline (CHANTIX CONTINUING MONTH PAK) 1 MG tablet    Sig: Take 1 tablet (1 mg total) by mouth 2 (two) times daily.    Dispense:  60 tablet    Refill:  1    Dispense after completion of starter pack    Follow-up: Return in about 3 months (around 05/29/2017) for follow up of chronic medical conditions.   Arnoldo Morale MD

## 2017-02-26 NOTE — Patient Instructions (Signed)
Tinnitus Tinnitus refers to hearing a sound when there is no actual source for that sound. This is often described as ringing in the ears. However, people with this condition may hear a variety of noises. A person may hear the sound in one ear or in both ears. The sounds of tinnitus can be soft, loud, or somewhere in between. Tinnitus can last for a few seconds or can be constant for days. It may go away without treatment and come back at various times. When tinnitus is constant or happens often, it can lead to other problems, such as trouble sleeping and trouble concentrating. Almost everyone experiences tinnitus at some point. Tinnitus that is long-lasting (chronic) or comes back often is a problem that may require medical attention. What are the causes? The cause of tinnitus is often not known. In some cases, it can result from other problems or conditions, including:  Exposure to loud noises from machinery, music, or other sources.  Hearing loss.  Ear or sinus infections.  Earwax buildup.  A foreign object in the ear.  Use of certain medicines.  Use of alcohol and caffeine.  High blood pressure.  Heart diseases.  Anemia.  Allergies.  Meniere disease.  Thyroid problems.  Tumors.  An enlarged part of a weakened blood vessel (aneurysm).  What are the signs or symptoms? The main symptom of tinnitus is hearing a sound when there is no source for that sound. It may sound like:  Buzzing.  Roaring.  Ringing.  Blowing air, similar to the sound heard when you listen to a seashell.  Hissing.  Whistling.  Sizzling.  Humming.  Running water.  A sustained musical note.  How is this diagnosed? Tinnitus is diagnosed based on your symptoms. Your health care provider will do a physical exam. A comprehensive hearing exam (audiologic exam) will be done if your tinnitus:  Affects only one ear (unilateral).  Causes hearing difficulties.  Lasts 6 months or  longer.  You may also need to see a health care provider who specializes in hearing disorders (audiologist). You may be asked to complete a questionnaire to determine the severity of your tinnitus. Tests may be done to help determine the cause and to rule out other conditions. These can include:  Imaging studies of your head and brain, such as: ? A CT scan. ? An MRI.  An imaging study of your blood vessels (angiogram).  How is this treated? Treating an underlying medical condition can sometimes make tinnitus go away. If your tinnitus continues, other treatments may include:  Medicines, such as certain antidepressants or sleeping aids.  Sound generators to mask the tinnitus. These include: ? Tabletop sound machines that play relaxing sounds to help you fall asleep. ? Wearable devices that fit in your ear and play sounds or music. ? A small device that uses headphones to deliver a signal embedded in music (acoustic neural stimulation). In time, this may change the pathways of your brain and make you less sensitive to tinnitus. This device is used for very severe cases when no other treatment is working.  Therapy and counseling to help you manage the stress of living with tinnitus.  Using hearing aids or cochlear implants, if your tinnitus is related to hearing loss.  Follow these instructions at home:  When possible, avoid being in loud places and being exposed to loud sounds.  Wear hearing protection, such as earplugs, when you are exposed to loud noises.  Do not take stimulants, such as nicotine,   alcohol, or caffeine.  Practice techniques for reducing stress, such as meditation, yoga, or deep breathing.  Use a white noise machine, a humidifier, or other devices to mask the sound of tinnitus.  Sleep with your head slightly raised. This may reduce the impact of tinnitus.  Try to get plenty of rest each night. Contact a health care provider if:  You have tinnitus in just one  ear.  Your tinnitus continues for 3 weeks or longer without stopping.  Home care measures are not helping.  You have tinnitus after a head injury.  You have tinnitus along with any of the following: ? Dizziness. ? Loss of balance. ? Nausea and vomiting. This information is not intended to replace advice given to you by your health care provider. Make sure you discuss any questions you have with your health care provider. Document Released: 03/26/2005 Document Revised: 11/27/2015 Document Reviewed: 08/26/2013 Elsevier Interactive Patient Education  2018 [employer]  

## 2017-02-27 ENCOUNTER — Encounter: Payer: Self-pay | Admitting: Family Medicine

## 2017-02-27 MED ORDER — GABAPENTIN 300 MG PO CAPS: 600.0000 mg | ORAL_CAPSULE | Freq: Every day | ORAL | 3 refills | Status: DC

## 2017-03-12 ENCOUNTER — Ambulatory Visit: Payer: Self-pay | Attending: Family Medicine

## 2017-03-12 ENCOUNTER — Ambulatory Visit (INDEPENDENT_AMBULATORY_CARE_PROVIDER_SITE_OTHER): Payer: Self-pay | Admitting: Cardiovascular Disease

## 2017-03-12 ENCOUNTER — Encounter: Payer: Self-pay | Admitting: Cardiovascular Disease

## 2017-03-12 VITALS — BP 116/60 | HR 85 | Ht 64.0 in | Wt 173.0 lb

## 2017-03-12 DIAGNOSIS — I739 Peripheral vascular disease, unspecified: Secondary | ICD-10-CM

## 2017-03-12 DIAGNOSIS — E785 Hyperlipidemia, unspecified: Secondary | ICD-10-CM

## 2017-03-12 DIAGNOSIS — Z720 Tobacco use: Secondary | ICD-10-CM

## 2017-03-12 DIAGNOSIS — I10 Essential (primary) hypertension: Secondary | ICD-10-CM | POA: Insufficient documentation

## 2017-03-12 DIAGNOSIS — I2510 Atherosclerotic heart disease of native coronary artery without angina pectoris: Secondary | ICD-10-CM

## 2017-03-12 NOTE — Progress Notes (Signed)
Cardiology Office Note   Date:  03/12/2017   ID:  Robin Walsh, DOB 1965/12/28, MRN [id number]  PCP:  Robin Morale, MD  Cardiologist:  Dr. Marlou Walsh  Chief Complaint  Patient presents with  . Follow-up    pt denied chest pain and SOB, pt c/o pain in legs, she stated she can't walk a far distance without pain      History of Present Illness: Robin Walsh is a 51 y.o. female who presents for a follow-up visit regarding  peripheral arterial disease. She has known history of coronary artery disease, hypertension, hyperlipidemia and tobacco use. She has known history of coronary artery disease status post LAD PCI in 2017. She is status post bilateral common iliac artery kissing stent placement in 12/2015 for severe claudication. She had no significant infrainguinal disease. She had recurrent right leg claudication a few months ago which has been worsening.  Vascular studies showed normal ABI.  However, duplex showed significant in-stent restenosis in bilateral iliac arteries. There has been worsening of right leg claudication which is happening now after going up 10 steps.  She can walk 1 block before having to stop and rest for a few minutes.  No chest pain or shortness of breath. Unfortunately, she continues to smoke half a pack to 1 pack daily.   Past Medical History:  Diagnosis Date  . CAD (coronary artery disease)   . Hyperlipidemia   . Hypertension     Past Surgical History:  Procedure Laterality Date  . CARDIAC CATHETERIZATION N/A 11/28/2015   Procedure: Left Heart Cath and Coronary Angiography;  Surgeon: Robin Booze, MD;  Location: [facility];  Service: Cardiovascular;  Laterality: N/A;  . CARDIAC CATHETERIZATION N/A 11/28/2015   Procedure: Coronary Stent Intervention;  Surgeon: Robin Booze, MD;  Location: [facility];  Service: Cardiovascular;  Laterality: N/A;  DES to Mid LAD; 3.0x12 Synergy  . LEG SURGERY  2002?   tibial plateau  fracture  . PERIPHERAL VASCULAR CATHETERIZATION N/A 01/04/2016   Procedure: Abdominal Aortogram;  Surgeon: Robin Bush, MD;  Location: [facility];  Service: Cardiovascular;  Laterality: N/A;  . PERIPHERAL VASCULAR CATHETERIZATION Bilateral 01/04/2016   Procedure: Lower Extremity Angiography;  Surgeon: Robin Bush, MD;  Location: [facility];  Service: Cardiovascular;  Laterality: Bilateral;  . PERIPHERAL VASCULAR CATHETERIZATION Bilateral 01/04/2016   Procedure: Peripheral Vascular Intervention;  Surgeon: Robin Bush, MD;  Location: [facility] [facility];  Service: Cardiovascular;  Laterality: Bilateral;  common iliac     Current Outpatient Medications  Medication Sig Dispense Refill  . albuterol (PROVENTIL HFA;VENTOLIN HFA) 108 (90 Base) MCG/ACT inhaler Inhale 2 puffs into the lungs every 6 (six) hours as needed for wheezing or shortness of breath. 1 Inhaler 3  . aspirin 81 MG chewable tablet Chew 1 tablet (81 mg total) by mouth daily. 30 tablet 11  . atorvastatin (LIPITOR) 40 MG tablet Take 1 tablet (40 mg total) by mouth daily at 6 PM. 30 tablet 3  . carvedilol (COREG) 3.125 MG tablet Take 1 tablet (3.125 mg total) by mouth daily. 60 tablet 3  . clopidogrel (PLAVIX) 75 MG tablet Take 1 tablet (75 mg total) by mouth daily with breakfast. 30 tablet 3  . gabapentin (NEURONTIN) 300 MG capsule Take 2 capsules (600 mg total) by mouth at bedtime. 60 capsule 3  . lisinopril (PRINIVIL,ZESTRIL) 5 MG tablet Take 1 tablet (5 mg total) by mouth daily. 30 tablet 5  .  nitroGLYCERIN (NITROSTAT) 0.4 MG SL tablet Place 0.4 mg under the tongue every 5 (five) minutes as needed for chest pain. X 3 doses    . varenicline (CHANTIX CONTINUING MONTH PAK) 1 MG tablet Take 1 tablet (1 mg total) by mouth 2 (two) times daily. 60 tablet 1  . varenicline (CHANTIX STARTING MONTH PAK) 0.5 MG X 11 & 1 MG X 42 tablet Take one 0.5 mg tablet by mouth once daily for 3 days, then increase to one 0.5 mg tablet  twice daily for 4 days, then increase to one 1 mg tablet twice daily. 53 tablet 0   No current facility-administered medications for this visit.     Allergies:   Codeine and Dilaudid [hydromorphone hcl]    Social History:  The patient  reports that she has been smoking cigarettes.  She has a 12.50 pack-year smoking history. she has never used smokeless tobacco. She reports that she uses drugs. Drug: Marijuana. She reports that she does not drink alcohol.   Family History:  The patient's family history includes Alcohol abuse (age of onset: 35) in her father; Congestive Heart Failure (age of onset: 25) in her mother; Heart disease in her brother; Liver disease in her sister.    ROS:  Please see the history of present illness.   Otherwise, review of systems are positive for none.   All other systems are reviewed and negative.    PHYSICAL EXAM: VS:  BP 116/60   Pulse 85   Ht 5\' 4"  (1.626 m)   Wt 173 lb (78.5 kg)   BMI 29.70 kg/m  , BMI Body mass index is 29.7 kg/m. GEN: Well nourished, well developed, in no acute distress  HEENT: normal  Neck: no JVD, carotid bruits, or masses Cardiac: RRR; no murmurs, rubs, or gallops,no edema  Respiratory:  clear to auscultation bilaterally, normal work of breathing GI: soft, nontender, nondistended, + BS MS: no deformity or atrophy  Skin: warm and dry, no rash Neuro:  Strength and sensation are intact Psych: euthymic mood, full affect Vascular: Femoral pulses +1 bilaterally.  Distal pulses are +1 on the right side and +2 on the left side  EKG:  EKG is  ordered today. EKG showed normal sinus rhythm with no significant ST or T wave changes.  Recent Labs: 09/07/2016: ALT 10; BUN 11; Creatinine, Ser 0.99; Potassium 4.9; Sodium 141    Lipid Panel    Component Value Date/Time   CHOL 177 09/07/2016 0854   TRIG 312 (H) 09/07/2016 0854   HDL 31 (L) 09/07/2016 0854   CHOLHDL 5.7 (H) 09/07/2016 0854   CHOLHDL 7.8 11/27/2015 0453   VLDL 66 (H)  11/27/2015 0453   LDLCALC 84 09/07/2016 0854      Wt Readings from Last 3 Encounters:  03/12/17 173 lb (78.5 kg)  02/26/17 173 lb 6.4 oz (78.7 kg)  02/06/17 174 lb 1.6 oz (79 kg)       No flowsheet data found.    ASSESSMENT AND PLAN:  1.  Peripheral arterial disease: Recurrent severe right leg claudication due to in-stent restenosis in the common iliac arteries.   Given the severity of her symptoms, the patient would likely require a repeat revascularization procedure.  However, there is high chance for restenosis with continued smoking and I strongly advised her to quit smoking before we consider further revascularization. I also discussed with her the importance of a walking program. I agreed to give her a temporary handicap sticker for 6 months given  her limitations.  2. Tobacco use: I again discussed with her the importance of smoking cessation in order to prevent PAD progression.  3. Coronary artery disease involving native coronary arteries with other forms of angina:   Continue medical therapy.  4. Hyperlipidemia: Continue treatment with atorvastatin with a target LDL of less than 70.  Disposition:   FU with me in 3 months  Signed,  [name], MD  03/12/2017 11:09 AM    [place]

## 2017-03-12 NOTE — Patient Instructions (Signed)
Medication Instructions:  Your physician recommends that you continue on your current medications as directed. Please refer to the Current Medication list given to you today.   Labwork: none  Testing/Procedures: none  Follow-Up: Your physician recommends that you schedule a follow-up appointment in: 3 months with Dr. Fletcher Anon   Any Other Special Instructions Will Be Listed Below (If Applicable).  You were given the signed application for a handicapped sticker for 6 months.    If you need a refill on your cardiac medications before your next appointment, please call your pharmacy.

## 2017-03-12 NOTE — Progress Notes (Signed)
Patient here for lab visit  

## 2017-03-13 LAB — BASIC METABOLIC PANEL
BUN/Creatinine Ratio: 6 — ABNORMAL LOW (ref 9–23)
BUN: 7 mg/dL (ref 6–24)
CALCIUM: 9.2 mg/dL (ref 8.7–10.2)
CHLORIDE: 104 mmol/L (ref 96–106)
CO2: 23 mmol/L (ref 20–29)
CREATININE: 1.11 mg/dL — AB (ref 0.57–1.00)
GFR calc Af Amer: 67 mL/min/{1.73_m2} (ref >59)
GFR calc non Af Amer: 58 mL/min/{1.73_m2} — ABNORMAL LOW (ref >59)
GLUCOSE: 105 mg/dL — AB (ref 65–99)
Potassium: 4.6 mmol/L (ref 3.5–5.2)
Sodium: 139 mmol/L (ref 134–144)

## 2017-03-27 MED FILL — !VENTOLIN HFA INHALER: 108 (90 BAS | 25 days supply | Qty: 18 | Fill #1

## 2017-03-27 MED FILL — ?LISINOPRIL 5 MG TABLET: 5 | 30 days supply | Qty: 30 | Fill #1

## 2017-03-27 MED FILL — $CHANTIX 1 MG CONT MONTH BO: 1 | 60 days supply | Qty: 112 | Fill #0

## 2017-03-27 MED FILL — ?ATORVASTATIN 40MG TABLET: 40 | 30 days supply | Qty: 30 | Fill #1

## 2017-03-27 MED FILL — ?CARVEDILOL 3.125 MG TABLET: 3.125 | 30 days supply | Qty: 30 | Fill #1

## 2017-03-27 MED FILL — $CHANTIX STARTING MONTH BOX: 0.5 MG X 11 | 30 days supply | Qty: 53 | Fill #0

## 2017-03-27 MED FILL — GABAPENTIN 300 MG CAPSULE: 300 | 15 days supply | Qty: 30 | Fill #1

## 2017-03-27 MED FILL — ?CLOPIDOGREL 75MG TAB: 75 | 30 days supply | Qty: 30 | Fill #1

## 2017-04-25 ENCOUNTER — Other Ambulatory Visit: Payer: Self-pay | Admitting: Family Medicine

## 2017-04-25 ENCOUNTER — Encounter: Payer: Self-pay | Admitting: Family Medicine

## 2017-04-25 DIAGNOSIS — N951 Menopausal and female climacteric states: Secondary | ICD-10-CM

## 2017-04-25 MED ORDER — GABAPENTIN 300 MG PO CAPS: 900.0000 mg | ORAL_CAPSULE | Freq: Every day | ORAL | 3 refills | Status: DC

## 2017-04-25 MED FILL — ?ATORVASTATIN 40MG TABLET: 40 | 30 days supply | Qty: 30 | Fill #2

## 2017-04-25 MED FILL — ?CLOPIDOGREL 75MG TABLET: 75 | 30 days supply | Qty: 30 | Fill #2

## 2017-04-25 MED FILL — !VENTOLIN HFA INHALER: 108 (90 BAS | 25 days supply | Qty: 18 | Fill #2

## 2017-04-25 MED FILL — LISINOPRIL 5 MG TABLET: 5 | 30 days supply | Qty: 30 | Fill #2

## 2017-04-25 MED FILL — ?CARVEDILOL 3.125 MG TABLET: 3.125 | 30 days supply | Qty: 30 | Fill #2

## 2017-04-25 MED FILL — GABAPENTIN 300 MG CAPSULE: 300 | 30 days supply | Qty: 90 | Fill #0

## 2017-04-25 NOTE — Telephone Encounter (Signed)
Patient medication concern.

## 2017-04-29 ENCOUNTER — Ambulatory Visit: Payer: Self-pay | Attending: Family Medicine

## 2017-05-28 MED FILL — !VENTOLIN HFA INHALER: 108 (90 BAS | 25 days supply | Qty: 18 | Fill #3

## 2017-05-28 MED FILL — GABAPENTIN 300 MG CAPSULE: 300 | 30 days supply | Qty: 90 | Fill #1

## 2017-05-28 MED FILL — LISINOPRIL 5 MG TABLET: 5 | 30 days supply | Qty: 30 | Fill #3

## 2017-05-28 MED FILL — ?CARVEDILOL 3.125 MG TABLET: 3.125 | 30 days supply | Qty: 30 | Fill #3

## 2017-05-28 MED FILL — ?ATORVASTATIN 40MG TABLET: 40 | 30 days supply | Qty: 30 | Fill #3

## 2017-05-28 MED FILL — ?CLOPIDOGREL 75MG TABLET: 75 | 30 days supply | Qty: 30 | Fill #3

## 2017-06-11 ENCOUNTER — Ambulatory Visit (INDEPENDENT_AMBULATORY_CARE_PROVIDER_SITE_OTHER): Payer: Self-pay | Admitting: Cardiovascular Disease

## 2017-06-11 ENCOUNTER — Encounter: Payer: Self-pay | Admitting: Cardiovascular Disease

## 2017-06-11 VITALS — BP 114/68 | HR 92 | Ht 64.0 in | Wt 175.0 lb

## 2017-06-11 DIAGNOSIS — E785 Hyperlipidemia, unspecified: Secondary | ICD-10-CM

## 2017-06-11 DIAGNOSIS — I2510 Atherosclerotic heart disease of native coronary artery without angina pectoris: Secondary | ICD-10-CM

## 2017-06-11 DIAGNOSIS — Z720 Tobacco use: Secondary | ICD-10-CM

## 2017-06-11 DIAGNOSIS — I739 Peripheral vascular disease, unspecified: Secondary | ICD-10-CM

## 2017-06-11 MED ORDER — CLONAZEPAM 0.5 MG PO TABS: 0.2500 mg | ORAL_TABLET | Freq: Two times a day (BID) | ORAL | 0 refills | Status: DC | PRN

## 2017-06-11 NOTE — Progress Notes (Signed)
Cardiology Office Note   Date:  06/11/2017   ID:  Robin Walsh, DOB 1965-07-03, MRN [id number]  PCP:  Robin Rakes, MD  Cardiologist:  Dr. Marlou Porch  Chief Complaint  Patient presents with  . Edema      History of Present Illness: Robin Walsh is a 52 y.o. female who presents for a follow-up visit regarding  peripheral arterial disease. She has known history of coronary artery disease, hypertension, hyperlipidemia and tobacco use. She has known history of coronary artery disease status post LAD PCI in 2017. She is status post bilateral common iliac artery kissing stent placement in 12/2015 for severe claudication. She had no significant infrainguinal disease. She had recurrent right leg claudication.  Vascular studies in August 2018 showed normal ABI.  However, duplex showed significant in-stent restenosis in bilateral iliac arteries.  She reports stable bilateral leg claudication.  Unfortunately, she lost her job recently and has been under significant stress.  She continues to smoke but was able to get Chantix and wants to start that soon.  No chest pain or shortness of breath.  Past Medical History:  Diagnosis Date  . CAD (coronary artery disease)   . Hyperlipidemia   . Hypertension     Past Surgical History:  Procedure Laterality Date  . CARDIAC CATHETERIZATION N/A 11/28/2015   Procedure: Left Heart Cath and Coronary Angiography;  Surgeon: Jettie Booze, MD;  Location: [place] [facility];  Service: Cardiovascular;  Laterality: N/A;  . CARDIAC CATHETERIZATION N/A 11/28/2015   Procedure: Coronary Stent Intervention;  Surgeon: Jettie Booze, MD;  Location: [place] [facility];  Service: Cardiovascular;  Laterality: N/A;  DES to Mid LAD; 3.0x12 Synergy  . LEG SURGERY  2002?   tibial plateau fracture  . PERIPHERAL VASCULAR CATHETERIZATION N/A 01/04/2016   Procedure: Abdominal Aortogram;  Surgeon: Nelva Bush, MD;  Location: [place] [facility];  Service:  Cardiovascular;  Laterality: N/A;  . PERIPHERAL VASCULAR CATHETERIZATION Bilateral 01/04/2016   Procedure: Lower Extremity Angiography;  Surgeon: Nelva Bush, MD;  Location: [place] [facility];  Service: Cardiovascular;  Laterality: Bilateral;  . PERIPHERAL VASCULAR CATHETERIZATION Bilateral 01/04/2016   Procedure: Peripheral Vascular Intervention;  Surgeon: Nelva Bush, MD;  Location: [place] [facility];  Service: Cardiovascular;  Laterality: Bilateral;  common iliac     Current Outpatient Medications  Medication Sig Dispense Refill  . albuterol (PROVENTIL HFA;VENTOLIN HFA) 108 (90 Base) MCG/ACT inhaler Inhale 2 puffs into the lungs every 6 (six) hours as needed for wheezing or shortness of breath. 1 Inhaler 3  . aspirin 81 MG chewable tablet Chew 1 tablet (81 mg total) by mouth daily. 30 tablet 11  . atorvastatin (LIPITOR) 40 MG tablet Take 1 tablet (40 mg total) by mouth daily at 6 PM. 30 tablet 3  . carvedilol (COREG) 3.125 MG tablet Take 1 tablet (3.125 mg total) by mouth daily. 60 tablet 3  . clopidogrel (PLAVIX) 75 MG tablet Take 1 tablet (75 mg total) by mouth daily with breakfast. 30 tablet 3  . gabapentin (NEURONTIN) 300 MG capsule Take 3 capsules (900 mg total) by mouth at bedtime. 90 capsule 3  . lisinopril (PRINIVIL,ZESTRIL) 5 MG tablet Take 1 tablet (5 mg total) by mouth daily. 30 tablet 5  . nitroGLYCERIN (NITROSTAT) 0.4 MG SL tablet Place 0.4 mg under the tongue every 5 (five) minutes as needed for chest pain. X 3 doses    . varenicline (CHANTIX CONTINUING MONTH PAK) 1 MG tablet Take 1  tablet (1 mg total) by mouth 2 (two) times daily. 60 tablet 1  . varenicline (CHANTIX STARTING MONTH PAK) 0.5 MG X 11 & 1 MG X 42 tablet Take one 0.5 mg tablet by mouth once daily for 3 days, then increase to one 0.5 mg tablet twice daily for 4 days, then increase to one 1 mg tablet twice daily. 53 tablet 0   No current facility-administered medications for this visit.     Allergies:    Codeine and Dilaudid [hydromorphone hcl]    Social History:  The patient  reports that she has been smoking cigarettes.  She has a 12.50 pack-year smoking history. she has never used smokeless tobacco. She reports that she uses drugs. Drug: Marijuana. She reports that she does not drink alcohol.   Family History:  The patient's family history includes Alcohol abuse (age of onset: 15) in her father; Congestive Heart Failure (age of onset: 7) in her mother; Heart disease in her brother; Liver disease in her sister.    ROS:  Please see the history of present illness.   Otherwise, review of systems are positive for none.   All other systems are reviewed and negative.    PHYSICAL EXAM: VS:  BP 114/68   Pulse 92   Ht 5\' 4"  (1.626 m)   Wt 175 lb (79.4 kg)   BMI 30.04 kg/m  , BMI Body mass index is 30.04 kg/m. GEN: Well nourished, well developed, in no acute distress  HEENT: normal  Neck: no JVD, carotid bruits, or masses Cardiac: RRR; no murmurs, rubs, or gallops,no edema  Respiratory:  clear to auscultation bilaterally, normal work of breathing GI: soft, nontender, nondistended, + BS MS: no deformity or atrophy  Skin: warm and dry, no rash Neuro:  Strength and sensation are intact Psych: euthymic mood, full affect Vascular: Femoral pulses +1 bilaterally.  Distal pulses are barely palpable bilaterally.  EKG:  EKG is not ordered today.   Recent Labs: 09/07/2016: ALT 10 03/12/2017: BUN 7; Creatinine, Ser 1.11; Potassium 4.6; Sodium 139    Lipid Panel    Component Value Date/Time   CHOL 177 09/07/2016 0854   TRIG 312 (H) 09/07/2016 0854   HDL 31 (L) 09/07/2016 0854   CHOLHDL 5.7 (H) 09/07/2016 0854   CHOLHDL 7.8 11/27/2015 0453   VLDL 66 (H) 11/27/2015 0453   LDLCALC 84 09/07/2016 0854      Wt Readings from Last 3 Encounters:  06/11/17 175 lb (79.4 kg)  03/12/17 173 lb (78.5 kg)  02/26/17 173 lb 6.4 oz (78.7 kg)       No flowsheet data found.    ASSESSMENT AND  PLAN:  1.  Peripheral arterial disease: Stable bilateral leg claudication due to  in-stent restenosis in the common iliac arteries.    Her symptoms have been stable overall.  If there is any worsening, the plan is to proceed with angiography and possible endovascular intervention.  2. Tobacco use: She was able to get Chantix and will start using it soon with plans to quit smoking.  3. Coronary artery disease involving native coronary arteries with other forms of angina:   Continue medical therapy.  4. Hyperlipidemia: Continue treatment with atorvastatin with a target LDL of less than 70.  5.  Severe anxiety: Related to loss of job recently.  I agreed to give her one-time prescription of Klonopin 0.25 mg twice daily to be used as needed.  40 with no refills.  Disposition:   FU with me in  6 months  Signed,  [name], MD  06/11/2017 9:11 AM    [place]

## 2017-06-11 NOTE — Patient Instructions (Addendum)
Medication Instructions: Take the Klonopin 0.25 mg (half a tablet) twice daily as needed  If you need a refill on your cardiac medications before your next appointment, please call your pharmacy.    Follow-Up: Your physician wants you to follow-up in 6 months with Dr. Fletcher Anon. You will receive a reminder letter in the mail two months in advance. If you don't receive a letter, please call our office at [phone] to schedule this follow-up appointment.   Thank you for choosing [facility] at [facility]!!

## 2017-06-28 MED FILL — ?ATORVASTATIN 40MG TABLET: 40 | 30 days supply | Qty: 30 | Fill #3

## 2017-06-28 MED FILL — ?CLOPIDOGREL 75MG TA: 75 | 30 days supply | Qty: 30 | Fill #3

## 2017-06-28 MED FILL — LISINOPRIL 5 MG TAB: 5 | 30 days supply | Qty: 30 | Fill #4

## 2017-06-28 MED FILL — GABAPENTIN 300 MG CAPSULE: 300 | 30 days supply | Qty: 90 | Fill #2

## 2017-06-28 MED FILL — ?CARVEDILOL 3.125 MG TABLET: 3.125 | 30 days supply | Qty: 30 | Fill #4

## 2017-07-01 ENCOUNTER — Other Ambulatory Visit: Payer: Self-pay | Admitting: Family Medicine

## 2017-07-01 DIAGNOSIS — J40 Bronchitis, not specified as acute or chronic: Secondary | ICD-10-CM

## 2017-08-07 ENCOUNTER — Ambulatory Visit: Payer: Self-pay

## 2017-08-07 ENCOUNTER — Other Ambulatory Visit: Payer: Self-pay | Admitting: Family Medicine

## 2017-08-07 DIAGNOSIS — I2510 Atherosclerotic heart disease of native coronary artery without angina pectoris: Secondary | ICD-10-CM

## 2017-08-07 DIAGNOSIS — Z9861 Coronary angioplasty status: Principal | ICD-10-CM

## 2017-08-07 MED FILL — CARVEDILOL 3.125 MG TABLET: 3.125 | 30 days supply | Qty: 30 | Fill #5

## 2017-08-07 MED FILL — LISINOPRIL 5 MG TAB: 5 | 30 days supply | Qty: 30 | Fill #5

## 2017-08-07 MED FILL — GABAPENTIN 300 MG CAPSULE: 300 | 30 days supply | Qty: 90 | Fill #3

## 2017-08-07 MED FILL — !VENTOLIN HFA INHALER: 108 (90 BAS | 25 days supply | Qty: 18 | Fill #0

## 2017-08-08 MED FILL — CLOPIDOGREL 75 MG TABLET: 75 | 30 days supply | Qty: 30 | Fill #0

## 2017-08-08 MED FILL — ATORVASTATIN CALCIUM 40 MG: 40 | 30 days supply | Qty: 30 | Fill #0

## 2017-08-28 ENCOUNTER — Ambulatory Visit: Payer: Self-pay | Admitting: Family Medicine

## 2017-09-09 ENCOUNTER — Other Ambulatory Visit: Payer: Self-pay | Admitting: Family Medicine

## 2017-09-09 DIAGNOSIS — I10 Essential (primary) hypertension: Secondary | ICD-10-CM

## 2017-09-09 DIAGNOSIS — N951 Menopausal and female climacteric states: Secondary | ICD-10-CM

## 2017-09-09 MED FILL — ATORVASTATIN CALCIUM 40 MG: 40 | 30 days supply | Qty: 30 | Fill #1

## 2017-09-09 MED FILL — !VENTOLIN HFA INHALER: 108 (90 BAS | 25 days supply | Qty: 18 | Fill #1

## 2017-09-09 MED FILL — CLOPIDOGREL 75 MG TABLET: 75 | 30 days supply | Qty: 30 | Fill #1

## 2017-09-09 MED FILL — GABAPENTIN 300 MG CAPSULE: 300 | 30 days supply | Qty: 90 | Fill #0

## 2017-09-09 MED FILL — LISINOPRIL 5 MG TAB: 5 | 30 days supply | Qty: 30 | Fill #0

## 2017-09-09 MED FILL — CARVEDILOL 3.125 MG TABLET: 3.125 | 30 days supply | Qty: 30 | Fill #6

## 2017-09-16 ENCOUNTER — Ambulatory Visit: Payer: Self-pay

## 2017-09-25 ENCOUNTER — Ambulatory Visit: Payer: Self-pay | Attending: Family Medicine | Admitting: Family Medicine

## 2017-09-25 ENCOUNTER — Encounter: Payer: Self-pay | Admitting: Family Medicine

## 2017-09-25 VITALS — BP 115/75 | HR 81 | Temp 98.2°F | Ht 64.0 in | Wt 166.4 lb

## 2017-09-25 DIAGNOSIS — Z95820 Peripheral vascular angioplasty status with implants and grafts: Secondary | ICD-10-CM | POA: Insufficient documentation

## 2017-09-25 DIAGNOSIS — E785 Hyperlipidemia, unspecified: Secondary | ICD-10-CM | POA: Insufficient documentation

## 2017-09-25 DIAGNOSIS — R51 Headache: Secondary | ICD-10-CM | POA: Insufficient documentation

## 2017-09-25 DIAGNOSIS — Z79899 Other long term (current) drug therapy: Secondary | ICD-10-CM | POA: Insufficient documentation

## 2017-09-25 DIAGNOSIS — N951 Menopausal and female climacteric states: Secondary | ICD-10-CM

## 2017-09-25 DIAGNOSIS — M25561 Pain in right knee: Secondary | ICD-10-CM

## 2017-09-25 DIAGNOSIS — I739 Peripheral vascular disease, unspecified: Secondary | ICD-10-CM

## 2017-09-25 DIAGNOSIS — M25562 Pain in left knee: Secondary | ICD-10-CM | POA: Insufficient documentation

## 2017-09-25 DIAGNOSIS — F17200 Nicotine dependence, unspecified, uncomplicated: Secondary | ICD-10-CM

## 2017-09-25 DIAGNOSIS — I2510 Atherosclerotic heart disease of native coronary artery without angina pectoris: Secondary | ICD-10-CM

## 2017-09-25 DIAGNOSIS — Z955 Presence of coronary angioplasty implant and graft: Secondary | ICD-10-CM | POA: Insufficient documentation

## 2017-09-25 DIAGNOSIS — Z7902 Long term (current) use of antithrombotics/antiplatelets: Secondary | ICD-10-CM | POA: Insufficient documentation

## 2017-09-25 DIAGNOSIS — Z9889 Other specified postprocedural states: Secondary | ICD-10-CM | POA: Insufficient documentation

## 2017-09-25 DIAGNOSIS — R519 Headache, unspecified: Secondary | ICD-10-CM

## 2017-09-25 DIAGNOSIS — Z9861 Coronary angioplasty status: Secondary | ICD-10-CM

## 2017-09-25 DIAGNOSIS — Z7982 Long term (current) use of aspirin: Secondary | ICD-10-CM | POA: Insufficient documentation

## 2017-09-25 DIAGNOSIS — Z885 Allergy status to narcotic agent status: Secondary | ICD-10-CM | POA: Insufficient documentation

## 2017-09-25 DIAGNOSIS — I10 Essential (primary) hypertension: Secondary | ICD-10-CM

## 2017-09-25 DIAGNOSIS — Z87891 Personal history of nicotine dependence: Secondary | ICD-10-CM | POA: Insufficient documentation

## 2017-09-25 MED ORDER — DICLOFENAC SODIUM 1 % TD GEL: 4.0000 g | Freq: Four times a day (QID) | TRANSDERMAL | 1 refills | Status: DC

## 2017-09-25 MED ORDER — GABAPENTIN 300 MG PO CAPS: 900.0000 mg | ORAL_CAPSULE | Freq: Every day | ORAL | 6 refills | Status: DC

## 2017-09-25 MED ORDER — CARVEDILOL 3.125 MG PO TABS: 3.1250 mg | ORAL_TABLET | Freq: Every day | ORAL | 6 refills | Status: DC

## 2017-09-25 MED ORDER — LISINOPRIL 5 MG PO TABS: 5.0000 mg | ORAL_TABLET | Freq: Every day | ORAL | 6 refills | Status: DC

## 2017-09-25 MED ORDER — CETIRIZINE HCL 10 MG PO TABS: 10.0000 mg | ORAL_TABLET | Freq: Every day | ORAL | 1 refills | Status: DC

## 2017-09-25 MED FILL — DICLOFENAC SODIUM 1% GEL: 1 | 6 days supply | Qty: 100 | Fill #0

## 2017-09-25 NOTE — Patient Instructions (Signed)
Steps to Quit Smoking Smoking tobacco can be bad for your health. It can also affect almost every organ in your body. Smoking puts you and people around you at risk for many serious long-lasting (chronic) diseases. Quitting smoking is hard, but it is one of the best things that you can do for your health. It is never too late to quit. What are the benefits of quitting smoking? When you quit smoking, you lower your risk for getting serious diseases and conditions. They can include:  Lung cancer or lung disease.  Heart disease.  Stroke.  Heart attack.  Not being able to have children (infertility).  Weak bones (osteoporosis) and broken bones (fractures).  If you have coughing, wheezing, and shortness of breath, those symptoms may get better when you quit. You may also get sick less often. If you are pregnant, quitting smoking can help to lower your chances of having a baby of low birth weight. What can I do to help me quit smoking? Talk with your doctor about what can help you quit smoking. Some things you can do (strategies) include:  Quitting smoking totally, instead of slowly cutting back how much you smoke over a period of time.  Going to in-person counseling. You are more likely to quit if you go to many counseling sessions.  Using resources and support systems, such as: ? Online chats with a counselor. ? Phone quitlines. ? Printed self-help materials. ? Support groups or group counseling. ? Text messaging programs. ? Mobile phone apps or applications.  Taking medicines. Some of these medicines may have nicotine in them. If you are pregnant or breastfeeding, do not take any medicines to quit smoking unless your doctor says it is okay. Talk with your doctor about counseling or other things that can help you.  Talk with your doctor about using more than one strategy at the same time, such as taking medicines while you are also going to in-person counseling. This can help make  quitting easier. What things can I do to make it easier to quit? Quitting smoking might feel very hard at first, but there is a lot that you can do to make it easier. Take these steps:  Talk to your family and friends. Ask them to support and encourage you.  Call phone quitlines, reach out to support groups, or work with a counselor.  Ask people who smoke to not smoke around you.  Avoid places that make you want (trigger) to smoke, such as: ? Bars. ? Parties. ? Smoke-break areas at work.  Spend time with people who do not smoke.  Lower the stress in your life. Stress can make you want to smoke. Try these things to help your stress: ? Getting regular exercise. ? Deep-breathing exercises. ? Yoga. ? Meditating. ? Doing a body scan. To do this, close your eyes, focus on one area of your body at a time from head to toe, and notice which parts of your body are tense. Try to relax the muscles in those areas.  Download or buy apps on your mobile phone or tablet that can help you stick to your quit plan. There are many free apps, such as QuitGuide from the CDC (Centers for Disease Control and Prevention). You can find more support from smokefree.gov and other websites.  This information is not intended to replace advice given to you by your health care provider. Make sure you discuss any questions you have with your health care provider. Document Released: 01/20/2009 Document   Revised: 11/22/2015 Document Reviewed: 08/10/2014 Elsevier Interactive Patient Education  2018 Elsevier Inc.  

## 2017-09-25 NOTE — Progress Notes (Signed)
Subjective:  Patient ID: Robin Walsh, female    DOB: 01-16-1966  Age: 52 y.o. MRN: [id number]  CC: Hypertension   HPI Virjean A Janosik  is a 52 year old female with a history of tobacco abuse, coronary artery disease (status post PCI and drug-eluting stent placement to LAD in 11/2015) peripheral arterial disease (status post stenting of bilateral common iliac artery) who presents today for follow-up visit. She complains of frontal headache right above her eyes which have been intermittent and unrelieved by the use of Tylenol.  She denies photophobia, blurry vision, nausea or vomiting.  She also has intermittent pain in both knees which occur in her left posterior knee and right anterior knee.  Left knee pain feels like it is about to twist or that she overextended it; she has had a previous history of left tibia surgery after trauma and left knee pain is worse than right.  At the moment she denies pain or swelling in her knees.  Denies claudication pain. Her hot flashes are controlled on gabapentin. Denies chest pains or shortness of breath and is tolerating her Plavix, Lipitor. She continues to smoke half a pack of cigarettes per day which is down from three-quarter pack previously and had placed on Chantix to help her quit.  Past Medical History:  Diagnosis Date  . CAD (coronary artery disease)   . Hyperlipidemia   . Hypertension     Past Surgical History:  Procedure Laterality Date  . CARDIAC CATHETERIZATION N/A 11/28/2015   Procedure: Left Heart Cath and Coronary Angiography;  Surgeon: Jettie Booze, MD;  Location: [place] [facility];  Service: Cardiovascular;  Laterality: N/A;  . CARDIAC CATHETERIZATION N/A 11/28/2015   Procedure: Coronary Stent Intervention;  Surgeon: Jettie Booze, MD;  Location: [place] [facility];  Service: Cardiovascular;  Laterality: N/A;  DES to Mid LAD; 3.0x12 Synergy  . LEG SURGERY  2002?   tibial plateau fracture  . PERIPHERAL VASCULAR  CATHETERIZATION N/A 01/04/2016   Procedure: Abdominal Aortogram;  Surgeon: Nelva Bush, MD;  Location: [place] [facility];  Service: Cardiovascular;  Laterality: N/A;  . PERIPHERAL VASCULAR CATHETERIZATION Bilateral 01/04/2016   Procedure: Lower Extremity Angiography;  Surgeon: Nelva Bush, MD;  Location: [place] [facility];  Service: Cardiovascular;  Laterality: Bilateral;  . PERIPHERAL VASCULAR CATHETERIZATION Bilateral 01/04/2016   Procedure: Peripheral Vascular Intervention;  Surgeon: Nelva Bush, MD;  Location: [place] [facility];  Service: Cardiovascular;  Laterality: Bilateral;  common iliac    Allergies  Allergen Reactions  . Codeine Nausea And Vomiting  . Dilaudid [Hydromorphone Hcl] Nausea And Vomiting     Outpatient Medications Prior to Visit  Medication Sig Dispense Refill  . aspirin 81 MG chewable tablet Chew 1 tablet (81 mg total) by mouth daily. 30 tablet 11  . atorvastatin (LIPITOR) 40 MG tablet Take 1 tablet (40 mg total) by mouth daily at 6 PM. 30 tablet 3  . atorvastatin (LIPITOR) 40 MG tablet TAKE 1 TABLET BY MOUTH DAILY AT 6 PM. 30 tablet 3  . clopidogrel (PLAVIX) 75 MG tablet Take 1 tablet (75 mg total) by mouth daily with breakfast. 30 tablet 3  . clopidogrel (PLAVIX) 75 MG tablet TAKE 1 TABLET BY MOUTH DAILY WITH BREAKFAST. 30 tablet 3  . nitroGLYCERIN (NITROSTAT) 0.4 MG SL tablet Place 0.4 mg under the tongue every 5 (five) minutes as needed for chest pain. X 3 doses    . VENTOLIN HFA 108 (90 Base) MCG/ACT inhaler INHALE 2 PUFFS INTO  THE LUNGS EVERY 6 (SIX) HOURS AS NEEDED FOR WHEEZING OR SHORTNESS OF BREATH. 18 g 1  . carvedilol (COREG) 3.125 MG tablet Take 1 tablet (3.125 mg total) by mouth daily. 60 tablet 3  . gabapentin (NEURONTIN) 300 MG capsule TAKE 3 CAPSULES (900 MG TOTAL) BY MOUTH AT BEDTIME. 90 capsule 0  . lisinopril (PRINIVIL,ZESTRIL) 5 MG tablet TAKE 1 TABLET BY MOUTH DAILY. 30 tablet 0  . clonazePAM (KLONOPIN) 0.5 MG tablet Take 0.5 tablets  (0.25 mg total) by mouth 2 (two) times daily as needed for anxiety. (Patient not taking: Reported on 09/25/2017) 40 tablet 0  . varenicline (CHANTIX CONTINUING MONTH PAK) 1 MG tablet Take 1 tablet (1 mg total) by mouth 2 (two) times daily. (Patient not taking: Reported on 09/25/2017) 60 tablet 1  . varenicline (CHANTIX STARTING MONTH PAK) 0.5 MG X 11 & 1 MG X 42 tablet Take one 0.5 mg tablet by mouth once daily for 3 days, then increase to one 0.5 mg tablet twice daily for 4 days, then increase to one 1 mg tablet twice daily. (Patient not taking: Reported on 09/25/2017) 53 tablet 0   No facility-administered medications prior to visit.     ROS Review of Systems  Constitutional: Negative for activity change, appetite change and fatigue.  HENT: Negative for congestion, sinus pressure and sore throat.   Eyes: Negative for visual disturbance.  Respiratory: Negative for cough, chest tightness, shortness of breath and wheezing.   Cardiovascular: Negative for chest pain and palpitations.  Gastrointestinal: Negative for abdominal distention, abdominal pain and constipation.  Endocrine: Negative for polydipsia.  Genitourinary: Negative for dysuria and frequency.  Musculoskeletal: Negative for arthralgias and back pain.  Skin: Negative for rash.  Neurological: Negative for tremors, light-headedness and numbness.  Hematological: Does not bruise/bleed easily.  Psychiatric/Behavioral: Negative for agitation and behavioral problems.    Objective:  BP 115/75   Pulse 81   Temp 98.2 F (36.8 C) (Oral)   Ht _0  (1.626 m)   Wt 166 lb 6.4 oz (75.5 kg)   SpO2 97%   BMI 28.56 kg/m   BP/Weight 09/25/2017 06/11/2017 44/06/1538  Systolic BP 086 761 950  Diastolic BP 75 68 60  Wt. (Lbs) 166.4 175 173  BMI 28.56 30.04 29.7      Physical Exam  Constitutional: She is oriented to person, place, and time. She appears well-developed and well-nourished.  Cardiovascular: Normal rate, normal heart sounds and  intact distal pulses.  No murmur heard. Pulmonary/Chest: Effort normal and breath sounds normal. She has no wheezes. She has no rales. She exhibits no tenderness.  Abdominal: Soft. Bowel sounds are normal. She exhibits no distension and no mass. There is no tenderness.  Musculoskeletal: Normal range of motion.  Neurological: She is alert and oriented to person, place, and time.  Skin: Skin is warm and dry.  Psychiatric: She has a normal mood and affect.     Assessment & Plan:   1. Essential hypertension Controlled Counseled on blood pressure goal of less than 130/80, low-sodium, DASH diet, medication compliance, 150 minutes of moderate intensity exercise per week. Discussed medication compliance, adverse effects. - carvedilol (COREG) 3.125 MG tablet; Take 1 tablet (3.125 mg total) by mouth daily.  Dispense: 60 tablet; Refill: 6 - lisinopril (PRINIVIL,ZESTRIL) 5 MG tablet; Take 1 tablet (5 mg total) by mouth daily.  Dispense: 30 tablet; Refill: 6 - CMP14+EGFR; Future - Lipid panel; Future - HIV antibody  2. Hot flash, menopausal Controlled - gabapentin (NEURONTIN) 300  MG capsule; Take 3 capsules (900 mg total) by mouth at bedtime.  Dispense: 90 capsule; Refill: 6  3. CAD S/P DES PCI TO mLAD - Synergy DES 3.0 mm x 12 mm Asymptomatic Risk factor modification Follow-up with cardiology  4. PAD (peripheral artery disease) (HCC) Status post stenting of bilateral common iliac artery Risk factor modification including smoking cessation  5. Tobacco dependence Spent 3 minutes counseling on smoking cessation She is currently on Chantix but continues to smoke  6. Sinus headache - cetirizine (ZYRTEC) 10 MG tablet; Take 1 tablet (10 mg total) by mouth daily.  Dispense: 30 tablet; Refill: 1  7. Acute pain of both knees Unable to place on oral NSAIDs due to her being on Plavix - diclofenac sodium (VOLTAREN) 1 % GEL; Apply 4 g topically 4 (four) times daily.  Dispense: 100 g; Refill:  1   Meds ordered this encounter  Medications  . carvedilol (COREG) 3.125 MG tablet    Sig: Take 1 tablet (3.125 mg total) by mouth daily.    Dispense:  60 tablet    Refill:  6  . lisinopril (PRINIVIL,ZESTRIL) 5 MG tablet    Sig: Take 1 tablet (5 mg total) by mouth daily.    Dispense:  30 tablet    Refill:  6  . gabapentin (NEURONTIN) 300 MG capsule    Sig: Take 3 capsules (900 mg total) by mouth at bedtime.    Dispense:  90 capsule    Refill:  6  . cetirizine (ZYRTEC) 10 MG tablet    Sig: Take 1 tablet (10 mg total) by mouth daily.    Dispense:  30 tablet    Refill:  1  . diclofenac sodium (VOLTAREN) 1 % GEL    Sig: Apply 4 g topically 4 (four) times daily.    Dispense:  100 g    Refill:  1    Follow-up: Return in about 6 months (around 03/27/2018) for Follow-up of chronic medical conditions.   Charlott Rakes MD

## 2017-09-26 ENCOUNTER — Ambulatory Visit: Payer: Self-pay | Attending: Family Medicine

## 2017-09-26 DIAGNOSIS — I10 Essential (primary) hypertension: Secondary | ICD-10-CM | POA: Insufficient documentation

## 2017-09-26 NOTE — Progress Notes (Signed)
Patient here for lab visit only 

## 2017-09-27 LAB — CMP14+EGFR
ALBUMIN: 4.3 g/dL (ref 3.5–5.5)
ALT: 6 IU/L (ref 0–32)
AST: 9 IU/L (ref 0–40)
Albumin/Globulin Ratio: 1.7 (ref 1.2–2.2)
Alkaline Phosphatase: 65 IU/L (ref 39–117)
BILIRUBIN TOTAL: 0.2 mg/dL (ref 0.0–1.2)
BUN / CREAT RATIO: 10 (ref 9–23)
BUN: 11 mg/dL (ref 6–24)
CALCIUM: 9.3 mg/dL (ref 8.7–10.2)
CHLORIDE: 107 mmol/L — AB (ref 96–106)
CO2: 22 mmol/L (ref 20–29)
CREATININE: 1.05 mg/dL — AB (ref 0.57–1.00)
GFR calc Af Amer: 72 mL/min/{1.73_m2} (ref >59)
GFR, EST NON AFRICAN AMERICAN: 63 mL/min/{1.73_m2} (ref >59)
GLUCOSE: 93 mg/dL (ref 65–99)
Globulin, Total: 2.6 g/dL (ref 1.5–4.5)
Potassium: 4.9 mmol/L (ref 3.5–5.2)
Sodium: 140 mmol/L (ref 134–144)
Total Protein: 6.9 g/dL (ref 6.0–8.5)

## 2017-09-27 LAB — LIPID PANEL
Chol/HDL Ratio: 5.6 ratio — ABNORMAL HIGH (ref 0.0–4.4)
Cholesterol, Total: 145 mg/dL (ref 100–199)
HDL: 26 mg/dL — ABNORMAL LOW (ref >39)
LDL Calculated: 74 mg/dL (ref 0–99)
Triglycerides: 225 mg/dL — ABNORMAL HIGH (ref 0–149)
VLDL Cholesterol Cal: 45 mg/dL — ABNORMAL HIGH (ref 5–40)

## 2017-09-27 LAB — HIV ANTIBODY (ROUTINE TESTING W REFLEX): HIV Screen 4th Generation wRfx: NONREACTIVE

## 2017-10-09 MED FILL — GABAPENTIN 300 MG CAPSULE: 300 | 30 days supply | Qty: 90 | Fill #0

## 2017-10-09 MED FILL — CLOPIDOGREL 75 MG TABLET: 75 | 30 days supply | Qty: 30 | Fill #2

## 2017-10-09 MED FILL — ?CARVEDILOL 3.125 MG TABLET: 3.125 | 30 days supply | Qty: 30 | Fill #7

## 2017-10-09 MED FILL — LISINOPRIL 5 MG TAB: 5 | 30 days supply | Qty: 30 | Fill #0

## 2017-10-09 MED FILL — ?ATORVASTATIN 40MG TABLET: 40 | 30 days supply | Qty: 30 | Fill #2

## 2017-10-09 MED FILL — !VENTOLIN HFA INHALER: 108 (90 BAS | 25 days supply | Qty: 18 | Fill #3

## 2017-10-31 ENCOUNTER — Other Ambulatory Visit: Payer: Self-pay | Admitting: Cardiovascular Disease

## 2017-10-31 DIAGNOSIS — I771 Stricture of artery: Secondary | ICD-10-CM

## 2017-11-08 ENCOUNTER — Other Ambulatory Visit: Payer: Self-pay | Admitting: Family Medicine

## 2017-11-08 DIAGNOSIS — J40 Bronchitis, not specified as acute or chronic: Secondary | ICD-10-CM

## 2017-11-08 MED FILL — LISINOPRIL 5 MG TAB: 5 | 30 days supply | Qty: 30 | Fill #1

## 2017-11-08 MED FILL — ?CARVEDILOL 3.125 MG TABLET: 3.125 | 60 days supply | Qty: 60 | Fill #0

## 2017-11-08 MED FILL — ?CLOPIDOGREL 75MG TAB: 75 | 30 days supply | Qty: 30 | Fill #3

## 2017-11-08 MED FILL — ?ATORVASTATIN 40MG TABLET: 40 | 30 days supply | Qty: 30 | Fill #3

## 2017-11-08 MED FILL — GABAPENTIN 300 MG CAPSULE: 300 | 30 days supply | Qty: 90 | Fill #1

## 2017-11-08 MED FILL — !VENTOLIN HFA INHALER: 108 (90 BAS | 75 days supply | Qty: 54 | Fill #0

## 2017-11-22 ENCOUNTER — Ambulatory Visit (HOSPITAL_COMMUNITY)
Admission: RE | Admit: 2017-11-22 | Discharge: 2017-11-22 | Disposition: A | Discharge status: Home / Self Care | Payer: No Typology Code available for payment source | Source: Ambulatory Visit | Attending: Cardiology | Admitting: Cardiology

## 2017-11-22 ENCOUNTER — Encounter (HOSPITAL_COMMUNITY): Payer: Self-pay

## 2017-11-22 DIAGNOSIS — I771 Stricture of artery: Secondary | ICD-10-CM | POA: Insufficient documentation

## 2017-11-25 ENCOUNTER — Encounter (HOSPITAL_COMMUNITY): Payer: Self-pay

## 2017-12-02 ENCOUNTER — Telehealth: Payer: Self-pay | Admitting: Cardiovascular Disease

## 2017-12-02 NOTE — Telephone Encounter (Signed)
Patient calling to discuss recent Vascular testing results  ° °Please call  ° °

## 2017-12-02 NOTE — Telephone Encounter (Signed)
Patient made aware of results and verbalized understanding. Appointment made for tomorrow with Dr. Fletcher Anon.   Notes recorded by Wellington Hampshire, MD on 11/29/2017 at 4:33 PM EDT Inform patient that Doppler showed a drop in ABI bilaterally with evidence of scarring inside the stents leading to narrowing again. Scheduled her for follow-up with me in 2 to 3 weeks to discuss options of treatment.

## 2017-12-03 ENCOUNTER — Ambulatory Visit (INDEPENDENT_AMBULATORY_CARE_PROVIDER_SITE_OTHER): Payer: No Typology Code available for payment source | Admitting: Cardiovascular Disease

## 2017-12-03 VITALS — BP 132/82 | HR 73 | Ht 64.0 in | Wt 167.0 lb

## 2017-12-03 DIAGNOSIS — Z720 Tobacco use: Secondary | ICD-10-CM

## 2017-12-03 DIAGNOSIS — I739 Peripheral vascular disease, unspecified: Secondary | ICD-10-CM

## 2017-12-03 DIAGNOSIS — E785 Hyperlipidemia, unspecified: Secondary | ICD-10-CM

## 2017-12-03 DIAGNOSIS — I10 Essential (primary) hypertension: Secondary | ICD-10-CM

## 2017-12-03 DIAGNOSIS — I25118 Atherosclerotic heart disease of native coronary artery with other forms of angina pectoris: Secondary | ICD-10-CM

## 2017-12-03 NOTE — Patient Instructions (Signed)
Medication Instructions:  Your physician recommends that you continue on your current medications as directed. Please refer to the Current Medication list given to you today.   Labwork: none  Testing/Procedures: none  Follow-Up: Your physician recommends that you schedule a follow-up appointment in: 3 months with Dr. Arida   Any Other Special Instructions Will Be Listed Below (If Applicable).     If you need a refill on your cardiac medications before your next appointment, please call your pharmacy.   

## 2017-12-03 NOTE — Progress Notes (Signed)
Cardiology Office Note   Date:  12/03/2017   ID:  JEYLI ZWICKER, DOB [birth year], MRN [id number]  PCP:  Charlott Rakes, MD  Cardiologist:  Dr. Marlou Porch  No chief complaint on file.     History of Present Illness: Robin Walsh is a 52 y.o. female who presents for a follow-up visit regarding  peripheral arterial disease. She has known history of coronary artery disease, hypertension, hyperlipidemia and tobacco use. She has known history of coronary artery disease status post LAD PCI in 2017. She is status post bilateral common iliac artery kissing stent placement in 12/2015 for severe claudication. She had no significant infrainguinal disease. She had recurrent right leg claudication.  Vascular studies in August 2018 showed normal ABI.  However, duplex showed significant in-stent restenosis in bilateral iliac arteries.  She reports stable bilateral leg claudication which happens after walking about 200 feet and forces her to stop and rest before she can resume walking.  The symptoms have been stable.  Repeat vascular studies recently showed a drop in ABI to the 0.7 range bilaterally with significant restenosis in the proximal segments of the iliac stents.  Past Medical History:  Diagnosis Date  . CAD (coronary artery disease)   . Hyperlipidemia   . Hypertension     Past Surgical History:  Procedure Laterality Date  . CARDIAC CATHETERIZATION N/A 11/28/2015   Procedure: Left Heart Cath and Coronary Angiography;  Surgeon: Jettie Booze, MD;  Location: [facility];  Service: Cardiovascular;  Laterality: N/A;  . CARDIAC CATHETERIZATION N/A 11/28/2015   Procedure: Coronary Stent Intervention;  Surgeon: Jettie Booze, MD;  Location: [facility];  Service: Cardiovascular;  Laterality: N/A;  DES to Mid LAD; 3.0x12 Synergy  . LEG SURGERY  2002?   tibial plateau fracture  . PERIPHERAL VASCULAR CATHETERIZATION N/A 01/04/2016   Procedure: Abdominal Aortogram;   Surgeon: Nelva Bush, MD;  Location: [facility];  Service: Cardiovascular;  Laterality: N/A;  . PERIPHERAL VASCULAR CATHETERIZATION Bilateral 01/04/2016   Procedure: Lower Extremity Angiography;  Surgeon: Nelva Bush, MD;  Location: [facility];  Service: Cardiovascular;  Laterality: Bilateral;  . PERIPHERAL VASCULAR CATHETERIZATION Bilateral 01/04/2016   Procedure: Peripheral Vascular Intervention;  Surgeon: Nelva Bush, MD;  Location: [facility];  Service: Cardiovascular;  Laterality: Bilateral;  common iliac     Current Outpatient Medications  Medication Sig Dispense Refill  . aspirin 81 MG chewable tablet Chew 1 tablet (81 mg total) by mouth daily. 30 tablet 11  . atorvastatin (LIPITOR) 40 MG tablet Take 1 tablet (40 mg total) by mouth daily at 6 PM. 30 tablet 3  . atorvastatin (LIPITOR) 40 MG tablet TAKE 1 TABLET BY MOUTH DAILY AT 6 PM. 30 tablet 3  . carvedilol (COREG) 3.125 MG tablet Take 1 tablet (3.125 mg total) by mouth daily. 60 tablet 6  . cetirizine (ZYRTEC) 10 MG tablet Take 1 tablet (10 mg total) by mouth daily. 30 tablet 1  . clonazePAM (KLONOPIN) 0.5 MG tablet Take 0.5 tablets (0.25 mg total) by mouth 2 (two) times daily as needed for anxiety. (Patient not taking: Reported on 09/25/2017) 40 tablet 0  . clopidogrel (PLAVIX) 75 MG tablet Take 1 tablet (75 mg total) by mouth daily with breakfast. 30 tablet 3  . clopidogrel (PLAVIX) 75 MG tablet TAKE 1 TABLET BY MOUTH DAILY WITH BREAKFAST. 30 tablet 3  . diclofenac sodium (VOLTAREN) 1 % GEL Apply 4 g topically 4 (four) times  daily. 100 g 1  . gabapentin (NEURONTIN) 300 MG capsule Take 3 capsules (900 mg total) by mouth at bedtime. 90 capsule 6  . lisinopril (PRINIVIL,ZESTRIL) 5 MG tablet Take 1 tablet (5 mg total) by mouth daily. 30 tablet 6  . nitroGLYCERIN (NITROSTAT) 0.4 MG SL tablet Place 0.4 mg under the tongue every 5 (five) minutes as needed for chest pain. X 3 doses    . varenicline (CHANTIX  CONTINUING MONTH PAK) 1 MG tablet Take 1 tablet (1 mg total) by mouth 2 (two) times daily. (Patient not taking: Reported on 09/25/2017) 60 tablet 1  . varenicline (CHANTIX STARTING MONTH PAK) 0.5 MG X 11 & 1 MG X 42 tablet Take one 0.5 mg tablet by mouth once daily for 3 days, then increase to one 0.5 mg tablet twice daily for 4 days, then increase to one 1 mg tablet twice daily. (Patient not taking: Reported on 09/25/2017) 53 tablet 0  . VENTOLIN HFA 108 (90 Base) MCG/ACT inhaler INHALE 2 PUFFS INTO THE LUNGS EVERY 6 HOURS AS NEEDED FOR WHEEZING OR SHORTNESS OF BREATH. 18 g 2   No current facility-administered medications for this visit.     Allergies:   Codeine and Dilaudid [hydromorphone hcl]    Social History:  The patient  reports that she has been smoking cigarettes. She has a 12.50 pack-year smoking history. She has never used smokeless tobacco. She reports that she has current or past drug history. Drug: Marijuana. She reports that she does not drink alcohol.   Family History:  The patient's family history includes Alcohol abuse (age of onset: 58) in her father; Congestive Heart Failure (age of onset: 62) in her mother; Heart disease in her brother; Liver disease in her sister.    ROS:  Please see the history of present illness.   Otherwise, review of systems are positive for none.   All other systems are reviewed and negative.    PHYSICAL EXAM: VS:  BP 132/82 (BP Location: Left Arm, Patient Position: Sitting, Cuff Size: Normal)   Pulse 73   Ht 5\' 4"  (1.626 m)   Wt 167 lb (75.8 kg)   BMI 28.67 kg/m  , BMI Body mass index is 28.67 kg/m. GEN: Well nourished, well developed, in no acute distress  HEENT: normal  Neck: no JVD, carotid bruits, or masses Cardiac: RRR; no murmurs, rubs, or gallops,no edema  Respiratory:  clear to auscultation bilaterally, normal work of breathing GI: soft, nontender, nondistended, + BS MS: no deformity or atrophy  Skin: warm and dry, no rash Neuro:   Strength and sensation are intact Psych: euthymic mood, full affect Vascular: Femoral pulses +1 bilaterally.  Distal pulses are barely palpable bilaterally.  EKG:  EKG is ordered today. EKG showed normal sinus rhythm with no significant ST or T wave changes.  Recent Labs: 09/26/2017: ALT 6; BUN 11; Creatinine, Ser 1.05; Potassium 4.9; Sodium 140    Lipid Panel    Component Value Date/Time   CHOL 145 09/26/2017 0957   TRIG 225 (H) 09/26/2017 0957   HDL 26 (L) 09/26/2017 0957   CHOLHDL 5.6 (H) 09/26/2017 0957   CHOLHDL 7.8 11/27/2015 0453   VLDL 66 (H) 11/27/2015 0453   LDLCALC 74 09/26/2017 0957      Wt Readings from Last 3 Encounters:  12/03/17 167 lb (75.8 kg)  09/25/17 166 lb 6.4 oz (75.5 kg)  06/11/17 175 lb (79.4 kg)       No flowsheet data found.  ASSESSMENT AND PLAN:  1.  Peripheral arterial disease: Stable bilateral leg claudication due to  in-stent restenosis in the common iliac arteries.    Her symptoms have been stable overall.  I discussed with her the option of proceeding with angiography and repeat intervention but I explained to her it probably best to wait until she is able to cut down on tobacco use given the high risk for restenosis with continued tobacco use.  She is going to continue to walk and if there is worsening in symptoms we will proceed with revascularization.  2. Tobacco use: She has Chantix at home and is going to try to quit again.  3. Coronary artery disease involving native coronary arteries with other forms of angina:   Continue medical therapy.  4. Hyperlipidemia: Continue treatment with atorvastatin with a target LDL of less than 70.   Disposition:   FU with me in 3 months  Signed,  [name], MD  12/03/2017 8:37 AM    [place]

## 2017-12-10 ENCOUNTER — Other Ambulatory Visit: Payer: Self-pay | Admitting: Family Medicine

## 2017-12-10 DIAGNOSIS — I2510 Atherosclerotic heart disease of native coronary artery without angina pectoris: Secondary | ICD-10-CM

## 2017-12-10 DIAGNOSIS — Z9861 Coronary angioplasty status: Principal | ICD-10-CM

## 2017-12-10 MED FILL — LISINOPRIL 5 MG TAB: 5 | 30 days supply | Qty: 30 | Fill #2

## 2017-12-10 MED FILL — ?ATORVASTATIN 40MG TABLET: 40 | 30 days supply | Qty: 30 | Fill #0

## 2017-12-10 MED FILL — GABAPENTIN 300 MG CAPSULE: 300 | 30 days supply | Qty: 90 | Fill #2

## 2017-12-11 ENCOUNTER — Ambulatory Visit: Payer: Self-pay | Attending: Family Medicine | Admitting: Physician Assistant

## 2017-12-11 ENCOUNTER — Other Ambulatory Visit: Payer: Self-pay

## 2017-12-11 VITALS — BP 129/78 | HR 87 | Temp 98.2°F | Resp 18 | Ht 64.0 in | Wt 167.0 lb

## 2017-12-11 DIAGNOSIS — Z885 Allergy status to narcotic agent status: Secondary | ICD-10-CM | POA: Insufficient documentation

## 2017-12-11 DIAGNOSIS — E785 Hyperlipidemia, unspecified: Secondary | ICD-10-CM | POA: Insufficient documentation

## 2017-12-11 DIAGNOSIS — F32A Depression, unspecified: Secondary | ICD-10-CM

## 2017-12-11 DIAGNOSIS — I2510 Atherosclerotic heart disease of native coronary artery without angina pectoris: Secondary | ICD-10-CM

## 2017-12-11 DIAGNOSIS — Z9861 Coronary angioplasty status: Secondary | ICD-10-CM

## 2017-12-11 DIAGNOSIS — F419 Anxiety disorder, unspecified: Secondary | ICD-10-CM | POA: Insufficient documentation

## 2017-12-11 DIAGNOSIS — F329 Major depressive disorder, single episode, unspecified: Secondary | ICD-10-CM | POA: Insufficient documentation

## 2017-12-11 DIAGNOSIS — Z79899 Other long term (current) drug therapy: Secondary | ICD-10-CM | POA: Insufficient documentation

## 2017-12-11 DIAGNOSIS — F17200 Nicotine dependence, unspecified, uncomplicated: Secondary | ICD-10-CM | POA: Insufficient documentation

## 2017-12-11 DIAGNOSIS — I10 Essential (primary) hypertension: Secondary | ICD-10-CM | POA: Insufficient documentation

## 2017-12-11 DIAGNOSIS — Z7982 Long term (current) use of aspirin: Secondary | ICD-10-CM | POA: Insufficient documentation

## 2017-12-11 MED ORDER — SERTRALINE HCL 50 MG PO TABS: ORAL_TABLET | ORAL | 3 refills | Status: DC

## 2017-12-11 MED ORDER — NITROGLYCERIN 0.4 MG SL SUBL: 0.4000 mg | SUBLINGUAL_TABLET | SUBLINGUAL | 1 refills | Status: DC | PRN

## 2017-12-11 MED ORDER — CLONAZEPAM 0.5 MG PO TABS: 0.2500 mg | ORAL_TABLET | Freq: Two times a day (BID) | ORAL | 0 refills | Status: DC | PRN

## 2017-12-11 MED ORDER — CLOPIDOGREL BISULFATE 75 MG PO TABS: 75.0000 mg | ORAL_TABLET | Freq: Every day | ORAL | 3 refills | Status: DC

## 2017-12-11 MED FILL — ?CLOPIDOGREL 75MG TAB: 75 | 30 days supply | Qty: 30 | Fill #0

## 2017-12-11 MED FILL — SERTRALINE HCL 50 MG TABS: 50 | 33 days supply | Qty: 30 | Fill #0

## 2017-12-11 NOTE — Progress Notes (Signed)
Patient ID: [name], female   DOB: [birth year], 52 y.o.   MRN: [id number]   Robin Walsh, is a 52 y.o. female  VOH:[id number]  IRS:[id number]  DOB - 1965-07-20  Subjective:  Chief Complaint and HPI: Robin Walsh is a 52 y.o. female here today wanting to try medication for anxiety and depression.  She lost her job in February of this year.  Denies SI/HI.  Declines meeting/counseling with our Education officer, [employer].  She says she is set up with EBT and has help with applying for jobs.  She was previously on Prozac but it didn't help.  She would like clonazepam and expresses understanding the chemical dependence properties of it.    She has anhedonia.  No weapons in the home.  Feels unmotivated.  Smokes and drinks sodas, no water.    Compliant with meds.  No CP/HA/dizziness.   Social:  Not working, smokes  ROS:   Constitutional:  No f/c, No night sweats, No unexplained weight loss. EENT:  No vision changes, No blurry vision, No hearing changes. No mouth, throat, or ear problems.  Respiratory: No cough, No SOB Cardiac: No CP, no palpitations GI:  No abd pain, No N/V/D. GU: No Urinary s/sx Musculoskeletal: No joint pain Neuro: No headache, no dizziness, no motor weakness.  Skin: No rash Endocrine:  No polydipsia. No polyuria.  Psych: Denies SI/HI  No problems updated.  ALLERGIES: Allergies  Allergen Reactions  . Codeine Nausea And Vomiting  . Dilaudid [Hydromorphone Hcl] Nausea And Vomiting    PAST MEDICAL HISTORY: Past Medical History:  Diagnosis Date  . CAD (coronary artery disease)   . Hyperlipidemia   . Hypertension     MEDICATIONS AT HOME: Prior to Admission medications   Medication Sig Start Date End Date Taking? Authorizing Provider  aspirin 81 MG chewable tablet Chew 1 tablet (81 mg total) by mouth daily. 02/26/17  Yes Newlin, Charlane Ferretti, MD  atorvastatin (LIPITOR) 40 MG tablet TAKE 1 TABLET BY MOUTH DAILY AT 6 PM. 12/10/17  Yes Newlin, Enobong, MD  carvedilol (COREG)  3.125 MG tablet Take 1 tablet (3.125 mg total) by mouth daily. 09/25/17  Yes Charlott Rakes, MD  cetirizine (ZYRTEC) 10 MG tablet Take 1 tablet (10 mg total) by mouth daily. 09/25/17  Yes Charlott Rakes, MD  clopidogrel (PLAVIX) 75 MG tablet Take 1 tablet (75 mg total) by mouth daily with breakfast. 02/26/17  Yes Newlin, Enobong, MD  diclofenac sodium (VOLTAREN) 1 % GEL Apply 4 g topically 4 (four) times daily. 09/25/17  Yes Charlott Rakes, MD  gabapentin (NEURONTIN) 300 MG capsule Take 3 capsules (900 mg total) by mouth at bedtime. 09/25/17  Yes Charlott Rakes, MD  lisinopril (PRINIVIL,ZESTRIL) 5 MG tablet Take 1 tablet (5 mg total) by mouth daily. 09/25/17  Yes Charlott Rakes, MD  nitroGLYCERIN (NITROSTAT) 0.4 MG SL tablet Place 0.4 mg under the tongue every 5 (five) minutes as needed for chest pain. X 3 doses   Yes [provider]  VENTOLIN HFA 108 (90 Base) MCG/ACT inhaler INHALE 2 PUFFS INTO THE LUNGS EVERY 6 HOURS AS NEEDED FOR WHEEZING OR SHORTNESS OF BREATH. 11/08/17  Yes Newlin, Enobong, MD  clonazePAM (KLONOPIN) 0.5 MG tablet Take 0.5 tablets (0.25 mg total) by mouth 2 (two) times daily as needed for anxiety. Use sparingly 12/11/17   Argentina Donovan, PA-C  sertraline (ZOLOFT) 50 MG tablet Take 1/2 tablet daily X 1 week then 1 daily 12/11/17   Argentina Donovan, PA-C  varenicline ([place]  PAK) 1 MG tablet Take 1 tablet (1 mg total) by mouth 2 (two) times daily. Patient not taking: Reported on 09/25/2017 02/26/17   Charlott Rakes, MD  varenicline (CHANTIX STARTING MONTH PAK) 0.5 MG X 11 & 1 MG X 42 tablet Take one 0.5 mg tablet by mouth once daily for 3 days, then increase to one 0.5 mg tablet twice daily for 4 days, then increase to one 1 mg tablet twice daily. Patient not taking: Reported on 09/25/2017 02/26/17   Charlott Rakes, MD     Objective:  EXAM:   Vitals:   12/11/17 1338  BP: 129/78  Pulse: 87  Resp: 18  Temp: 98.2 F (36.8 C)  TempSrc: Oral  SpO2:  98%  Weight: 167 lb (75.8 kg)  Height: 5\' 4"  (1.626 m)    General appearance : A&OX3. NAD. Non-toxic-appearing HEENT: Atraumatic and Normocephalic.  PERRLA. EOM intact.   Neck: supple, no JVD. No cervical lymphadenopathy. No thyromegaly Chest/Lungs:  Breathing-non-labored, Good air entry bilaterally, breath sounds normal without rales, rhonchi, or wheezing  CVS: S1 S2 regular, no murmurs, gallops, rubs  Extremities: Bilateral Lower Ext shows no edema, both legs are warm to touch with = pulse throughout Neurology:  CN II-XII grossly intact, Non focal.   Psych:  TP linear. J/I WNL. Normal speech. Appropriate eye contact and affect.  Skin:  No Rash  Data Review Lab Results  Component Value Date   HGBA1C 5.8 (H) 11/27/2015     Assessment & Plan   1. Depression, unspecified depression type Multiple life stressors including financial/no job.  Declined meeting with Education officer, [employer].  Advised and counseled at length face to face 30 mins on self-care, exercise, drinking more water, rest, smoking cessation - sertraline (ZOLOFT) 50 MG tablet; Take 1/2 tablet daily X 1 week then 1 daily  Dispense: 30 tablet; Refill: 3  2. Anxiety - TSH - Vitamin D, 25-hydroxy - sertraline (ZOLOFT) 50 MG tablet; Take 1/2 tablet daily X 1 week then 1 daily  Dispense: 30 tablet; Refill: 3 - clonazePAM (KLONOPIN) 0.5 MG tablet; Take 0.5 tablets (0.25 mg total) by mouth 2 (two) times daily as needed for anxiety. Use sparingly  Dispense: 15 tablet; Refill: 0 I explained that the clonazepam would not be for ongoing use but only sparingly while the zoloft takes effect.    3. Tobacco dependence Smoking cessation counseling and information given  4. Essential hypertension Controlled-continue current regimen   Patient have been counseled extensively about nutrition and exercise  Return in about 4 weeks (around 01/08/2018) for Dr Newlin-anxiety/depression and menopausal symptoms.  The patient was given clear  instructions to go to ER or return to medical center if symptoms don't improve, worsen or new problems develop. The patient verbalized understanding. The patient was told to call to get lab results if they haven't heard anything in the next week.     Freeman Caldron, PA-C [facility] [place], [place]   12/11/2017, 2:06 PM

## 2017-12-11 NOTE — Patient Instructions (Signed)
Stress and Stress Management Stress is a normal reaction to life events. It is what you feel when life demands more than you are used to or more than you can handle. Some stress can be useful. For example, the stress reaction can help you catch the last bus of the day, study for a test, or meet a deadline at work. But stress that occurs too often or for too long can cause problems. It can affect your emotional health and interfere with relationships and normal daily activities. Too much stress can weaken your immune system and increase your risk for physical illness. If you already have a medical problem, stress can make it worse. What are the causes? All sorts of life events may cause stress. An event that causes stress for one person may not be stressful for another person. Major life events commonly cause stress. These may be positive or negative. Examples include losing your job, moving into a new home, getting married, having a baby, or losing a loved one. Less obvious life events may also cause stress, especially if they occur day after day or in combination. Examples include working long hours, driving in traffic, caring for children, being in debt, or being in a difficult relationship. What are the signs or symptoms? Stress may cause emotional symptoms including, the following:  Anxiety. This is feeling worried, afraid, on edge, overwhelmed, or out of control.  Anger. This is feeling irritated or impatient.  Depression. This is feeling sad, down, helpless, or guilty.  Difficulty focusing, remembering, or making decisions.  Stress may cause physical symptoms, including the following:  Aches and pains. These may affect your head, neck, back, stomach, or other areas of your body.  Tight muscles or clenched jaw.  Low energy or trouble sleeping.  Stress may cause unhealthy behaviors, including the following:  Eating to feel better (overeating) or skipping meals.  Sleeping too  little, too much, or both.  Working too much or putting off tasks (procrastination).  Smoking, drinking alcohol, or using drugs to feel better.  How is this diagnosed? Stress is diagnosed through an assessment by your health care provider. Your health care provider will ask questions about your symptoms and any stressful life events.Your health care provider will also ask about your medical history and may order blood tests or other tests. Certain medical conditions and medicine can cause physical symptoms similar to stress. Mental illness can cause emotional symptoms and unhealthy behaviors similar to stress. Your health care provider may refer you to a mental health professional for further evaluation. How is this treated? Stress management is the recommended treatment for stress.The goals of stress management are reducing stressful life events and coping with stress in healthy ways. Techniques for reducing stressful life events include the following:  Stress identification. Self-monitor for stress and identify what causes stress for you. These skills may help you to avoid some stressful events.  Time management. Set your priorities, keep a calendar of events, and learn to say "no." These tools can help you avoid making too many commitments.  Techniques for coping with stress include the following:  Rethinking the problem. Try to think realistically about stressful events rather than ignoring them or overreacting. Try to find the positives in a stressful situation rather than focusing on the negatives.  Exercise. Physical exercise can release both physical and emotional tension. The key is to find a form of exercise you enjoy and do it regularly.  Relaxation techniques. These relax the  body and mind. Examples include yoga, meditation, tai chi, biofeedback, deep breathing, progressive muscle relaxation, listening to music, being out in nature, journaling, and other hobbies. Again, the key is  to find one or more that you enjoy and can do regularly.  Healthy lifestyle. Eat a balanced diet, get plenty of sleep, and do not smoke. Avoid using alcohol or drugs to relax.  Strong support network. Spend time with family, friends, or other people you enjoy being around.Express your feelings and talk things over with someone you trust.  Counseling or talktherapy with a mental health professional may be helpful if you are having difficulty managing stress on your own. Medicine is typically not recommended for the treatment of stress.Talk to your health care provider if you think you need medicine for symptoms of stress. Follow these instructions at home:  Keep all follow-up visits as directed by your health care provider.  Take all medicines as directed by your health care provider. Contact a health care provider if:  Your symptoms get worse or you start having new symptoms.  You feel overwhelmed by your problems and can no longer manage them on your own. Get help right away if:  You feel like hurting yourself or someone else. This information is not intended to replace advice given to you by your health care provider. Make sure you discuss any questions you have with your health care provider. Document Released: 09/19/2000 Document Revised: 09/01/2015 Document Reviewed: 11/18/2012 Elsevier Interactive Patient Education  2017 [place]. Tobacco Use Disorder Tobacco use disorder (TUD) is a mental disorder. It is the long-term use of tobacco in spite of related health problems or difficulty with normal life activities. Tobacco is most commonly smoked as cigarettes and less commonly as cigars or pipes. Smokeless chewing tobacco and snuff are also popular. People with TUD get a feeling of extreme pleasure (euphoria) from using tobacco and have a desire to use it again and again. Repeated use of tobacco can cause problems. The addictive effects of tobacco are due mainly tothe ingredient  nicotine. Nicotine also causes a rush of adrenaline (epinephrine) in the body. This leads to increased blood pressure, heart rate, and breathing rate. These changes may cause problems for people with high blood pressure, weak hearts, or lung disease. High doses of nicotine in children and pets can lead to seizures and death. Tobacco contains a number of other unsafe chemicals. These chemicals are especially harmful when inhaled as smoke and can damage almost every organ in the body. Smokers live shorter lives than nonsmokers and are at risk of dying from a number of diseases and cancers. Tobacco smoke can also cause health problems for nonsmokers (due to inhaling secondhand smoke). Smoking is also a fire hazard. TUD usually starts in the late teenage years and is most common in young adults between the ages of 46 and 72 years. People who start smoking earlier in life are more likely to continue smoking as adults. TUD is somewhat more common in men than women. People with TUD are at higher risk for using alcohol and other drugs of abuse. What increases the risk? Risk factors for TUD include:  Having family members with the disorder.  Being around people who use tobacco.  Having an existing mental health issue such as schizophrenia, depression, bipolar disorder, ADHD, or posttraumatic stress disorder (PTSD).  What are the signs or symptoms? People with tobacco use disorder have two or more of the following signs and symptoms within 12 months:  Use of more tobacco over a longer period than intended.  Not able to cut down or control tobacco use.  A lot of time spent obtaining or using tobacco.  Strong desire or urge to use tobacco (craving). Cravings may last for 6 months or longer after quitting.  Use of tobacco even when use leads to major problems at work, school, or home.  Use of tobacco even when use leads to relationship problems.  Giving up or cutting down on important life activities  because of tobacco use.  Repeatedly using tobacco in situations where it puts you or others in physical danger, like smoking in bed.  Use of tobacco even when it is known that a physical or mental problem is likely related to tobacco use. ? Physical problems are numerous and may include chronic bronchitis, emphysema, lung and other cancers, gum disease, high blood pressure, heart disease, and stroke. ? Mental problems caused by tobacco may include difficulty sleeping and anxiety.  Need to use greater amounts of tobacco to get the same effect. This means you have developed a tolerance.  Withdrawal symptoms as a result of stopping or rapidly cutting back use. These symptoms may last a month or more after quitting and include the following: ? Depressed, anxious, or irritable mood. ? Difficulty concentrating. ? Increased appetite. ? Restlessness or trouble sleeping. ? Use of tobacco to avoid withdrawal symptoms.  How is this diagnosed? Tobacco use disorder is diagnosed by your health care provider. A diagnosis may be made by:  Your health care provider asking questions about your tobacco use and any problems it may be causing.  A physical exam.  Lab tests.  You may be referred to a mental health professional or addiction specialist.  The severity of tobacco use disorder depends on the number of signs and symptoms you have:  Mild-Two or three symptoms.  Moderate-Four or five symptoms.  Severe-Six or more symptoms.  How is this treated? Many people with tobacco use disorder are unable to quit on their own and need help. Treatment options include the following:  Nicotine replacement therapy (NRT). NRT provides nicotine without the other harmful chemicals in tobacco. NRT gradually lowers the dosage of nicotine in the body and reduces withdrawal symptoms. NRT is available in over-the-counter forms (gum, lozenges, and skin patches) as well as prescription forms (mouth inhaler and nasal  spray).  Medicines.This may include: ? Antidepressant medicine that may reduce nicotine cravings. ? A medicine that acts on nicotine receptors in the brain to reduce cravings and withdrawal symptoms. It may also block the effects of tobacco in people with TUD who relapse.  Counseling or talk therapy. A form of talk therapy called behavioral therapy is commonly used to treat people with TUD. Behavioral therapy looks at triggers for tobacco use, how to avoid them, and how to cope with cravings. It is most effective in person or by phone but is also available in self-help forms (books and Internet websites).  Support groups. These provide emotional support, advice, and guidance for quitting tobacco.  The most effective treatment for TUD is usually a combination of medicine, talk therapy, and support groups. Follow these instructions at home:  Keep all follow-up visits as directed by your health care provider. This is important.  Take medicines only as directed by your health care provider.  Check with your health care provider before starting new prescription or over-the-counter medicines. Contact a health care provider if:  You are not able to take your medicines  as prescribed.  Treatment is not helping your TUD and your symptoms get worse. Get help right away if:  You have serious thoughts about hurting yourself or others.  You have trouble breathing, chest pain, sudden weakness, or sudden numbness in part of your body. This information is not intended to replace advice given to you by your health care provider. Make sure you discuss any questions you have with your health care provider. Document Released: 11/30/2003 Document Revised: 11/27/2015 Document Reviewed: 05/22/2013 Elsevier Interactive Patient Education  [employer].

## 2017-12-12 ENCOUNTER — Telehealth: Payer: Self-pay | Admitting: *Deleted

## 2017-12-12 ENCOUNTER — Other Ambulatory Visit: Payer: Self-pay | Admitting: Physician Assistant

## 2017-12-12 LAB — TSH: TSH: 0.648 u[IU]/mL (ref 0.450–4.500)

## 2017-12-12 LAB — VITAMIN D 25 HYDROXY (VIT D DEFICIENCY, FRACTURES): Vit D, 25-Hydroxy: 14.7 ng/mL — ABNORMAL LOW (ref 30.0–100.0)

## 2017-12-12 MED ORDER — VITAMIN D (ERGOCALCIFEROL) 1.25 MG (50000 UNIT) PO CAPS: 50000.0000 [IU] | ORAL_CAPSULE | ORAL | 0 refills | Status: DC

## 2017-12-12 MED FILL — VIT D2 1.25 MG (50,000 UNIT: 1.25 MG | 28 days supply | Qty: 4 | Fill #0

## 2017-12-12 NOTE — Telephone Encounter (Signed)
-----   Message from Argentina Donovan, [place] sent at 12/12/2017  9:00 AM EDT ----- Your vitamin D is low.  This can contribute to muscle aches, anxiety, fatigue, and depression.  I have sent a prescription to the pharmacy for you to take once a week.  We will recheck this level in 3-4 months.  Your thyroid hormone is normal.  Follow-up as planned.  Thanks, Freeman Caldron, PA-C

## 2017-12-12 NOTE — Telephone Encounter (Signed)
Patient verified DOB Patient is aware of vitamin d level being low and a prescription being sent to the [facility] pharmacy to take once a week. Patient states she will pick up prescription next week.

## 2018-01-08 MED FILL — ?CARVEDILOL 3.125 MG TABLET: 3.125 | 60 days supply | Qty: 60 | Fill #1

## 2018-01-08 MED FILL — ?CLOPIDOGREL 75MG TA: 75 | 30 days supply | Qty: 30 | Fill #1

## 2018-01-08 MED FILL — NITROSTAT 0.4 MG TABLET SL: 0.4 | 25 days supply | Qty: 25 | Fill #0

## 2018-01-08 MED FILL — LISINOPRIL 5 MG TAB: 5 | 30 days supply | Qty: 30 | Fill #3

## 2018-01-08 MED FILL — GABAPENTIN 300 MG CAPSULE: 300 | 30 days supply | Qty: 90 | Fill #3

## 2018-01-08 MED FILL — ?ATORVASTATIN 40MG TABLET: 40 | 30 days supply | Qty: 30 | Fill #1

## 2018-02-11 ENCOUNTER — Ambulatory Visit: Payer: Self-pay | Attending: Family Medicine | Admitting: Family Medicine

## 2018-02-11 ENCOUNTER — Encounter: Payer: Self-pay | Admitting: Family Medicine

## 2018-02-11 VITALS — BP 109/72 | HR 82 | Temp 98.1°F | Resp 18 | Ht 64.0 in | Wt 166.0 lb

## 2018-02-11 DIAGNOSIS — Z1211 Encounter for screening for malignant neoplasm of colon: Secondary | ICD-10-CM

## 2018-02-11 DIAGNOSIS — I10 Essential (primary) hypertension: Secondary | ICD-10-CM | POA: Insufficient documentation

## 2018-02-11 DIAGNOSIS — I739 Peripheral vascular disease, unspecified: Secondary | ICD-10-CM | POA: Insufficient documentation

## 2018-02-11 DIAGNOSIS — R232 Flushing: Secondary | ICD-10-CM | POA: Insufficient documentation

## 2018-02-11 DIAGNOSIS — I2510 Atherosclerotic heart disease of native coronary artery without angina pectoris: Secondary | ICD-10-CM | POA: Insufficient documentation

## 2018-02-11 DIAGNOSIS — Z955 Presence of coronary angioplasty implant and graft: Secondary | ICD-10-CM | POA: Insufficient documentation

## 2018-02-11 DIAGNOSIS — F17200 Nicotine dependence, unspecified, uncomplicated: Secondary | ICD-10-CM | POA: Insufficient documentation

## 2018-02-11 DIAGNOSIS — Z885 Allergy status to narcotic agent status: Secondary | ICD-10-CM | POA: Insufficient documentation

## 2018-02-11 DIAGNOSIS — Z7982 Long term (current) use of aspirin: Secondary | ICD-10-CM | POA: Insufficient documentation

## 2018-02-11 DIAGNOSIS — R202 Paresthesia of skin: Secondary | ICD-10-CM | POA: Insufficient documentation

## 2018-02-11 DIAGNOSIS — N951 Menopausal and female climacteric states: Secondary | ICD-10-CM

## 2018-02-11 DIAGNOSIS — Z9861 Coronary angioplasty status: Secondary | ICD-10-CM

## 2018-02-11 DIAGNOSIS — F329 Major depressive disorder, single episode, unspecified: Secondary | ICD-10-CM | POA: Insufficient documentation

## 2018-02-11 DIAGNOSIS — M722 Plantar fascial fibromatosis: Secondary | ICD-10-CM | POA: Insufficient documentation

## 2018-02-11 DIAGNOSIS — Z79899 Other long term (current) drug therapy: Secondary | ICD-10-CM | POA: Insufficient documentation

## 2018-02-11 DIAGNOSIS — F419 Anxiety disorder, unspecified: Secondary | ICD-10-CM | POA: Insufficient documentation

## 2018-02-11 DIAGNOSIS — K648 Other hemorrhoids: Secondary | ICD-10-CM | POA: Insufficient documentation

## 2018-02-11 DIAGNOSIS — M62838 Other muscle spasm: Secondary | ICD-10-CM | POA: Insufficient documentation

## 2018-02-11 DIAGNOSIS — Z7902 Long term (current) use of antithrombotics/antiplatelets: Secondary | ICD-10-CM | POA: Insufficient documentation

## 2018-02-11 DIAGNOSIS — E559 Vitamin D deficiency, unspecified: Secondary | ICD-10-CM | POA: Insufficient documentation

## 2018-02-11 DIAGNOSIS — E785 Hyperlipidemia, unspecified: Secondary | ICD-10-CM | POA: Insufficient documentation

## 2018-02-11 MED ORDER — GABAPENTIN 300 MG PO CAPS: 900.0000 mg | ORAL_CAPSULE | Freq: Every day | ORAL | 6 refills | Status: DC

## 2018-02-11 MED ORDER — LISINOPRIL 5 MG PO TABS: 5.0000 mg | ORAL_TABLET | Freq: Every day | ORAL | 6 refills | Status: DC

## 2018-02-11 MED ORDER — VITAMIN D (ERGOCALCIFEROL) 1.25 MG (50000 UNIT) PO CAPS: 50000.0000 [IU] | ORAL_CAPSULE | ORAL | 0 refills | Status: DC

## 2018-02-11 MED ORDER — CARVEDILOL 3.125 MG PO TABS: 3.1250 mg | ORAL_TABLET | Freq: Every day | ORAL | 6 refills | Status: DC

## 2018-02-11 MED ORDER — TIZANIDINE HCL 4 MG PO TABS: 4.0000 mg | ORAL_TABLET | Freq: Four times a day (QID) | ORAL | 0 refills | Status: DC | PRN

## 2018-02-11 MED ORDER — ATORVASTATIN CALCIUM 40 MG PO TABS: ORAL_TABLET | ORAL | 6 refills | Status: DC

## 2018-02-11 MED ORDER — PREDNISONE 20 MG PO TABS: 20.0000 mg | ORAL_TABLET | Freq: Every day | ORAL | 0 refills | Status: DC

## 2018-02-11 MED FILL — ?CLOPIDOGREL 75MG TA: 75 | 30 days supply | Qty: 30 | Fill #2

## 2018-02-11 MED FILL — ?CARVEDILOL 3.125 MG TABLET: 3.125 | 30 days supply | Qty: 30 | Fill #0

## 2018-02-11 MED FILL — ?ATORVASTATIN 40MG TABLET: 40 | 30 days supply | Qty: 30 | Fill #2

## 2018-02-11 MED FILL — VIT D2 1.25 MG (50,000 UNIT: 1.25 MG | 28 days supply | Qty: 4 | Fill #0

## 2018-02-11 MED FILL — LISINOPRIL 5 MG TABLET: 5 | 30 days supply | Qty: 30 | Fill #4

## 2018-02-11 MED FILL — predniSONE 20 MG TABS: 20 | 5 days supply | Qty: 5 | Fill #0

## 2018-02-11 MED FILL — GABAPENTIN 300 MG CAPSULE: 300 | 30 days supply | Qty: 90 | Fill #4

## 2018-02-11 MED FILL — tiZANidine HCL 4 MG TABS: 4 | 7 days supply | Qty: 30 | Fill #0

## 2018-02-11 NOTE — Progress Notes (Signed)
Subjective:  Patient ID: Robin Walsh, female    DOB: [birth year]  Age: 52 y.o. MRN: [id number]  CC: Follow-up   HPI Robin Walsh is a 52 year old female with a history of tobacco abuse, coronary artery disease (status post PCI and drug-eluting stent placement to LAD in 11/2015) peripheral arterial disease (status post stenting of bilateral common iliac artery) who presents today for follow-up visit. She complains of posterior neck pain and right flank pain which has been intermittent but denies urinary symptoms.  Symptoms have been present for the last month and she denies heavy lifting.  He has also noticed intermittent posterior neck pain. She had a visit with the PA for anxiety and depression two months ago at which time she was commenced on Zoloft and given a short-term prescription of Klonopin but she discontinued Zoloft due to blurry vision.  Her anxiety and depression symptoms are now controlled as she has been able to secure a job.  States she was on nefazodone in the past for treatment of depression and did well on it she has been working for the last month and feels better.  Declines medications at this time. She has low energy but states the vitamin D capsules have been beneficial. She has noticed hyperesthesia of the lateral aspect of her right index finger up to the lower half of the medial aspect of her right middle finger ever since she attempted a cartwheel and could not balance properly in her right wrist.  She denies pain and hyperesthesia is only felt when she touches her finger but absent at rest.  She does have intermittent claudication bilaterally and is followed by vascular.  She continues to smoke which does not help matters and informs me that everyone around her smokes.  She has been compliant with her Plavix. She denies chest pains or shortness of breath.  Past Medical History:  Diagnosis Date  . CAD (coronary artery disease)   . Hyperlipidemia   . Hypertension       Past Surgical History:  Procedure Laterality Date  . CARDIAC CATHETERIZATION N/A 11/28/2015   Procedure: Left Heart Cath and Coronary Angiography;  Surgeon: Jettie Booze, MD;  Location: [place] [facility];  Service: Cardiovascular;  Laterality: N/A;  . CARDIAC CATHETERIZATION N/A 11/28/2015   Procedure: Coronary Stent Intervention;  Surgeon: Jettie Booze, MD;  Location: [place] [facility];  Service: Cardiovascular;  Laterality: N/A;  DES to Mid LAD; 3.0x12 Synergy  . LEG SURGERY  2002?   tibial plateau fracture  . PERIPHERAL VASCULAR CATHETERIZATION N/A 01/04/2016   Procedure: Abdominal Aortogram;  Surgeon: Nelva Bush, MD;  Location: [place] [facility];  Service: Cardiovascular;  Laterality: N/A;  . PERIPHERAL VASCULAR CATHETERIZATION Bilateral 01/04/2016   Procedure: Lower Extremity Angiography;  Surgeon: Nelva Bush, MD;  Location: [place] [facility];  Service: Cardiovascular;  Laterality: Bilateral;  . PERIPHERAL VASCULAR CATHETERIZATION Bilateral 01/04/2016   Procedure: Peripheral Vascular Intervention;  Surgeon: Nelva Bush, MD;  Location: [place] [facility];  Service: Cardiovascular;  Laterality: Bilateral;  common iliac    Allergies  Allergen Reactions  . Codeine Nausea And Vomiting  . Dilaudid [Hydromorphone Hcl] Nausea And Vomiting     Outpatient Medications Prior to Visit  Medication Sig Dispense Refill  . aspirin 81 MG chewable tablet Chew 1 tablet (81 mg total) by mouth daily. 30 tablet 11  . cetirizine (ZYRTEC) 10 MG tablet Take 1 tablet (10 mg total) by mouth daily. 30 tablet 1  .  clopidogrel (PLAVIX) 75 MG tablet Take 1 tablet (75 mg total) by mouth daily with breakfast. 30 tablet 3  . diclofenac sodium (VOLTAREN) 1 % GEL Apply 4 g topically 4 (four) times daily. 100 g 1  . nitroGLYCERIN (NITROSTAT) 0.4 MG SL tablet Place 1 tablet (0.4 mg total) under the tongue every 5 (five) minutes as needed for chest pain. X 3 doses 25 tablet 1  . VENTOLIN  HFA 108 (90 Base) MCG/ACT inhaler INHALE 2 PUFFS INTO THE LUNGS EVERY 6 HOURS AS NEEDED FOR WHEEZING OR SHORTNESS OF BREATH. 18 g 2  . atorvastatin (LIPITOR) 40 MG tablet TAKE 1 TABLET BY MOUTH DAILY AT 6 PM. 30 tablet 2  . carvedilol (COREG) 3.125 MG tablet Take 1 tablet (3.125 mg total) by mouth daily. 60 tablet 6  . clonazePAM (KLONOPIN) 0.5 MG tablet Take 0.5 tablets (0.25 mg total) by mouth 2 (two) times daily as needed for anxiety. Use sparingly 15 tablet 0  . gabapentin (NEURONTIN) 300 MG capsule Take 3 capsules (900 mg total) by mouth at bedtime. 90 capsule 6  . lisinopril (PRINIVIL,ZESTRIL) 5 MG tablet Take 1 tablet (5 mg total) by mouth daily. 30 tablet 6  . Vitamin D, Ergocalciferol, (DRISDOL) 50000 units CAPS capsule Take 1 capsule (50,000 Units total) by mouth every 7 (seven) days. 16 capsule 0  . sertraline (ZOLOFT) 50 MG tablet Take 1/2 tablet daily X 1 week then 1 daily (Patient not taking: Reported on 02/11/2018) 30 tablet 3  . varenicline (CHANTIX CONTINUING MONTH PAK) 1 MG tablet Take 1 tablet (1 mg total) by mouth 2 (two) times daily. (Patient not taking: Reported on 09/25/2017) 60 tablet 1  . varenicline (CHANTIX STARTING MONTH PAK) 0.5 MG X 11 & 1 MG X 42 tablet Take one 0.5 mg tablet by mouth once daily for 3 days, then increase to one 0.5 mg tablet twice daily for 4 days, then increase to one 1 mg tablet twice daily. (Patient not taking: Reported on 09/25/2017) 53 tablet 0   No facility-administered medications prior to visit.     ROS Review of Systems  Constitutional: Positive for fatigue. Negative for activity change and appetite change.  HENT: Negative for congestion, sinus pressure and sore throat.   Eyes: Negative for visual disturbance.  Respiratory: Negative for cough, chest tightness, shortness of breath and wheezing.   Cardiovascular: Negative for chest pain and palpitations.  Gastrointestinal: Negative for abdominal distention, abdominal pain and constipation.    Endocrine: Negative for polydipsia.  Genitourinary: Negative for dysuria and frequency.  Musculoskeletal: Negative for arthralgias and back pain.  Skin: Negative for rash.  Neurological: Negative for tremors, light-headedness and numbness.  Hematological: Does not bruise/bleed easily.  Psychiatric/Behavioral: Negative for agitation and behavioral problems.    Objective:  BP 109/72 (BP Location: Left Arm, Patient Position: Sitting, Cuff Size: Normal)   Pulse 82   Temp 98.1 F (36.7 C) (Oral)   Resp 18   Ht 5\' 4"  (1.626 m)   Wt 166 lb (75.3 kg)   SpO2 99%   BMI 28.49 kg/m   BP/Weight 02/11/2018 12/11/2017 9/76/7341  Systolic BP 937 902 409  Diastolic BP 72 78 82  Wt. (Lbs) 166 167 167  BMI 28.49 28.67 28.67      Physical Exam  Constitutional: She is oriented to person, place, and time. She appears well-developed and well-nourished.  Neck: No JVD present.  Cardiovascular: Normal rate, normal heart sounds and intact distal pulses.  No murmur heard. Pulmonary/Chest: Effort  normal and breath sounds normal. She has no wheezes. She has no rales. She exhibits no tenderness.  Abdominal: Soft. Bowel sounds are normal. She exhibits no distension and no mass. There is no tenderness.  Musculoskeletal: Normal range of motion.  Neurological: She is alert and oriented to person, place, and time.  Skin: Skin is warm and dry.  Psychiatric: She has a normal mood and affect.     Assessment & Plan:   1. Other hemorrhoids Continue with sitz bath Currently using Preparation H - CBC with Differential/Platelet  2. Muscle spasm This explains the neck pain she is having and the right flank pain Advised to apply heat - tiZANidine (ZANAFLEX) 4 MG tablet; Take 1 tablet (4 mg total) by mouth every 6 (six) hours as needed for muscle spasms.  Dispense: 30 tablet; Refill: 0  3. Essential hypertension Controlled Counseled on blood pressure goal of less than 130/80, low-sodium, DASH diet,  medication compliance, 150 minutes of moderate intensity exercise per week. Discussed medication compliance, adverse effects. - lisinopril (PRINIVIL,ZESTRIL) 5 MG tablet; Take 1 tablet (5 mg total) by mouth daily.  Dispense: 30 tablet; Refill: 6 - carvedilol (COREG) 3.125 MG tablet; Take 1 tablet (3.125 mg total) by mouth daily.  Dispense: 60 tablet; Refill: 6  4. Hot flash, menopausal - gabapentin (NEURONTIN) 300 MG capsule; Take 3 capsules (900 mg total) by mouth at bedtime.  Dispense: 90 capsule; Refill: 6  5. CAD S/P DES PCI TO mLAD - Synergy DES 3.0 mm x 12 mm Risk factor modification; unfortunately she continues to smoke and has been advised that this is detrimental Asymptomatic at this time - atorvastatin (LIPITOR) 40 MG tablet; TAKE 1 TABLET BY MOUTH DAILY AT 6 PM.  Dispense: 30 tablet; Refill: 6  6. Plantar fasciitis Does have Voltaren gel which she is using Demonstrated stretching exercises Use insoles Hopefully short course of prednisone should be beneficial  7. Screening for colon cancer - Ambulatory referral to Gastroenterology  8. Tobacco dependence Smoking cessation support: smoking cessation hotline: 1-800-QUIT-NOW.  Smoking cessation classes are available through [facility] and [facility]. Call [phone] or visit our website at [email or web address]  Spent 3 minutes counseling on dangers of tobacco use and benefits of quitting and patient is not ready to quit.   9. Vitamin D deficiency - Vitamin D, Ergocalciferol, (DRISDOL) 50000 units CAPS capsule; Take 1 capsule (50,000 Units total) by mouth every 7 (seven) days.  Dispense: 16 capsule; Refill: 0  10. Anxiety and depression Previously brought upon by the fact that she was unemployed but now has secured a job Controlled Currently uninterested in medications/she is coping well  11. 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  Dispense: 5 tablet; Refill: 0   Meds ordered this encounter  Medications  . Vitamin D, Ergocalciferol, (DRISDOL) 50000 units CAPS capsule    Sig: Take 1 capsule (50,000 Units total) by mouth every 7 (seven) days.    Dispense:  16 capsule    Refill:  0  . predniSONE (DELTASONE) 20 MG tablet    Sig: Take 1 tablet (20 mg total) by mouth daily with breakfast.    Dispense:  5 tablet    Refill:  0  . tiZANidine (ZANAFLEX) 4 MG tablet    Sig: Take 1 tablet (4 mg total) by mouth every 6 (six) hours as needed for muscle spasms.  Dispense:  30 tablet    Refill:  0  . lisinopril (PRINIVIL,ZESTRIL) 5 MG tablet    Sig: Take 1 tablet (5 mg total) by mouth daily.    Dispense:  30 tablet    Refill:  6  . gabapentin (NEURONTIN) 300 MG capsule    Sig: Take 3 capsules (900 mg total) by mouth at bedtime.    Dispense:  90 capsule    Refill:  6  . carvedilol (COREG) 3.125 MG tablet    Sig: Take 1 tablet (3.125 mg total) by mouth daily.    Dispense:  60 tablet    Refill:  6  . atorvastatin (LIPITOR) 40 MG tablet    Sig: TAKE 1 TABLET BY MOUTH DAILY AT 6 PM.    Dispense:  30 tablet    Refill:  6    Follow-up: Return in about 3 months (around 05/14/2018) for Follow-up of chronic medical conditions.   Charlott Rakes MD

## 2018-02-11 NOTE — Patient Instructions (Signed)

## 2018-02-12 LAB — CBC WITH DIFFERENTIAL/PLATELET
BASOS ABS: 0 10*3/uL (ref 0.0–0.2)
Basos: 0 %
EOS (ABSOLUTE): 0.5 10*3/uL — ABNORMAL HIGH (ref 0.0–0.4)
Eos: 5 %
HEMOGLOBIN: 12.8 g/dL (ref 11.1–15.9)
Hematocrit: 38.1 % (ref 34.0–46.6)
IMMATURE GRANS (ABS): 0 10*3/uL (ref 0.0–0.1)
IMMATURE GRANULOCYTES: 0 %
LYMPHS: 33 %
Lymphocytes Absolute: 3.3 10*3/uL — ABNORMAL HIGH (ref 0.7–3.1)
MCH: 31.3 pg (ref 26.6–33.0)
MCHC: 33.6 g/dL (ref 31.5–35.7)
MCV: 93 fL (ref 79–97)
MONOCYTES: 5 %
Monocytes Absolute: 0.5 10*3/uL (ref 0.1–0.9)
NEUTROS PCT: 57 %
Neutrophils Absolute: 5.6 10*3/uL (ref 1.4–7.0)
PLATELETS: 340 10*3/uL (ref 150–450)
RBC: 4.09 x10E6/uL (ref 3.77–5.28)
RDW: 11.8 % — ABNORMAL LOW (ref 12.3–15.4)
WBC: 10.1 10*3/uL (ref 3.4–10.8)

## 2018-02-21 ENCOUNTER — Encounter: Payer: Self-pay | Admitting: Family Medicine

## 2018-02-24 ENCOUNTER — Encounter: Payer: Self-pay | Admitting: Gastroenterology

## 2018-03-11 ENCOUNTER — Encounter: Payer: Self-pay | Admitting: Cardiovascular Disease

## 2018-03-11 ENCOUNTER — Ambulatory Visit (INDEPENDENT_AMBULATORY_CARE_PROVIDER_SITE_OTHER): Payer: No Typology Code available for payment source | Admitting: Cardiovascular Disease

## 2018-03-11 VITALS — BP 119/76 | HR 86 | Ht 64.0 in | Wt 170.2 lb

## 2018-03-11 DIAGNOSIS — E785 Hyperlipidemia, unspecified: Secondary | ICD-10-CM

## 2018-03-11 DIAGNOSIS — I2510 Atherosclerotic heart disease of native coronary artery without angina pectoris: Secondary | ICD-10-CM

## 2018-03-11 DIAGNOSIS — I739 Peripheral vascular disease, unspecified: Secondary | ICD-10-CM

## 2018-03-11 DIAGNOSIS — Z720 Tobacco use: Secondary | ICD-10-CM

## 2018-03-11 NOTE — Progress Notes (Signed)
Cardiology Office Note   Date:  03/11/2018   ID:  [name], DOB Oct 05, 1965, MRN [id number]  PCP:  Charlott Rakes, MD  Cardiologist:  Dr. Marlou Porch  Chief Complaint  Patient presents with  . Follow-up    pt dnied chest pain and SOB      History of Present Illness: Robin Walsh is a 52 y.o. female who presents for a follow-up visit regarding  peripheral arterial disease. She has known history of coronary artery disease, hypertension, hyperlipidemia and tobacco use. She has known history of coronary artery disease status post LAD PCI in 2017. She is status post bilateral common iliac artery kissing stent placement in 12/2015 for severe claudication. She had no significant infrainguinal disease. She has recurrent claudication due to significant in-stent restenosis and bilateral common iliac arteries. She reports that her symptoms are stable overall.  She is trying to be more active and exercise.  She continues to smoke half a pack per day.  She is frustrated with inability to lose weight.  Past Medical History:  Diagnosis Date  . CAD (coronary artery disease)   . Hyperlipidemia   . Hypertension     Past Surgical History:  Procedure Laterality Date  . CARDIAC CATHETERIZATION N/A 11/28/2015   Procedure: Left Heart Cath and Coronary Angiography;  Surgeon: Jettie Booze, MD;  Location: [place];  Service: Cardiovascular;  Laterality: N/A;  . CARDIAC CATHETERIZATION N/A 11/28/2015   Procedure: Coronary Stent Intervention;  Surgeon: Jettie Booze, MD;  Location: [place] [facility];  Service: Cardiovascular;  Laterality: N/A;  DES to Mid LAD; 3.0x12 Synergy  . LEG SURGERY  2002?   tibial plateau fracture  . PERIPHERAL VASCULAR CATHETERIZATION N/A 01/04/2016   Procedure: Abdominal Aortogram;  Surgeon: Nelva Bush, MD;  Location: [place] [facility];  Service: Cardiovascular;  Laterality: N/A;  . PERIPHERAL VASCULAR CATHETERIZATION Bilateral 01/04/2016   Procedure: Lower Extremity Angiography;  Surgeon: Nelva Bush, MD;  Location: [place] [facility];  Service: Cardiovascular;  Laterality: Bilateral;  . PERIPHERAL VASCULAR CATHETERIZATION Bilateral 01/04/2016   Procedure: Peripheral Vascular Intervention;  Surgeon: Nelva Bush, MD;  Location: [facility] [facility];  Service: Cardiovascular;  Laterality: Bilateral;  common iliac     Current Outpatient Medications  Medication Sig Dispense Refill  . aspirin 81 MG chewable tablet Chew 1 tablet (81 mg total) by mouth daily. 30 tablet 11  . atorvastatin (LIPITOR) 40 MG tablet TAKE 1 TABLET BY MOUTH DAILY AT 6 PM. 30 tablet 6  . carvedilol (COREG) 3.125 MG tablet Take 1 tablet (3.125 mg total) by mouth daily. 60 tablet 6  . cetirizine (ZYRTEC) 10 MG tablet Take 1 tablet (10 mg total) by mouth daily. 30 tablet 1  . clopidogrel (PLAVIX) 75 MG tablet Take 1 tablet (75 mg total) by mouth daily with breakfast. 30 tablet 3  . diclofenac sodium (VOLTAREN) 1 % GEL Apply 4 g topically 4 (four) times daily. 100 g 1  . gabapentin (NEURONTIN) 300 MG capsule Take 3 capsules (900 mg total) by mouth at bedtime. 90 capsule 6  . lisinopril (PRINIVIL,ZESTRIL) 5 MG tablet Take 1 tablet (5 mg total) by mouth daily. 30 tablet 6  . nitroGLYCERIN (NITROSTAT) 0.4 MG SL tablet Place 1 tablet (0.4 mg total) under the tongue every 5 (five) minutes as needed for chest pain. X 3 doses 25 tablet 1  . predniSONE (DELTASONE) 20 MG tablet Take 1 tablet (20 mg total) by mouth daily with  breakfast. 5 tablet 0  . tiZANidine (ZANAFLEX) 4 MG tablet Take 1 tablet (4 mg total) by mouth every 6 (six) hours as needed for muscle spasms. 30 tablet 0  . varenicline (CHANTIX CONTINUING MONTH PAK) 1 MG tablet Take 1 tablet (1 mg total) by mouth 2 (two) times daily. 60 tablet 1  . varenicline (CHANTIX STARTING MONTH PAK) 0.5 MG X 11 & 1 MG X 42 tablet Take one 0.5 mg tablet by mouth once daily for 3 days, then increase to one 0.5 mg tablet twice  daily for 4 days, then increase to one 1 mg tablet twice daily. 53 tablet 0  . VENTOLIN HFA 108 (90 Base) MCG/ACT inhaler INHALE 2 PUFFS INTO THE LUNGS EVERY 6 HOURS AS NEEDED FOR WHEEZING OR SHORTNESS OF BREATH. 18 g 2  . Vitamin D, Ergocalciferol, (DRISDOL) 50000 units CAPS capsule Take 1 capsule (50,000 Units total) by mouth every 7 (seven) days. 16 capsule 0   No current facility-administered medications for this visit.     Allergies:   Codeine and Dilaudid [hydromorphone hcl]    Social History:  The patient  reports that she has been smoking cigarettes. She has a 12.50 pack-year smoking history. She has never used smokeless tobacco. She reports that she has current or past drug history. Drug: Marijuana. She reports that she does not drink alcohol.   Family History:  The patient's family history includes Alcohol abuse (age of onset: 39) in her father; Congestive Heart Failure (age of onset: 64) in her mother; Heart disease in her brother; Liver disease in her sister.    ROS:  Please see the history of present illness.   Otherwise, review of systems are positive for none.   All other systems are reviewed and negative.    PHYSICAL EXAM: VS:  BP 119/76   Pulse 86   Ht 5\' 4"  (1.626 m)   Wt 170 lb 3.2 oz (77.2 kg)   BMI 29.21 kg/m  , BMI Body mass index is 29.21 kg/m. GEN: Well nourished, well developed, in no acute distress  HEENT: normal  Neck: no JVD, carotid bruits, or masses Cardiac: RRR; no murmurs, rubs, or gallops,no edema  Respiratory:  clear to auscultation bilaterally, normal work of breathing GI: soft, nontender, nondistended, + BS MS: no deformity or atrophy  Skin: warm and dry, no rash Neuro:  Strength and sensation are intact Psych: euthymic mood, full affect Vascular: Femoral pulses +1 bilaterally.  Distal pulses are barely palpable bilaterally.  EKG:  EKG is not ordered today.   Recent Labs: 09/26/2017: ALT 6; BUN 11; Creatinine, Ser 1.05; Potassium 4.9;  Sodium 140 12/11/2017: TSH 0.648 02/11/2018: Hemoglobin 12.8; Platelets 340    Lipid Panel    Component Value Date/Time   CHOL 145 09/26/2017 0957   TRIG 225 (H) 09/26/2017 0957   HDL 26 (L) 09/26/2017 0957   CHOLHDL 5.6 (H) 09/26/2017 0957   CHOLHDL 7.8 11/27/2015 0453   VLDL 66 (H) 11/27/2015 0453   LDLCALC 74 09/26/2017 0957      Wt Readings from Last 3 Encounters:  03/11/18 170 lb 3.2 oz (77.2 kg)  02/11/18 166 lb (75.3 kg)  12/11/17 167 lb (75.8 kg)       No flowsheet data found.    ASSESSMENT AND PLAN:  1.  Peripheral arterial disease: Stable bilateral leg claudication due to  in-stent restenosis in the common iliac arteries.    Her symptoms have been stable overall.  Given stability of her symptoms,  I think it reasonable to continue with medical therapy and clinical monitoring.  If symptoms worsen, we can proceed with repeat angiography with plans he is covered stents if feasible. I discussed with him the importance of a walking program.  2. Tobacco use: Unfortunately, she has not been able to quit smoking and currently smokes half a pack per day.  3. Coronary artery disease involving native coronary arteries with other forms of angina:   Continue medical therapy.  4. Hyperlipidemia: Continue treatment with atorvastatin with a target LDL of less than 70.   Disposition:   FU with me in 6 months  Signed,  [name], MD  03/11/2018 1:20 PM    [facility]

## 2018-03-11 NOTE — Patient Instructions (Signed)
Medication Instructions:  Your physician recommends that you continue on your current medications as directed. Please refer to the Current Medication list given to you today.  If you need a refill on your cardiac medications before your next appointment, please call your pharmacy.   Lab work: none If you have labs (blood work) drawn today and your tests are completely normal, you will receive your results only by: . MyChart Message (if you have MyChart) OR . A paper copy in the mail If you have any lab test that is abnormal or we need to change your treatment, we will call you to review the results.  Testing/Procedures: none  Follow-Up: At [facility], you and your health needs are our priority.  As part of our continuing mission to provide you with exceptional heart care, we have created designated Provider Care Teams.  These Care Teams include your primary Cardiologist (physician) and Advanced Practice Providers (APPs -  Physician Assistants and Nurse Practitioners) who all work together to provide you with the care you need, when you need it. You will need a follow up appointment in 6 months.  Please call our office 2 months in advance to schedule this appointment.  You may see Dr. Arida or one of the following Advanced Practice Providers on your designated Care Team:   Luke Kilroy, PA-C Krista Kroeger, PA-C . Callie Goodrich, PA-C  Any Other Special Instructions Will Be Listed Below (If Applicable).    

## 2018-03-13 MED FILL — ?ATORVASTATIN 40MG TABLET: 40 | 30 days supply | Qty: 30 | Fill #0

## 2018-03-13 MED FILL — CLOPIDOGREL 75 MG TABLET: 75 | 30 days supply | Qty: 30 | Fill #3

## 2018-03-13 MED FILL — VIT D2 1.25 MG (50,000 UNIT: 1.25 MG | 28 days supply | Qty: 4 | Fill #1

## 2018-03-13 MED FILL — LISINOPRIL 5 MG TAB: 5 | 30 days supply | Qty: 30 | Fill #5

## 2018-03-13 MED FILL — ?CARVEDILOL 3.125 MG TABLET: 3.125 | 60 days supply | Qty: 60 | Fill #2

## 2018-03-13 MED FILL — GABAPENTIN 300 MG CAPSULE: 300 | 30 days supply | Qty: 90 | Fill #5

## 2018-03-14 ENCOUNTER — Other Ambulatory Visit: Payer: Self-pay

## 2018-03-14 DIAGNOSIS — J40 Bronchitis, not specified as acute or chronic: Secondary | ICD-10-CM

## 2018-03-14 MED ORDER — ALBUTEROL SULFATE HFA 108 (90 BASE) MCG/ACT IN AERS: INHALATION_SPRAY | RESPIRATORY_TRACT | 2 refills | Status: DC

## 2018-03-14 MED FILL — ALBUTEROL SULFATE HFA 108 (: 108 (90 BAS | 25 days supply | Qty: 18 | Fill #0

## 2018-03-25 ENCOUNTER — Ambulatory Visit: Payer: No Typology Code available for payment source | Admitting: Cardiovascular Disease

## 2018-04-14 ENCOUNTER — Other Ambulatory Visit: Payer: Self-pay | Admitting: Physician Assistant

## 2018-04-14 DIAGNOSIS — Z9861 Coronary angioplasty status: Principal | ICD-10-CM

## 2018-04-14 DIAGNOSIS — I2510 Atherosclerotic heart disease of native coronary artery without angina pectoris: Secondary | ICD-10-CM

## 2018-04-14 MED FILL — ?ATORVASTATIN 40MG TABLET: 40 | 30 days supply | Qty: 30 | Fill #1

## 2018-04-14 MED FILL — LISINOPRIL 5 MG TAB: 5 | 30 days supply | Qty: 30 | Fill #6

## 2018-04-14 MED FILL — GABAPENTIN 300 MG CAPSULE: 300 | 30 days supply | Qty: 90 | Fill #6

## 2018-04-15 MED FILL — CLOPIDOGREL 75 MG TABLET: 75 | 30 days supply | Qty: 30 | Fill #0

## 2018-04-15 MED FILL — VIT D2 1.25 MG (50,000 UNIT: 1.25 MG | 28 days supply | Qty: 4 | Fill #2

## 2018-05-13 MED FILL — LISINOPRIL 5 MG TAB: 5 | 30 days supply | Qty: 30 | Fill #0

## 2018-05-13 MED FILL — ?CARVEDILOL 3.125 MG TABLET: 3.125 | 60 days supply | Qty: 60 | Fill #3

## 2018-05-13 MED FILL — ?ATORVASTATIN 40MG TABLET: 40 | 30 days supply | Qty: 30 | Fill #2

## 2018-05-13 MED FILL — CLOPIDOGREL 75 MG TABLET: 75 | 30 days supply | Qty: 30 | Fill #1

## 2018-05-13 MED FILL — GABAPENTIN 300 MG CAPSULE: 300 | 30 days supply | Qty: 90 | Fill #0

## 2018-05-13 MED FILL — VIT D2 1.25 MG (50,000 UNIT: 1.25 MG | 28 days supply | Qty: 4 | Fill #3

## 2018-05-14 ENCOUNTER — Ambulatory Visit: Payer: 59 | Attending: Family Medicine | Admitting: Family Medicine

## 2018-05-14 ENCOUNTER — Encounter: Payer: Self-pay | Admitting: Family Medicine

## 2018-05-14 VITALS — BP 120/81 | HR 108 | Temp 97.7°F | Resp 16 | Wt 167.6 lb

## 2018-05-14 DIAGNOSIS — I2510 Atherosclerotic heart disease of native coronary artery without angina pectoris: Secondary | ICD-10-CM

## 2018-05-14 DIAGNOSIS — I739 Peripheral vascular disease, unspecified: Secondary | ICD-10-CM | POA: Diagnosis not present

## 2018-05-14 DIAGNOSIS — I10 Essential (primary) hypertension: Secondary | ICD-10-CM | POA: Diagnosis not present

## 2018-05-14 DIAGNOSIS — F17200 Nicotine dependence, unspecified, uncomplicated: Secondary | ICD-10-CM

## 2018-05-14 DIAGNOSIS — Z9861 Coronary angioplasty status: Secondary | ICD-10-CM

## 2018-05-14 DIAGNOSIS — Z1239 Encounter for other screening for malignant neoplasm of breast: Secondary | ICD-10-CM

## 2018-05-14 DIAGNOSIS — G629 Polyneuropathy, unspecified: Secondary | ICD-10-CM

## 2018-05-14 NOTE — Progress Notes (Signed)
Subjective:  Patient ID: Robin Walsh, female    DOB: 27-Oct-1965  Age: 53 y.o. MRN: [id number]  CC: Follow-up (3 month ) and Medication Refill   HPI Robin Walsh is a 53 year old female with a history of tobacco abuse, coronary artery disease (status post PCI and drug-eluting stent placement to LAD in 11/2015) peripheral arterial disease (status post stenting of bilateral common iliac artery) who presents today for follow-up visit. She has intermittent claudication in both lower extremities which resolve at rest. Medical management of her PAD recommended by vascular at her last visit on 03/2018. She continues to smoke 1/2 pack of ciggarrette /day. She complains of 'prickly sensation in hands' worse on hyperextending her wrists with associated numbness. Denies having to wake up to shake her wrists as night for relief. Denies chest pain or dyspnea.  Past Medical History:  Diagnosis Date  . CAD (coronary artery disease)   . Hyperlipidemia   . Hypertension     Past Surgical History:  Procedure Laterality Date  . CARDIAC CATHETERIZATION N/A 11/28/2015   Procedure: Left Heart Cath and Coronary Angiography;  Surgeon: Jettie Booze, MD;  Location: [facility];  Service: Cardiovascular;  Laterality: N/A;  . CARDIAC CATHETERIZATION N/A 11/28/2015   Procedure: Coronary Stent Intervention;  Surgeon: Jettie Booze, MD;  Location: [place] [facility];  Service: Cardiovascular;  Laterality: N/A;  DES to Mid LAD; 3.0x12 Synergy  . LEG SURGERY  2002?   tibial plateau fracture  . PERIPHERAL VASCULAR CATHETERIZATION N/A 01/04/2016   Procedure: Abdominal Aortogram;  Surgeon: Nelva Bush, MD;  Location: [facility] [facility];  Service: Cardiovascular;  Laterality: N/A;  . PERIPHERAL VASCULAR CATHETERIZATION Bilateral 01/04/2016   Procedure: Lower Extremity Angiography;  Surgeon: Nelva Bush, MD;  Location: [facility];  Service: Cardiovascular;  Laterality: Bilateral;  .  PERIPHERAL VASCULAR CATHETERIZATION Bilateral 01/04/2016   Procedure: Peripheral Vascular Intervention;  Surgeon: Nelva Bush, MD;  Location: [facility];  Service: Cardiovascular;  Laterality: Bilateral;  common iliac    Family History  Problem Relation Age of Onset  . Congestive Heart Failure Mother 1  . Alcohol abuse Father 17  . Heart disease Brother   . Liver disease Sister        kidney/liver    Social History   Socioeconomic History  . Marital status: Single    Spouse name: Not on file  . Number of children: Not on file  . Years of education: Not on file  . Highest education level: Not on file  Occupational History  . Occupation: clerical work  Scientific laboratory technician  . Financial resource strain: Not on file  . Food insecurity:    Worry: Not on file    Inability: Not on file  . Transportation needs:    Medical: Not on file    Non-medical: Not on file  Tobacco Use  . Smoking status: Current Every Day Smoker    Packs/day: 0.50    Years: 25.00    Pack years: 12.50    Types: Cigarettes  . Smokeless tobacco: Never Used  Substance and Sexual Activity  . Alcohol use: No  . Drug use: Yes    Types: Marijuana    Comment: one week ago  . Sexual activity: Not on file  Lifestyle  . Physical activity:    Days per week: Not on file    Minutes per session: Not on file  . Stress: Not on file  Relationships  .  Social connections:    Talks on phone: Not on file    Gets together: Not on file    Attends religious service: Not on file    Active member of club or organization: Not on file    Attends meetings of clubs or organizations: Not on file    Relationship status: Not on file  . Intimate partner violence:    Fear of current or ex partner: Not on file    Emotionally abused: Not on file    Physically abused: Not on file    Forced sexual activity: Not on file  Other Topics Concern  . Not on file  Social History Narrative  . Not on file    Allergies  Allergen  Reactions  . Codeine Nausea And Vomiting  . Dilaudid [Hydromorphone Hcl] Nausea And Vomiting    Outpatient Medications Prior to Visit  Medication Sig Dispense Refill  . albuterol (VENTOLIN HFA) 108 (90 Base) MCG/ACT inhaler INHALE 2 PUFFS INTO THE LUNGS EVERY 6 HOURS AS NEEDED FOR WHEEZING OR SHORTNESS OF BREATH. 18 g 2  . aspirin 81 MG chewable tablet Chew 1 tablet (81 mg total) by mouth daily. 30 tablet 11  . atorvastatin (LIPITOR) 40 MG tablet TAKE 1 TABLET BY MOUTH DAILY AT 6 PM. 30 tablet 6  . carvedilol (COREG) 3.125 MG tablet Take 1 tablet (3.125 mg total) by mouth daily. 60 tablet 6  . cetirizine (ZYRTEC) 10 MG tablet Take 1 tablet (10 mg total) by mouth daily. 30 tablet 1  . clopidogrel (PLAVIX) 75 MG tablet TAKE 1 TABLET BY MOUTH DAILY WITH BREAKFAST. 30 tablet 3  . diclofenac sodium (VOLTAREN) 1 % GEL Apply 4 g topically 4 (four) times daily. 100 g 1  . gabapentin (NEURONTIN) 300 MG capsule Take 3 capsules (900 mg total) by mouth at bedtime. 90 capsule 6  . lisinopril (PRINIVIL,ZESTRIL) 5 MG tablet Take 1 tablet (5 mg total) by mouth daily. 30 tablet 6  . nitroGLYCERIN (NITROSTAT) 0.4 MG SL tablet Place 1 tablet (0.4 mg total) under the tongue every 5 (five) minutes as needed for chest pain. X 3 doses 25 tablet 1  . predniSONE (DELTASONE) 20 MG tablet Take 1 tablet (20 mg total) by mouth daily with breakfast. 5 tablet 0  . tiZANidine (ZANAFLEX) 4 MG tablet Take 1 tablet (4 mg total) by mouth every 6 (six) hours as needed for muscle spasms. 30 tablet 0  . varenicline (CHANTIX CONTINUING MONTH PAK) 1 MG tablet Take 1 tablet (1 mg total) by mouth 2 (two) times daily. 60 tablet 1  . varenicline (CHANTIX STARTING MONTH PAK) 0.5 MG X 11 & 1 MG X 42 tablet Take one 0.5 mg tablet by mouth once daily for 3 days, then increase to one 0.5 mg tablet twice daily for 4 days, then increase to one 1 mg tablet twice daily. 53 tablet 0  . Vitamin D, Ergocalciferol, (DRISDOL) 50000 units CAPS capsule  Take 1 capsule (50,000 Units total) by mouth every 7 (seven) days. 16 capsule 0   No facility-administered medications prior to visit.      ROS Review of Systems General: negative for fever, weight loss, appetite change Eyes: no visual symptoms. ENT: no ear symptoms, no sinus tenderness, no nasal congestion or sore throat. Neck: no pain  Respiratory: no wheezing, shortness of breath, cough Cardiovascular: no chest pain, no dyspnea on exertion, no pedal edema, no orthopnea.+ intermittent claudication Gastrointestinal: no abdominal pain, no diarrhea, no constipation Genito-Urinary: no urinary  frequency, no dysuria, no polyuria. Hematologic: no bruising Endocrine: no cold or heat intolerance Neurological: no headaches, no seizures, no tremors Musculoskeletal: no joint pains, no joint swelling Skin: no pruritus, no rash. Psychological: no depression, no anxiety,    Objective:  BP 120/81   Pulse (!) 108   Temp 97.7 F (36.5 C) (Oral)   Resp 16   Wt 167 lb 9.6 oz (76 kg)   SpO2 99%   BMI 28.77 kg/m   BP/Weight 05/14/2018 03/11/2018 05/14/4268  Systolic BP 623 762 831  Diastolic BP 81 76 72  Wt. (Lbs) 167.6 170.2 166  BMI 28.77 29.21 28.49      Physical Exam Constitutional: normal appearing,  Eyes: PERRLA HEENT: Head is atraumatic, normal sinuses, normal oropharynx, normal appearing tonsils and palate, tympanic membrane is normal bilaterally. Neck: normal range of motion, no thyromegaly, no JVD Cardiovascular: tchycardic rate and rhythm, normal heart sounds, no murmurs, rub or gallop, no pedal edema Respiratory: Normal breath sounds, clear to auscultation bilaterally, no wheezes, no rales, no rhonchi Abdomen: soft, not tender to palpation, normal bowel sounds, no enlarged organs Musculoskeletal: Full ROM, no tenderness in joints Skin: warm and dry, no lesions. Neurological: alert, oriented x3, cranial nerves I-XII grossly intact , normal motor strength, normal  sensation. Psychological: normal mood.   CMP Latest Ref Rng & Units 09/26/2017 03/12/2017 09/07/2016  Glucose 65 - 99 mg/dL 93 105(H) 94  BUN 6 - 24 mg/dL 11 7 11   Creatinine 0.57 - 1.00 mg/dL 1.05(H) 1.11(H) 0.99  Sodium 134 - 144 mmol/L 140 139 141  Potassium 3.5 - 5.2 mmol/L 4.9 4.6 4.9  Chloride 96 - 106 mmol/L 107(H) 104 103  CO2 20 - 29 mmol/L 22 23 23   Calcium 8.7 - 10.2 mg/dL 9.3 9.2 9.9  Total Protein 6.0 - 8.5 g/dL 6.9 - 7.4  Total Bilirubin 0.0 - 1.2 mg/dL 0.2 - <0.2  Alkaline Phos 39 - 117 IU/L 65 - 63  AST 0 - 40 IU/L 9 - 12  ALT 0 - 32 IU/L 6 - 10    Lipid Panel     Component Value Date/Time   CHOL 145 09/26/2017 0957   TRIG 225 (H) 09/26/2017 0957   HDL 26 (L) 09/26/2017 0957   CHOLHDL 5.6 (H) 09/26/2017 0957   CHOLHDL 7.8 11/27/2015 0453   VLDL 66 (H) 11/27/2015 0453   LDLCALC 74 09/26/2017 0957    CBC    Component Value Date/Time   WBC 10.1 02/11/2018 1624   WBC 8.3 01/04/2016 0815   RBC 4.09 02/11/2018 1624   RBC 4.63 01/04/2016 0815   HGB 12.8 02/11/2018 1624   HCT 38.1 02/11/2018 1624   PLT 340 02/11/2018 1624   MCV 93 02/11/2018 1624   MCH 31.3 02/11/2018 1624   MCH 31.1 01/04/2016 0815   MCHC 33.6 02/11/2018 1624   MCHC 32.2 01/04/2016 0815   RDW 11.8 (L) 02/11/2018 1624   LYMPHSABS 3.3 (H) 02/11/2018 1624   EOSABS 0.5 (H) 02/11/2018 1624   BASOSABS 0.0 02/11/2018 1624    Lab Results  Component Value Date   HGBA1C 5.8 (H) 11/27/2015    Assessment & Plan:   1. CAD S/P DES PCI TO mLAD - Synergy DES 3.0 mm x 12 mm Asymptomatic Risk factor modification  2. Essential hypertension Controlled Continue antihypertensives Counseled on blood pressure goal of less than 130/80, low-sodium, DASH diet, medication compliance, 150 minutes of moderate intensity exercise per week. Discussed medication compliance, adverse effects.   3.  PAD (peripheral artery disease) (HCC) Ongoing intermittent claudication Advised str0ngly to quit smoking  4.  Tobacco dependence Smoking cessation support: smoking cessation hotline: 1-800-QUIT-NOW.  Smoking cessation classes are available through [facility]. Call [phone] or visit our website at [email or web address]  Spent 3 minutes counseling on dangers of tobacco use and benefits of quitting and patient is not ready to quit.   5. Neuropathy Already on Gabapentin Advised to use wrist brace  6. Screening for breast cancer - MM Digital Screening; Future   No orders of the defined types were placed in this encounter.   Follow-up: Return in about 3 months (around 08/12/2018) for followup of chronic medical conditions.   Charlott Rakes MD    Charlott Rakes, MD, FAAFP. [facility] [place], [place]   05/14/2018, 8:52 AM

## 2018-05-14 NOTE — Patient Instructions (Signed)
Steps to Quit Smoking    Smoking tobacco can be bad for your health. It can also affect almost every organ in your body. Smoking puts you and people around you at risk for many serious long-lasting (chronic) diseases. Quitting smoking is hard, but it is one of the best things that you can do for your health. It is never too late to quit.  What are the benefits of quitting smoking?  When you quit smoking, you lower your risk for getting serious diseases and conditions. They can include:  · Lung cancer or lung disease.  · Heart disease.  · Stroke.  · Heart attack.  · Not being able to have children (infertility).  · Weak bones (osteoporosis) and broken bones (fractures).  If you have coughing, wheezing, and shortness of breath, those symptoms may get better when you quit. You may also get sick less often. If you are pregnant, quitting smoking can help to lower your chances of having a baby of low birth weight.  What can I do to help me quit smoking?  Talk with your doctor about what can help you quit smoking. Some things you can do (strategies) include:  · Quitting smoking totally, instead of slowly cutting back how much you smoke over a period of time.  · Going to in-person counseling. You are more likely to quit if you go to many counseling sessions.  · Using resources and support systems, such as:  ? Online chats with a counselor.  ? Phone quitlines.  ? Printed self-help materials.  ? Support groups or group counseling.  ? Text messaging programs.  ? Mobile phone apps or applications.  · Taking medicines. Some of these medicines may have nicotine in them. If you are pregnant or breastfeeding, do not take any medicines to quit smoking unless your doctor says it is okay. Talk with your doctor about counseling or other things that can help you.  Talk with your doctor about using more than one strategy at the same time, such as taking medicines while you are also going to in-person counseling. This can help make  quitting easier.  What things can I do to make it easier to quit?  Quitting smoking might feel very hard at first, but there is a lot that you can do to make it easier. Take these steps:  · Talk to your family and friends. Ask them to support and encourage you.  · Call phone quitlines, reach out to support groups, or work with a counselor.  · Ask people who smoke to not smoke around you.  · Avoid places that make you want (trigger) to smoke, such as:  ? Bars.  ? Parties.  ? Smoke-break areas at work.  · Spend time with people who do not smoke.  · Lower the stress in your life. Stress can make you want to smoke. Try these things to help your stress:  ? Getting regular exercise.  ? Deep-breathing exercises.  ? Yoga.  ? Meditating.  ? Doing a body scan. To do this, close your eyes, focus on one area of your body at a time from head to toe, and notice which parts of your body are tense. Try to relax the muscles in those areas.  · Download or buy apps on your mobile phone or tablet that can help you stick to your quit plan. There are many free apps, such as QuitGuide from the CDC (Centers for Disease Control and Prevention). You can find more   support from smokefree.gov and other websites.  This information is not intended to replace advice given to you by your health care provider. Make sure you discuss any questions you have with your health care provider.  Document Released: 01/20/2009 Document Revised: 11/22/2015 Document Reviewed: 08/10/2014  Elsevier Interactive Patient Education © 2019 [employer]

## 2018-05-15 ENCOUNTER — Encounter: Payer: Self-pay | Admitting: Family Medicine

## 2018-05-20 ENCOUNTER — Ambulatory Visit: Payer: No Typology Code available for payment source | Admitting: Nurse Practitioner

## 2018-05-20 NOTE — Progress Notes (Deleted)
Primary Care Physician:  Charlott Rakes, MD Primary Gastroenterologist:  Dr.   Rayne Du chief complaint on file.   HPI:   Robin Walsh is a 53 y.o. female who presents on referral from primary care to schedule first ever colonoscopy.  Nurse/phone triage was deferred to office visit due to medications likely necessitating augmented sedation.  Reviewed information provided with the referral including ***.  No history of colonoscopy in our system.  Today she states   Past Medical History:  Diagnosis Date  . CAD (coronary artery disease)   . Hyperlipidemia   . Hypertension     Past Surgical History:  Procedure Laterality Date  . CARDIAC CATHETERIZATION N/A 11/28/2015   Procedure: Left Heart Cath and Coronary Angiography;  Surgeon: Jettie Booze, MD;  Location: [place] [facility];  Service: Cardiovascular;  Laterality: N/A;  . CARDIAC CATHETERIZATION N/A 11/28/2015   Procedure: Coronary Stent Intervention;  Surgeon: Jettie Booze, MD;  Location: [place] [facility];  Service: Cardiovascular;  Laterality: N/A;  DES to Mid LAD; 3.0x12 Synergy  . LEG SURGERY  2002?   tibial plateau fracture  . PERIPHERAL VASCULAR CATHETERIZATION N/A 01/04/2016   Procedure: Abdominal Aortogram;  Surgeon: Nelva Bush, MD;  Location: [place] [facility];  Service: Cardiovascular;  Laterality: N/A;  . PERIPHERAL VASCULAR CATHETERIZATION Bilateral 01/04/2016   Procedure: Lower Extremity Angiography;  Surgeon: Nelva Bush, MD;  Location: [place] [facility];  Service: Cardiovascular;  Laterality: Bilateral;  . PERIPHERAL VASCULAR CATHETERIZATION Bilateral 01/04/2016   Procedure: Peripheral Vascular Intervention;  Surgeon: Nelva Bush, MD;  Location: [place] [facility];  Service: Cardiovascular;  Laterality: Bilateral;  common iliac    Current Outpatient Medications  Medication Sig Dispense Refill  . albuterol (VENTOLIN HFA) 108 (90 Base) MCG/ACT inhaler INHALE 2 PUFFS INTO THE LUNGS EVERY  6 HOURS AS NEEDED FOR WHEEZING OR SHORTNESS OF BREATH. 18 g 2  . aspirin 81 MG chewable tablet Chew 1 tablet (81 mg total) by mouth daily. 30 tablet 11  . atorvastatin (LIPITOR) 40 MG tablet TAKE 1 TABLET BY MOUTH DAILY AT 6 PM. 30 tablet 6  . carvedilol (COREG) 3.125 MG tablet Take 1 tablet (3.125 mg total) by mouth daily. 60 tablet 6  . cetirizine (ZYRTEC) 10 MG tablet Take 1 tablet (10 mg total) by mouth daily. 30 tablet 1  . clopidogrel (PLAVIX) 75 MG tablet TAKE 1 TABLET BY MOUTH DAILY WITH BREAKFAST. 30 tablet 3  . diclofenac sodium (VOLTAREN) 1 % GEL Apply 4 g topically 4 (four) times daily. 100 g 1  . gabapentin (NEURONTIN) 300 MG capsule Take 3 capsules (900 mg total) by mouth at bedtime. 90 capsule 6  . lisinopril (PRINIVIL,ZESTRIL) 5 MG tablet Take 1 tablet (5 mg total) by mouth daily. 30 tablet 6  . nitroGLYCERIN (NITROSTAT) 0.4 MG SL tablet Place 1 tablet (0.4 mg total) under the tongue every 5 (five) minutes as needed for chest pain. X 3 doses 25 tablet 1  . predniSONE (DELTASONE) 20 MG tablet Take 1 tablet (20 mg total) by mouth daily with breakfast. 5 tablet 0  . tiZANidine (ZANAFLEX) 4 MG tablet Take 1 tablet (4 mg total) by mouth every 6 (six) hours as needed for muscle spasms. 30 tablet 0  . varenicline (CHANTIX CONTINUING MONTH PAK) 1 MG tablet Take 1 tablet (1 mg total) by mouth 2 (two) times daily. 60 tablet 1  . varenicline (CHANTIX STARTING MONTH PAK) 0.5 MG X 11 & 1 MG  X 42 tablet Take one 0.5 mg tablet by mouth once daily for 3 days, then increase to one 0.5 mg tablet twice daily for 4 days, then increase to one 1 mg tablet twice daily. 53 tablet 0  . Vitamin D, Ergocalciferol, (DRISDOL) 50000 units CAPS capsule Take 1 capsule (50,000 Units total) by mouth every 7 (seven) days. 16 capsule 0   No current facility-administered medications for this visit.     Allergies as of 05/20/2018 - Review Complete 05/15/2018  Allergen Reaction Noted  . Codeine Nausea And Vomiting  11/26/2015  . Dilaudid [hydromorphone hcl] Nausea And Vomiting 11/26/2015    Family History  Problem Relation Age of Onset  . Congestive Heart Failure Mother 39  . Alcohol abuse Father 80  . Heart disease Brother   . Liver disease Sister        kidney/liver    Social History   Socioeconomic History  . Marital status: Single    Spouse name: Not on file  . Number of children: Not on file  . Years of education: Not on file  . Highest education level: Not on file  Occupational History  . Occupation: clerical work  Scientific laboratory technician  . Financial resource strain: Not on file  . Food insecurity:    Worry: Not on file    Inability: Not on file  . Transportation needs:    Medical: Not on file    Non-medical: Not on file  Tobacco Use  . Smoking status: Current Every Day Smoker    Packs/day: 0.50    Years: 25.00    Pack years: 12.50    Types: Cigarettes  . Smokeless tobacco: Never Used  Substance and Sexual Activity  . Alcohol use: No  . Drug use: Yes    Types: Marijuana    Comment: one week ago  . Sexual activity: Not on file  Lifestyle  . Physical activity:    Days per week: Not on file    Minutes per session: Not on file  . Stress: Not on file  Relationships  . Social connections:    Talks on phone: Not on file    Gets together: Not on file    Attends religious service: Not on file    Active member of club or organization: Not on file    Attends meetings of clubs or organizations: Not on file    Relationship status: Not on file  . Intimate partner violence:    Fear of current or ex partner: Not on file    Emotionally abused: Not on file    Physically abused: Not on file    Forced sexual activity: Not on file  Other Topics Concern  . Not on file  Social History Narrative  . Not on file    Review of Systems: General: Negative for anorexia, weight loss, fever, chills, fatigue, weakness. Eyes: Negative for vision changes.  ENT: Negative for hoarseness, difficulty  swallowing , nasal congestion. CV: Negative for chest pain, angina, palpitations, dyspnea on exertion, peripheral edema.  Respiratory: Negative for dyspnea at rest, dyspnea on exertion, cough, sputum, wheezing.  GI: See history of present illness. GU:  Negative for dysuria, hematuria, urinary incontinence, urinary frequency, nocturnal urination.  MS: Negative for joint pain, low back pain.  Derm: Negative for rash or itching.  Neuro: Negative for weakness, abnormal sensation, seizure, frequent headaches, memory loss, confusion.  Psych: Negative for anxiety, depression, suicidal ideation, hallucinations.  Endo: Negative for unusual weight change.  Heme: Negative  for bruising or bleeding. Allergy: Negative for rash or hives.    Physical Exam: There were no vitals taken for this visit. General:   Alert and oriented. Pleasant and cooperative. Well-nourished and well-developed.  Head:  Normocephalic and atraumatic. Eyes:  Without icterus, sclera clear and conjunctiva pink.  Ears:  Normal auditory acuity. Mouth:  No deformity or lesions, oral mucosa pink.  Throat/Neck:  Supple, without mass or thyromegaly. Cardiovascular:  S1, S2 present without murmurs appreciated. Normal pulses noted. Extremities without clubbing or edema. Respiratory:  Clear to auscultation bilaterally. No wheezes, rales, or rhonchi. No distress.  Gastrointestinal:  +BS, soft, non-tender and non-distended. No HSM noted. No guarding or rebound. No masses appreciated.  Rectal:  Deferred  Musculoskalatal:  Symmetrical without gross deformities. Normal posture. Skin:  Intact without significant lesions or rashes. Neurologic:  Alert and oriented x4;  grossly normal neurologically. Psych:  Alert and cooperative. Normal mood and affect. Heme/Lymph/Immune: No significant cervical adenopathy. No excessive bruising noted.    05/20/2018 3:55 PM   Disclaimer: This note was dictated with voice recognition software. Similar  sounding words can inadvertently be transcribed and may not be corrected upon review.

## 2018-06-17 MED FILL — CLOPIDOGREL 75 MG TABLET: 75 | 30 days supply | Qty: 30 | Fill #2 | Status: TO

## 2018-06-17 MED FILL — GABAPENTIN 300 MG CAPSULE: 300 | 30 days supply | Qty: 90 | Fill #1 | Status: TO

## 2018-06-17 MED FILL — ?ATORVASTATIN 40MG TABLET: 40 | 30 days supply | Qty: 30 | Fill #3 | Status: TO

## 2018-06-17 MED FILL — LISINOPRIL 5 MG TAB: 5 | 30 days supply | Qty: 30 | Fill #1 | Status: TO

## 2018-08-11 ENCOUNTER — Encounter: Payer: Self-pay | Admitting: Family Medicine

## 2018-08-14 ENCOUNTER — Other Ambulatory Visit: Payer: Self-pay

## 2018-08-14 ENCOUNTER — Encounter: Payer: Self-pay | Admitting: Family Medicine

## 2018-08-14 ENCOUNTER — Ambulatory Visit: Payer: 59 | Attending: Family Medicine | Admitting: Family Medicine

## 2018-08-14 DIAGNOSIS — N951 Menopausal and female climacteric states: Secondary | ICD-10-CM

## 2018-08-14 DIAGNOSIS — I10 Essential (primary) hypertension: Secondary | ICD-10-CM

## 2018-08-14 DIAGNOSIS — I739 Peripheral vascular disease, unspecified: Secondary | ICD-10-CM

## 2018-08-14 DIAGNOSIS — Z9861 Coronary angioplasty status: Secondary | ICD-10-CM

## 2018-08-14 DIAGNOSIS — I2510 Atherosclerotic heart disease of native coronary artery without angina pectoris: Secondary | ICD-10-CM | POA: Diagnosis not present

## 2018-08-14 DIAGNOSIS — J3489 Other specified disorders of nose and nasal sinuses: Secondary | ICD-10-CM | POA: Diagnosis not present

## 2018-08-14 DIAGNOSIS — F17200 Nicotine dependence, unspecified, uncomplicated: Secondary | ICD-10-CM

## 2018-08-14 DIAGNOSIS — F17210 Nicotine dependence, cigarettes, uncomplicated: Secondary | ICD-10-CM | POA: Diagnosis not present

## 2018-08-14 DIAGNOSIS — R519 Headache, unspecified: Secondary | ICD-10-CM

## 2018-08-14 DIAGNOSIS — R51 Headache: Secondary | ICD-10-CM | POA: Diagnosis not present

## 2018-08-14 MED ORDER — GABAPENTIN 300 MG PO CAPS: 600.0000 mg | ORAL_CAPSULE | Freq: Two times a day (BID) | ORAL | 1 refills | Status: DC

## 2018-08-14 MED ORDER — ATORVASTATIN CALCIUM 40 MG PO TABS: ORAL_TABLET | ORAL | 1 refills | Status: DC

## 2018-08-14 MED ORDER — CLOPIDOGREL BISULFATE 75 MG PO TABS: 75.0000 mg | ORAL_TABLET | Freq: Every day | ORAL | 1 refills | Status: DC

## 2018-08-14 MED ORDER — CETIRIZINE HCL 10 MG PO TABS: 10.0000 mg | ORAL_TABLET | Freq: Every day | ORAL | 1 refills | Status: DC

## 2018-08-14 MED ORDER — CARVEDILOL 3.125 MG PO TABS: 3.1250 mg | ORAL_TABLET | Freq: Every day | ORAL | 1 refills | Status: DC

## 2018-08-14 MED ORDER — LISINOPRIL 5 MG PO TABS: 5.0000 mg | ORAL_TABLET | Freq: Every day | ORAL | 1 refills | Status: DC

## 2018-08-14 MED FILL — ?ATORVASTATIN 40MG TABLET: 40 | 30 days supply | Qty: 30 | Fill #0

## 2018-08-14 MED FILL — ?CARVEDILOL 3.125 MG TABLET: 3.125 | 30 days supply | Qty: 30 | Fill #4

## 2018-08-14 MED FILL — CLOPIDOGREL 75 MG TABLET: 75 | 30 days supply | Qty: 30 | Fill #0

## 2018-08-14 MED FILL — GABAPENTIN 300 MG CAPSULE: 300 | 30 days supply | Qty: 120 | Fill #0

## 2018-08-14 MED FILL — LISINOPRIL 5 MG TAB: 5 | 30 days supply | Qty: 30 | Fill #0

## 2018-08-14 NOTE — Progress Notes (Signed)
Virtual Visit via Telephone Note  I connected with [place], on 08/14/2018 at 8:40 AM by telephone due to the COVID-19 pandemic and verified that I am speaking with the correct person using two identifiers.   Consent: I discussed the limitations, risks, security and privacy concerns of performing an evaluation and management service by telephone and the availability of in person appointments. I also discussed with the patient that there may be a patient responsible charge related to this service. The patient expressed understanding and agreed to proceed.   Location of Patient: Home  Location of Provider: Clinic   Persons participating in Telemedicine visit: Jazmine A Boardman  Alicia Farrington-CMA Dr. Felecia Shelling     History of Present Illness: Robin Walsh is a 53 year old female with a history of tobacco abuse, coronary artery disease (status post PCI and drug-eluting stent placement to LAD in 11/2015) peripheral arterial disease (status post stenting of bilateral common iliac artery) who presents today for follow-up visit. She has intermittent claudication in both lower extremities which resolve at rest. Medical management of her PAD recommended by vascular at her last visit on 03/2018.  Vascular ultrasound of the aorta and iliacs from 11/2017 revealed aortoiliac atherosclerosis, progression of bilateral common iliac artery disease with high velocities compared to prior exam.  She is planning on scheduling an appointment with her vascular surgeon and her cardiologist for follow-up.  Denies chest pains, dyspnea, pedal edema. She continues to smoke half a pack of cigarette and never commenced Chantix which was prescribed as she is not ready.  She is finding it difficult to quit. Her hot flashes are controlled on gabapentin however she still has insomnia.  We had tried clonidine in the past with no relief.  As result she does have fatigue during the day and was placed on vitamin D  for vitamin D deficiency but noticed no improvement.  She endorses drinking lots of caffeine during the day. Gabapentin also helps with her paresthesias in her hands.  Past Medical History:  Diagnosis Date  . CAD (coronary artery disease)   . Hyperlipidemia   . Hypertension    Allergies  Allergen Reactions  . Codeine Nausea And Vomiting  . Dilaudid [Hydromorphone Hcl] Nausea And Vomiting    Current Outpatient Medications on File Prior to Visit  Medication Sig Dispense Refill  . albuterol (VENTOLIN HFA) 108 (90 Base) MCG/ACT inhaler INHALE 2 PUFFS INTO THE LUNGS EVERY 6 HOURS AS NEEDED FOR WHEEZING OR SHORTNESS OF BREATH. 18 g 2  . aspirin 81 MG chewable tablet Chew 1 tablet (81 mg total) by mouth daily. 30 tablet 11  . atorvastatin (LIPITOR) 40 MG tablet TAKE 1 TABLET BY MOUTH DAILY AT 6 PM. 30 tablet 6  . carvedilol (COREG) 3.125 MG tablet Take 1 tablet (3.125 mg total) by mouth daily. 60 tablet 6  . cetirizine (ZYRTEC) 10 MG tablet Take 1 tablet (10 mg total) by mouth daily. 30 tablet 1  . clopidogrel (PLAVIX) 75 MG tablet TAKE 1 TABLET BY MOUTH DAILY WITH BREAKFAST. 30 tablet 3  . diclofenac sodium (VOLTAREN) 1 % GEL Apply 4 g topically 4 (four) times daily. 100 g 1  . gabapentin (NEURONTIN) 300 MG capsule Take 3 capsules (900 mg total) by mouth at bedtime. 90 capsule 6  . lisinopril (PRINIVIL,ZESTRIL) 5 MG tablet Take 1 tablet (5 mg total) by mouth daily. 30 tablet 6  . nitroGLYCERIN (NITROSTAT) 0.4 MG SL tablet Place 1 tablet (0.4 mg total) under the tongue every  5 (five) minutes as needed for chest pain. X 3 doses 25 tablet 1  . predniSONE (DELTASONE) 20 MG tablet Take 1 tablet (20 mg total) by mouth daily with breakfast. (Patient not taking: Reported on 08/14/2018) 5 tablet 0  . tiZANidine (ZANAFLEX) 4 MG tablet Take 1 tablet (4 mg total) by mouth every 6 (six) hours as needed for muscle spasms. (Patient not taking: Reported on 08/14/2018) 30 tablet 0  . varenicline (CHANTIX  CONTINUING MONTH PAK) 1 MG tablet Take 1 tablet (1 mg total) by mouth 2 (two) times daily. (Patient not taking: Reported on 08/14/2018) 60 tablet 1  . varenicline (CHANTIX STARTING MONTH PAK) 0.5 MG X 11 & 1 MG X 42 tablet Take one 0.5 mg tablet by mouth once daily for 3 days, then increase to one 0.5 mg tablet twice daily for 4 days, then increase to one 1 mg tablet twice daily. (Patient not taking: Reported on 08/14/2018) 53 tablet 0  . Vitamin D, Ergocalciferol, (DRISDOL) 50000 units CAPS capsule Take 1 capsule (50,000 Units total) by mouth every 7 (seven) days. (Patient not taking: Reported on 08/14/2018) 16 capsule 0   No current facility-administered medications on file prior to visit.     Observations/Objective: Awake, alert, oriented x3 Not in acute distress  Assessment and Plan: 1. CAD S/P DES PCI TO mLAD - Synergy DES 3.0 mm x 12 mm Asymptomatic Risk factor modification - atorvastatin (LIPITOR) 40 MG tablet; TAKE 1 TABLET BY MOUTH DAILY AT 6 PM.  Dispense: 90 tablet; Refill: 1 - clopidogrel (PLAVIX) 75 MG tablet; Take 1 tablet (75 mg total) by mouth daily with breakfast.  Dispense: 90 tablet; Refill: 1  2. Essential hypertension Controlled at last office visit No regimen change today Counseled on blood pressure goal of less than 130/80, low-sodium, DASH diet, medication compliance, 150 minutes of moderate intensity exercise per week. Discussed medication compliance, adverse effects. - carvedilol (COREG) 3.125 MG tablet; Take 1 tablet (3.125 mg total) by mouth daily.  Dispense: 180 tablet; Refill: 1 - lisinopril (ZESTRIL) 5 MG tablet; Take 1 tablet (5 mg total) by mouth daily.  Dispense: 90 tablet; Refill: 1  3. Sinus headache - cetirizine (ZYRTEC) 10 MG tablet; Take 1 tablet (10 mg total) by mouth daily.  Dispense: 90 tablet; Refill: 1  4. Hot flash, menopausal Controlled Due to her paresthesias she is needing a higher dose of gabapentin hence I will increase - gabapentin  (NEURONTIN) 300 MG capsule; Take 2 capsules (600 mg total) by mouth 2 (two) times daily.  Dispense: 360 capsule; Refill: 1  5. PAD (peripheral artery disease) (HCC) Symptoms are minimal Medical treatment as per vascular Smoking cessation is highly recommended Follow-up with vascular  6. Tobacco dependence Spent 3 minutes counseling on smoking cessation and pharmacotherapy to help her quit.  She is yet to commence Chantix and is not ready to quit at this time   Follow Up Instructions: Return in about 3 months (around 11/14/2018).    I discussed the assessment and treatment plan with the patient. The patient was provided an opportunity to ask questions and all were answered. The patient agreed with the plan and demonstrated an understanding of the instructions.   The patient was advised to call back or seek an in-person evaluation if the symptoms worsen or if the condition fails to improve as anticipated.     I provided 25 minutes total of non-face-to-face time during this encounter including median intraservice time, reviewing previous notes, labs, imaging, medications  and explaining diagnosis and management.     Charlott Rakes, MD, FAAFP. [facility] and [place], [place]   08/14/2018, 8:40 AM

## 2018-08-14 NOTE — Progress Notes (Signed)
Patient has been called and DOB has been verified. Patient has been screened and transferred to PCP to start phone visit.   Patient has no energy.

## 2018-09-08 ENCOUNTER — Telehealth: Payer: Self-pay | Admitting: *Deleted

## 2018-09-08 NOTE — Telephone Encounter (Signed)
A message was left, re: follow up visit. 

## 2018-09-15 NOTE — Telephone Encounter (Signed)
Second message left,re: follow up visit. 

## 2018-09-16 MED FILL — ATORVASTATIN CALCIUM 40 MG: 40 | 30 days supply | Qty: 30 | Fill #1

## 2018-09-16 MED FILL — LISINOPRIL 5 MG TAB: 5 | 30 days supply | Qty: 30 | Fill #1

## 2018-09-16 MED FILL — CARVEDILOL 3.125 MG TABLET: 3.125 | 30 days supply | Qty: 30 | Fill #5

## 2018-09-16 MED FILL — CLOPIDOGREL 75 MG TABLET: 75 | 30 days supply | Qty: 30 | Fill #1

## 2018-09-16 MED FILL — GABAPENTIN 300 MG CAPSULE: 300 | 30 days supply | Qty: 120 | Fill #1

## 2018-09-22 NOTE — Telephone Encounter (Signed)
A ,message was left,re: follow up visit x three.

## 2018-10-14 ENCOUNTER — Other Ambulatory Visit: Payer: Self-pay | Admitting: Family Medicine

## 2018-10-14 ENCOUNTER — Encounter: Payer: Self-pay | Admitting: Family Medicine

## 2018-10-14 DIAGNOSIS — Z1211 Encounter for screening for malignant neoplasm of colon: Secondary | ICD-10-CM

## 2018-10-20 ENCOUNTER — Telehealth: Payer: Self-pay | Admitting: *Deleted

## 2018-10-20 ENCOUNTER — Encounter: Payer: Self-pay | Admitting: *Deleted

## 2018-10-20 NOTE — Telephone Encounter (Signed)
Left message for the patient to call back to see if she could do an appointment tomorrow with Dr. Fletcher Anon at 11:20 am. MyChart message was also sent.

## 2018-10-21 ENCOUNTER — Encounter: Payer: Self-pay | Admitting: Cardiovascular Disease

## 2018-10-21 ENCOUNTER — Ambulatory Visit (INDEPENDENT_AMBULATORY_CARE_PROVIDER_SITE_OTHER): Payer: 59 | Admitting: Cardiovascular Disease

## 2018-10-21 ENCOUNTER — Other Ambulatory Visit: Payer: Self-pay

## 2018-10-21 ENCOUNTER — Encounter: Payer: Self-pay | Admitting: *Deleted

## 2018-10-21 VITALS — BP 126/74 | HR 93 | Ht 64.0 in | Wt 164.4 lb

## 2018-10-21 DIAGNOSIS — E785 Hyperlipidemia, unspecified: Secondary | ICD-10-CM | POA: Diagnosis not present

## 2018-10-21 DIAGNOSIS — I739 Peripheral vascular disease, unspecified: Secondary | ICD-10-CM

## 2018-10-21 DIAGNOSIS — I2510 Atherosclerotic heart disease of native coronary artery without angina pectoris: Secondary | ICD-10-CM | POA: Diagnosis not present

## 2018-10-21 DIAGNOSIS — Z720 Tobacco use: Secondary | ICD-10-CM | POA: Diagnosis not present

## 2018-10-21 MED ORDER — SODIUM CHLORIDE 0.9% FLUSH: 3.0000 mL | Freq: Two times a day (BID) | INTRAVENOUS | Status: DC

## 2018-10-21 MED FILL — ATORVASTATIN CALCIUM 40 MG: 40 | 30 days supply | Qty: 30 | Fill #2

## 2018-10-21 MED FILL — LISINOPRIL 5 MG TAB: 5 | 30 days supply | Qty: 30 | Fill #2

## 2018-10-21 MED FILL — GABAPENTIN 300 MG CAPSULE: 300 | 30 days supply | Qty: 120 | Fill #2

## 2018-10-21 MED FILL — ?CARVEDILOL 3.125 MG TABLET: 3.125 | 30 days supply | Qty: 30 | Fill #1

## 2018-10-21 NOTE — Progress Notes (Signed)
Cardiology Office Note   Date:  10/21/2018   ID:  Robin Walsh, DOB May 08, 1965, MRN [id number]  PCP:  Charlott Rakes, MD  Cardiologist:  Dr. Marlou Porch  No chief complaint on file.     History of Present Illness: Robin Walsh is a 53 y.o. female who presents for a follow-up visit regarding  peripheral arterial disease. She has known history of coronary artery disease, hypertension, hyperlipidemia and tobacco use. She has known history of coronary artery disease status post LAD PCI in 2017. She is status post bilateral common iliac artery kissing stent placement in 12/2015 for severe claudication. She had no significant infrainguinal disease. She has recurrent claudication due to significant in-stent restenosis and bilateral common iliac arteries.  She now reports significant worsening of bilateral leg claudication which is happening with minimal walking on flat level.  This has significantly affected her activities of daily living.  She has occasional chest pain described as burning and she thinks it is due to GERD.  It is different from her prior angina in 2017. Unfortunately, she continues to smoke half a pack per day.  Past Medical History:  Diagnosis Date  . CAD (coronary artery disease)   . Hyperlipidemia   . Hypertension     Past Surgical History:  Procedure Laterality Date  . CARDIAC CATHETERIZATION N/A 11/28/2015   Procedure: Left Heart Cath and Coronary Angiography;  Surgeon: Jettie Booze, MD;  Location: [place] [facility];  Service: Cardiovascular;  Laterality: N/A;  . CARDIAC CATHETERIZATION N/A 11/28/2015   Procedure: Coronary Stent Intervention;  Surgeon: Jettie Booze, MD;  Location: [place] [facility];  Service: Cardiovascular;  Laterality: N/A;  DES to Mid LAD; 3.0x12 Synergy  . LEG SURGERY  2002?   tibial plateau fracture  . PERIPHERAL VASCULAR CATHETERIZATION N/A 01/04/2016   Procedure: Abdominal Aortogram;  Surgeon: Nelva Bush, MD;   Location: [place] [facility];  Service: Cardiovascular;  Laterality: N/A;  . PERIPHERAL VASCULAR CATHETERIZATION Bilateral 01/04/2016   Procedure: Lower Extremity Angiography;  Surgeon: Nelva Bush, MD;  Location: [place] [facility];  Service: Cardiovascular;  Laterality: Bilateral;  . PERIPHERAL VASCULAR CATHETERIZATION Bilateral 01/04/2016   Procedure: Peripheral Vascular Intervention;  Surgeon: Nelva Bush, MD;  Location: [place] [facility];  Service: Cardiovascular;  Laterality: Bilateral;  common iliac     Current Outpatient Medications  Medication Sig Dispense Refill  . albuterol (VENTOLIN HFA) 108 (90 Base) MCG/ACT inhaler INHALE 2 PUFFS INTO THE LUNGS EVERY 6 HOURS AS NEEDED FOR WHEEZING OR SHORTNESS OF BREATH. 18 g 2  . aspirin 81 MG chewable tablet Chew 1 tablet (81 mg total) by mouth daily. 30 tablet 11  . atorvastatin (LIPITOR) 40 MG tablet TAKE 1 TABLET BY MOUTH DAILY AT 6 PM. 90 tablet 1  . carvedilol (COREG) 3.125 MG tablet Take 1 tablet (3.125 mg total) by mouth daily. 180 tablet 1  . cetirizine (ZYRTEC) 10 MG tablet Take 1 tablet (10 mg total) by mouth daily. 90 tablet 1  . clopidogrel (PLAVIX) 75 MG tablet Take 1 tablet (75 mg total) by mouth daily with breakfast. 90 tablet 1  . diclofenac sodium (VOLTAREN) 1 % GEL Apply 4 g topically 4 (four) times daily. 100 g 1  . gabapentin (NEURONTIN) 300 MG capsule Take 2 capsules (600 mg total) by mouth 2 (two) times daily. 360 capsule 1  . lisinopril (ZESTRIL) 5 MG tablet Take 1 tablet (5 mg total) by mouth daily. 90 tablet 1  .  nitroGLYCERIN (NITROSTAT) 0.4 MG SL tablet Place 1 tablet (0.4 mg total) under the tongue every 5 (five) minutes as needed for chest pain. X 3 doses 25 tablet 1  . tiZANidine (ZANAFLEX) 4 MG tablet Take 1 tablet (4 mg total) by mouth every 6 (six) hours as needed for muscle spasms. 30 tablet 0  . varenicline (CHANTIX CONTINUING MONTH PAK) 1 MG tablet Take 1 tablet (1 mg total) by mouth 2 (two) times daily.  60 tablet 1  . varenicline (CHANTIX STARTING MONTH PAK) 0.5 MG X 11 & 1 MG X 42 tablet Take one 0.5 mg tablet by mouth once daily for 3 days, then increase to one 0.5 mg tablet twice daily for 4 days, then increase to one 1 mg tablet twice daily. 53 tablet 0  . Vitamin D, Ergocalciferol, (DRISDOL) 50000 units CAPS capsule Take 1 capsule (50,000 Units total) by mouth every 7 (seven) days. 16 capsule 0   Current Facility-Administered Medications  Medication Dose Route Frequency Provider Last Rate Last Dose  . sodium chloride flush (NS) 0.9 % injection 3 mL  3 mL Intravenous Q12H Wellington Hampshire, MD        Allergies:   Codeine and Dilaudid [hydromorphone hcl]    Social History:  The patient  reports that she has been smoking cigarettes. She has a 12.50 pack-year smoking history. She has never used smokeless tobacco. She reports current drug use. Drug: Marijuana. She reports that she does not drink alcohol.   Family History:  The patient's family history includes Alcohol abuse (age of onset: 61) in her father; Congestive Heart Failure (age of onset: 78) in her mother; Heart disease in her brother; Liver disease in her sister.    ROS:  Please see the history of present illness.   Otherwise, review of systems are positive for none.   All other systems are reviewed and negative.    PHYSICAL EXAM: VS:  BP 126/74   Pulse 93   Ht 5\' 4"  (1.626 m)   Wt 164 lb 6.4 oz (74.6 kg)   BMI 28.22 kg/m  , BMI Body mass index is 28.22 kg/m. GEN: Well nourished, well developed, in no acute distress  HEENT: normal  Neck: no JVD, carotid bruits, or masses Cardiac: RRR; no murmurs, rubs, or gallops,no edema  Respiratory:  clear to auscultation bilaterally, normal work of breathing GI: soft, nontender, nondistended, + BS MS: no deformity or atrophy  Skin: warm and dry, no rash Neuro:  Strength and sensation are intact Psych: euthymic mood, full affect Vascular: Femoral pulses +1 bilaterally.  Distal  pulses are barely palpable bilaterally.  EKG:  EKG is  ordered today. EKG shows normal sinus rhythm with no significant ST or T wave changes.  Recent Labs: 12/11/2017: TSH 0.648 02/11/2018: Hemoglobin 12.8; Platelets 340    Lipid Panel    Component Value Date/Time   CHOL 145 09/26/2017 0957   TRIG 225 (H) 09/26/2017 0957   HDL 26 (L) 09/26/2017 0957   CHOLHDL 5.6 (H) 09/26/2017 0957   CHOLHDL 7.8 11/27/2015 0453   VLDL 66 (H) 11/27/2015 0453   LDLCALC 74 09/26/2017 0957      Wt Readings from Last 3 Encounters:  10/21/18 164 lb 6.4 oz (74.6 kg)  05/14/18 167 lb 9.6 oz (76 kg)  03/11/18 170 lb 3.2 oz (77.2 kg)       No flowsheet data found.    ASSESSMENT AND PLAN:  1.  Peripheral arterial disease with severe bilateral leg  claudication due to in-stent restenosis in the common iliac arteries.     Given progression of her symptoms to happening now with very short distance, I recommend proceeding with abdominal aortogram with lower extremity runoff and possible endovascular intervention.  The plan is to try to avoid adding another layer of stent.  The previously placed stents were 7 x 27 mm.  We could consider drug-coated balloon angioplasty and stent.  2. Tobacco use: Unfortunately, she has not been able to quit smoking and currently smokes half a pack per day.  I discussed with her the association of restenosis with continued tobacco use.  3. Coronary artery disease involving native coronary arteries with other forms of angina:   Continue medical therapy.  4. Hyperlipidemia: Continue treatment with atorvastatin with a target LDL of less than 70.   Disposition:   FU with me in 1 months  Signed,  [name], MD  10/21/2018 6:06 PM    Success

## 2018-10-21 NOTE — Patient Instructions (Addendum)
    Robin Walsh Dept: [phone] Loc: [phone]  Robin Walsh  10/21/2018  You are scheduled for a Peripheral Angiogram on Wednesday, August 5 with Dr. [name].  1. Please arrive at the [facility] (Main Entrance A) at [facility]: [address] [place], [place] [address] at 6:30 AM (This time is two hours before your procedure to ensure your preparation). Free valet parking service is available.   Special note: Every effort is made to have your procedure done on time. Please understand that emergencies sometimes delay scheduled procedures.  2. Diet: Do not eat solid foods after midnight.  The patient may have clear liquids until 5am upon the day of the procedure.  3. Labs: You will need to have blood drawn TODAY  GO TO [facility] 11-08-2018 @ 12:30 PM.  4. Medication instructions in preparation for your procedure:  On the morning of your procedure, take your Aspirin AND PLAVIX and any morning medicines.  You may use sips of water.  5. Plan for one night stay--bring personal belongings. 6. Bring a current list of your medications and current insurance cards. 7. You MUST have a responsible person to drive you home. 8. Someone MUST be with you the first 24 hours after you arrive home or your discharge will be delayed. 9. Please wear clothes that are easy to get on and off and wear slip-on shoes.  Thank you for allowing [place] to care for you!   -- [facility]

## 2018-10-22 LAB — BASIC METABOLIC PANEL
BUN/Creatinine Ratio: 11 (ref 9–23)
BUN: 10 mg/dL (ref 6–24)
CO2: 19 mmol/L — ABNORMAL LOW (ref 20–29)
Calcium: 9.6 mg/dL (ref 8.7–10.2)
Chloride: 102 mmol/L (ref 96–106)
Creatinine, Ser: 0.91 mg/dL (ref 0.57–1.00)
GFR calc Af Amer: 84 mL/min/{1.73_m2} (ref >59)
GFR calc non Af Amer: 73 mL/min/{1.73_m2} (ref >59)
Glucose: 85 mg/dL (ref 65–99)
Potassium: 5.3 mmol/L — ABNORMAL HIGH (ref 3.5–5.2)
Sodium: 141 mmol/L (ref 134–144)

## 2018-10-22 LAB — CBC
Hematocrit: 26.8 % — ABNORMAL LOW (ref 34.0–46.6)
Hemoglobin: 7.2 g/dL — ABNORMAL LOW (ref 11.1–15.9)
MCH: 19.2 pg — ABNORMAL LOW (ref 26.6–33.0)
MCHC: 26.9 g/dL — ABNORMAL LOW (ref 31.5–35.7)
MCV: 72 fL — ABNORMAL LOW (ref 79–97)
Platelets: 546 10*3/uL — ABNORMAL HIGH (ref 150–450)
RBC: 3.75 x10E6/uL — ABNORMAL LOW (ref 3.77–5.28)
RDW: 16.5 % — ABNORMAL HIGH (ref 11.7–15.4)
WBC: 10.7 10*3/uL (ref 3.4–10.8)

## 2018-10-23 ENCOUNTER — Telehealth: Payer: Self-pay | Admitting: *Deleted

## 2018-10-23 ENCOUNTER — Encounter: Payer: Self-pay | Admitting: *Deleted

## 2018-10-23 ENCOUNTER — Encounter (HOSPITAL_COMMUNITY): Payer: Self-pay

## 2018-10-23 ENCOUNTER — Other Ambulatory Visit: Payer: Self-pay

## 2018-10-23 ENCOUNTER — Observation Stay (HOSPITAL_COMMUNITY)
Admission: EM | Admit: 2018-10-23 | Discharge: 2018-10-24 | Disposition: A | Discharge status: Home / Self Care | Payer: 59 | Attending: Internal Medicine | Admitting: Internal Medicine

## 2018-10-23 DIAGNOSIS — Z7902 Long term (current) use of antithrombotics/antiplatelets: Secondary | ICD-10-CM | POA: Diagnosis not present

## 2018-10-23 DIAGNOSIS — I2510 Atherosclerotic heart disease of native coronary artery without angina pectoris: Secondary | ICD-10-CM

## 2018-10-23 DIAGNOSIS — Z791 Long term (current) use of non-steroidal anti-inflammatories (NSAID): Secondary | ICD-10-CM | POA: Insufficient documentation

## 2018-10-23 DIAGNOSIS — Z955 Presence of coronary angioplasty implant and graft: Secondary | ICD-10-CM | POA: Diagnosis not present

## 2018-10-23 DIAGNOSIS — I739 Peripheral vascular disease, unspecified: Secondary | ICD-10-CM | POA: Diagnosis not present

## 2018-10-23 DIAGNOSIS — E785 Hyperlipidemia, unspecified: Secondary | ICD-10-CM

## 2018-10-23 DIAGNOSIS — D649 Anemia, unspecified: Secondary | ICD-10-CM | POA: Diagnosis present

## 2018-10-23 DIAGNOSIS — N289 Disorder of kidney and ureter, unspecified: Secondary | ICD-10-CM | POA: Diagnosis not present

## 2018-10-23 DIAGNOSIS — F1220 Cannabis dependence, uncomplicated: Secondary | ICD-10-CM | POA: Diagnosis not present

## 2018-10-23 DIAGNOSIS — Z8249 Family history of ischemic heart disease and other diseases of the circulatory system: Secondary | ICD-10-CM | POA: Diagnosis not present

## 2018-10-23 DIAGNOSIS — R9431 Abnormal electrocardiogram [ECG] [EKG]: Secondary | ICD-10-CM | POA: Insufficient documentation

## 2018-10-23 DIAGNOSIS — E559 Vitamin D deficiency, unspecified: Secondary | ICD-10-CM | POA: Insufficient documentation

## 2018-10-23 DIAGNOSIS — I10 Essential (primary) hypertension: Secondary | ICD-10-CM | POA: Diagnosis not present

## 2018-10-23 DIAGNOSIS — Z720 Tobacco use: Secondary | ICD-10-CM

## 2018-10-23 DIAGNOSIS — K649 Unspecified hemorrhoids: Secondary | ICD-10-CM

## 2018-10-23 DIAGNOSIS — Z1159 Encounter for screening for other viral diseases: Secondary | ICD-10-CM | POA: Diagnosis not present

## 2018-10-23 DIAGNOSIS — Z9861 Coronary angioplasty status: Secondary | ICD-10-CM

## 2018-10-23 DIAGNOSIS — F17210 Nicotine dependence, cigarettes, uncomplicated: Secondary | ICD-10-CM | POA: Diagnosis not present

## 2018-10-23 DIAGNOSIS — Z7982 Long term (current) use of aspirin: Secondary | ICD-10-CM | POA: Diagnosis not present

## 2018-10-23 DIAGNOSIS — Z79899 Other long term (current) drug therapy: Secondary | ICD-10-CM | POA: Diagnosis not present

## 2018-10-23 DIAGNOSIS — R000 Tachycardia, unspecified: Secondary | ICD-10-CM | POA: Diagnosis not present

## 2018-10-23 DIAGNOSIS — D72829 Elevated white blood cell count, unspecified: Secondary | ICD-10-CM

## 2018-10-23 LAB — COMPREHENSIVE METABOLIC PANEL
ALT: 9 U/L (ref 0–44)
AST: 16 U/L (ref 15–41)
Albumin: 4 g/dL (ref 3.5–5.0)
Alkaline Phosphatase: 62 U/L (ref 38–126)
Anion gap: 14 (ref 5–15)
BUN: 11 mg/dL (ref 6–20)
CO2: 18 mmol/L — ABNORMAL LOW (ref 22–32)
Calcium: 9.4 mg/dL (ref 8.9–10.3)
Chloride: 104 mmol/L (ref 98–111)
Creatinine, Ser: 1.08 mg/dL — ABNORMAL HIGH (ref 0.44–1.00)
GFR calc Af Amer: >60 mL/min (ref >60)
GFR calc non Af Amer: 59 mL/min — ABNORMAL LOW (ref >60)
Glucose, Bld: 121 mg/dL — ABNORMAL HIGH (ref 70–99)
Potassium: 3.7 mmol/L (ref 3.5–5.1)
Sodium: 136 mmol/L (ref 135–145)
Total Bilirubin: 0.4 mg/dL (ref 0.3–1.2)
Total Protein: 7.6 g/dL (ref 6.5–8.1)

## 2018-10-23 LAB — CBC WITH DIFFERENTIAL/PLATELET
Abs Immature Granulocytes: 0.05 10*3/uL (ref 0.00–0.07)
Basophils Absolute: 0 10*3/uL (ref 0.0–0.1)
Basophils Relative: 0 %
Eosinophils Absolute: 0.3 10*3/uL (ref 0.0–0.5)
Eosinophils Relative: 3 %
HCT: 25.8 % — ABNORMAL LOW (ref 36.0–46.0)
Hemoglobin: 7 g/dL — ABNORMAL LOW (ref 12.0–15.0)
Immature Granulocytes: 0 %
Lymphocytes Relative: 17 %
Lymphs Abs: 2 10*3/uL (ref 0.7–4.0)
MCH: 19.6 pg — ABNORMAL LOW (ref 26.0–34.0)
MCHC: 27.1 g/dL — ABNORMAL LOW (ref 30.0–36.0)
MCV: 72.1 fL — ABNORMAL LOW (ref 80.0–100.0)
Monocytes Absolute: 0.7 10*3/uL (ref 0.1–1.0)
Monocytes Relative: 6 %
Neutro Abs: 8.6 10*3/uL — ABNORMAL HIGH (ref 1.7–7.7)
Neutrophils Relative %: 74 %
Platelets: 515 10*3/uL — ABNORMAL HIGH (ref 150–400)
RBC: 3.58 MIL/uL — ABNORMAL LOW (ref 3.87–5.11)
RDW: 17.3 % — ABNORMAL HIGH (ref 11.5–15.5)
WBC: 11.6 10*3/uL — ABNORMAL HIGH (ref 4.0–10.5)
nRBC: 0.3 % — ABNORMAL HIGH (ref 0.0–0.2)

## 2018-10-23 LAB — RETICULOCYTES
Immature Retic Fract: 33 % — ABNORMAL HIGH (ref 2.3–15.9)
RBC.: 3.47 MIL/uL — ABNORMAL LOW (ref 3.87–5.11)
Retic Count, Absolute: 60.4 10*3/uL (ref 19.0–186.0)
Retic Ct Pct: 1.7 % (ref 0.4–3.1)

## 2018-10-23 LAB — URINALYSIS, ROUTINE W REFLEX MICROSCOPIC
Bilirubin Urine: NEGATIVE
Glucose, UA: NEGATIVE mg/dL
Ketones, ur: NEGATIVE mg/dL
Leukocytes,Ua: NEGATIVE
Nitrite: NEGATIVE
Protein, ur: NEGATIVE mg/dL
Specific Gravity, Urine: 1.004 — ABNORMAL LOW (ref 1.005–1.030)
pH: 6 (ref 5.0–8.0)

## 2018-10-23 LAB — ABO/RH: ABO/RH(D): A POS

## 2018-10-23 LAB — HEMOGLOBIN AND HEMATOCRIT, BLOOD
HCT: 27.7 % — ABNORMAL LOW (ref 36.0–46.0)
Hemoglobin: 8.1 g/dL — ABNORMAL LOW (ref 12.0–15.0)

## 2018-10-23 LAB — SARS CORONAVIRUS 2 BY RT PCR (HOSPITAL ORDER, PERFORMED IN ~~LOC~~ HOSPITAL LAB): SARS Coronavirus 2: NEGATIVE

## 2018-10-23 LAB — PREPARE RBC (CROSSMATCH)

## 2018-10-23 LAB — VITAMIN B12: Vitamin B-12: 129 pg/mL — ABNORMAL LOW (ref 180–914)

## 2018-10-23 LAB — IRON AND TIBC
Iron: 9 ug/dL — ABNORMAL LOW (ref 28–170)
Saturation Ratios: 2 % — ABNORMAL LOW (ref 10.4–31.8)
TIBC: 486 ug/dL — ABNORMAL HIGH (ref 250–450)
UIBC: 477 ug/dL

## 2018-10-23 LAB — FOLATE: Folate: 7.2 ng/mL (ref >5.9)

## 2018-10-23 LAB — FERRITIN: Ferritin: 3 ng/mL — ABNORMAL LOW (ref 11–307)

## 2018-10-23 MED ORDER — ONDANSETRON HCL 4 MG PO TABS: 4.0000 mg | ORAL_TABLET | Freq: Four times a day (QID) | ORAL | Status: DC | PRN

## 2018-10-23 MED ORDER — SODIUM CHLORIDE 0.9% FLUSH
3.0000 mL | Freq: Two times a day (BID) | INTRAVENOUS | Status: DC
  Administered 2018-10-23 – 2018-10-24 (×2): 3 mL via INTRAVENOUS

## 2018-10-23 MED ORDER — CARVEDILOL 3.125 MG PO TABS
3.1250 mg | ORAL_TABLET | Freq: Every evening | ORAL | Status: DC
  Administered 2018-10-23: 3.125 mg via ORAL
  Filled 2018-10-23: qty 1

## 2018-10-23 MED ORDER — ACETAMINOPHEN 325 MG PO TABS
650.0000 mg | ORAL_TABLET | Freq: Four times a day (QID) | ORAL | Status: DC | PRN
  Administered 2018-10-24: 650 mg via ORAL
  Filled 2018-10-23: qty 2

## 2018-10-23 MED ORDER — ASPIRIN 81 MG PO CHEW
81.0000 mg | CHEWABLE_TABLET | Freq: Every evening | ORAL | Status: DC
  Administered 2018-10-23: 81 mg via ORAL
  Filled 2018-10-23: qty 1

## 2018-10-23 MED ORDER — GABAPENTIN 300 MG PO CAPS
600.0000 mg | ORAL_CAPSULE | Freq: Two times a day (BID) | ORAL | Status: DC
  Administered 2018-10-23 – 2018-10-24 (×2): 600 mg via ORAL
  Filled 2018-10-23 (×2): qty 2

## 2018-10-23 MED ORDER — ACETAMINOPHEN 650 MG RE SUPP: 650.0000 mg | Freq: Four times a day (QID) | RECTAL | Status: DC | PRN

## 2018-10-23 MED ORDER — LISINOPRIL 5 MG PO TABS
5.0000 mg | ORAL_TABLET | Freq: Every evening | ORAL | Status: DC
  Administered 2018-10-23: 5 mg via ORAL
  Filled 2018-10-23: qty 1

## 2018-10-23 MED ORDER — NITROGLYCERIN 0.4 MG SL SUBL: 0.4000 mg | SUBLINGUAL_TABLET | SUBLINGUAL | Status: DC | PRN

## 2018-10-23 MED ORDER — ATORVASTATIN CALCIUM 40 MG PO TABS
40.0000 mg | ORAL_TABLET | Freq: Every evening | ORAL | Status: DC
  Administered 2018-10-23: 40 mg via ORAL
  Filled 2018-10-23: qty 1

## 2018-10-23 MED ORDER — SODIUM CHLORIDE 0.9% IV SOLUTION
Freq: Once | INTRAVENOUS | Status: AC
  Administered 2018-10-23: 17:00:00 via INTRAVENOUS

## 2018-10-23 MED ORDER — FAMOTIDINE 20 MG PO TABS
20.0000 mg | ORAL_TABLET | Freq: Every day | ORAL | Status: DC
  Administered 2018-10-23: 20 mg via ORAL
  Filled 2018-10-23: qty 1

## 2018-10-23 MED ORDER — ONDANSETRON HCL 4 MG/2ML IJ SOLN: 4.0000 mg | Freq: Four times a day (QID) | INTRAMUSCULAR | Status: DC | PRN

## 2018-10-23 MED ORDER — HYDROCORTISONE ACETATE 25 MG RE SUPP
25.0000 mg | Freq: Two times a day (BID) | RECTAL | Status: DC
  Administered 2018-10-23 – 2018-10-24 (×2): 25 mg via RECTAL
  Filled 2018-10-23 (×2): qty 1

## 2018-10-23 NOTE — Plan of Care (Signed)
  Problem: Activity: Goal: Risk for activity intolerance will decrease Outcome: Adequate for Discharge   Problem: Nutrition: Goal: Adequate nutrition will be maintained Outcome: Completed/Met   Problem: Coping: Goal: Level of anxiety will decrease Outcome: Completed/Met   Problem: Safety: Goal: Ability to remain free from injury will improve Outcome: Completed/Met

## 2018-10-23 NOTE — Telephone Encounter (Signed)
Left a message for the patient to call back as soon as possible.  Notes recorded by Wellington Hampshire, MD on 10/23/2018 at 8:00 AM EDT  Inform patient that routine pre-angiogram labs showed severe anemia with a hemoglobin of 7.2. This was normal 8 months ago. She must be bleeding somewhere. I recommend stopping Plavix and continue aspirin 81 mg once daily.  We have to cancel her angiogram procedure due to this.  I am forwarding this to her primary care physician to make her aware. The patient requires urgent full GI evaluation with EGD and colonoscopy.  If she is dizzy or lightheaded, she might need to go to the emergency department for admission and transfusion.

## 2018-10-23 NOTE — ED Triage Notes (Signed)
Dr. Fletcher Anon is the physician that sent this pt in for a blood transfusion d/t her hemoglobin being 7.2 omn her regularly scheduled blodd work (it was >12 six months ago). She claims to have a bleeding fissure & feelings of gerd from time to time.

## 2018-10-23 NOTE — H&P (Signed)
History and Physical    ARMENIA SILVERIA MGQ:[id number] DOB: [birth year] DOA: 10/23/2018  Referring MD/NP/PA: Janeece Fitting, PA-C PCP: Charlott Rakes, MD  Patient coming from: Home  Chief Complaint: Low blood counts  I have personally briefly reviewed patient's old medical records in [place]   HPI: Robin Walsh is a 53 y.o. female with medical history significant of hypertension, hyperlipidemia, coronary artery disease s/p stent, peripheral vascular disease s/p iliac stent, and tobacco abuse; who presents after recent preprocedural lab work for an aortogram with Dr. Fletcher Anon revealed hemoglobin 7.2 g/dL.  She reports having rectal bleeding bright red blood for several months now, but had been acutely worse over the last month.  She had been using preparation H for her hemorrhoids with some mild relief of symptoms.  Associated symptoms include generalized fatigue, claudication, feelings of food getting stuck intermittently, and palpitations with heart rates into the 120s when she would check on her apple watch.  She has never had a colonoscopy before, but was in the process of scheduling it with her primary care provider in the outpatient setting.  Patient would prefer not to have a colonoscopy done in the hospital if possible.  Denies having any lightheadedness, nausea, vomiting, abdominal pain, or loss of consciousness.  She states that Dr. Fletcher Anon had taken her off of Plavix today, but wanted her to continue on aspirin.  ED Course: Upon admission into the emergency department patient was seen to be afebrile, heart rates 95-1 10, respiration 14-21, and all other vital signs stable.  Labs revealed hemoglobin 7, WBC 11.6, platelets 515, BUN 11, and creatinine 1.08.  Patient was typed and screened and ordered to be transfused 1 unit of packed red blood cells.  TRH called to admit.  Review of Systems  Constitutional: Positive for malaise/fatigue. Negative for chills and fever.  HENT: Negative for  ear discharge and nosebleeds.   Eyes: Negative for double vision and photophobia.  Respiratory: Negative for shortness of breath.   Cardiovascular: Positive for claudication. Negative for chest pain and leg swelling.  Gastrointestinal: Positive for blood in stool. Negative for nausea and vomiting.  Genitourinary: Negative for dysuria and hematuria.  Musculoskeletal: Negative for falls.  Skin: Negative for itching and rash.  Neurological: Negative for dizziness, focal weakness and loss of consciousness.  Psychiatric/Behavioral: Negative for hallucinations and suicidal ideas.     Past Medical History:  Diagnosis Date  . CAD (coronary artery disease)   . Hyperlipidemia   . Hypertension     Past Surgical History:  Procedure Laterality Date  . CARDIAC CATHETERIZATION N/A 11/28/2015   Procedure: Left Heart Cath and Coronary Angiography;  Surgeon: Jettie Booze, MD;  Location: [place] [facility];  Service: Cardiovascular;  Laterality: N/A;  . CARDIAC CATHETERIZATION N/A 11/28/2015   Procedure: Coronary Stent Intervention;  Surgeon: Jettie Booze, MD;  Location: [place] [facility];  Service: Cardiovascular;  Laterality: N/A;  DES to Mid LAD; 3.0x12 Synergy  . LEG SURGERY  2002?   tibial plateau fracture  . PERIPHERAL VASCULAR CATHETERIZATION N/A 01/04/2016   Procedure: Abdominal Aortogram;  Surgeon: Nelva Bush, MD;  Location: [place] [facility];  Service: Cardiovascular;  Laterality: N/A;  . PERIPHERAL VASCULAR CATHETERIZATION Bilateral 01/04/2016   Procedure: Lower Extremity Angiography;  Surgeon: Nelva Bush, MD;  Location: [place] [facility];  Service: Cardiovascular;  Laterality: Bilateral;  . PERIPHERAL VASCULAR CATHETERIZATION Bilateral 01/04/2016   Procedure: Peripheral Vascular Intervention;  Surgeon: Nelva Bush, MD;  Location: [facility]  INVASIVE [facility];  Service: Cardiovascular;  Laterality: Bilateral;  common iliac     reports that she has been smoking cigarettes.  She has a 12.50 pack-year smoking history. She has never used smokeless tobacco. She reports current drug use. Drug: Marijuana. She reports that she does not drink alcohol.  Allergies  Allergen Reactions  . Chantix [Varenicline Tartrate] Nausea Only  . Coreg [Carvedilol] Other (See Comments)    Causes dizziness and the B/P to drop too much if taken twice a day  . Codeine Nausea And Vomiting  . Dilaudid [Hydromorphone Hcl] Nausea And Vomiting    Family History  Problem Relation Age of Onset  . Congestive Heart Failure Mother 84  . Alcohol abuse Father 81  . Heart disease Brother   . Liver disease Sister        kidney/liver    Prior to Admission medications   Medication Sig Start Date End Date Taking? Authorizing Provider  albuterol (VENTOLIN HFA) 108 (90 Base) MCG/ACT inhaler INHALE 2 PUFFS INTO THE LUNGS EVERY 6 HOURS AS NEEDED FOR WHEEZING OR SHORTNESS OF BREATH. 03/14/18   Charlott Rakes, MD  aspirin 81 MG chewable tablet Chew 1 tablet (81 mg total) by mouth daily. 02/26/17   Charlott Rakes, MD  atorvastatin (LIPITOR) 40 MG tablet TAKE 1 TABLET BY MOUTH DAILY AT 6 PM. 08/14/18   Charlott Rakes, MD  carvedilol (COREG) 3.125 MG tablet Take 1 tablet (3.125 mg total) by mouth daily. 08/14/18   Charlott Rakes, MD  cetirizine (ZYRTEC) 10 MG tablet Take 1 tablet (10 mg total) by mouth daily. 08/14/18   Charlott Rakes, MD  diclofenac sodium (VOLTAREN) 1 % GEL Apply 4 g topically 4 (four) times daily. 09/25/17   Charlott Rakes, MD  gabapentin (NEURONTIN) 300 MG capsule Take 2 capsules (600 mg total) by mouth 2 (two) times daily. 08/14/18   Charlott Rakes, MD  lisinopril (ZESTRIL) 5 MG tablet Take 1 tablet (5 mg total) by mouth daily. 08/14/18   Charlott Rakes, MD  nitroGLYCERIN (NITROSTAT) 0.4 MG SL tablet Place 1 tablet (0.4 mg total) under the tongue every 5 (five) minutes as needed for chest pain. X 3 doses 12/11/17   Freeman Caldron M, PA-C  tiZANidine (ZANAFLEX) 4 MG tablet Take 1 tablet (4 mg  total) by mouth every 6 (six) hours as needed for muscle spasms. 02/11/18   Charlott Rakes, MD  varenicline (CHANTIX CONTINUING MONTH PAK) 1 MG tablet Take 1 tablet (1 mg total) by mouth 2 (two) times daily. 02/26/17   Charlott Rakes, MD  varenicline (CHANTIX STARTING MONTH PAK) 0.5 MG X 11 & 1 MG X 42 tablet Take one 0.5 mg tablet by mouth once daily for 3 days, then increase to one 0.5 mg tablet twice daily for 4 days, then increase to one 1 mg tablet twice daily. 02/26/17   Charlott Rakes, MD  Vitamin D, Ergocalciferol, (DRISDOL) 50000 units CAPS capsule Take 1 capsule (50,000 Units total) by mouth every 7 (seven) days. 02/11/18   Charlott Rakes, MD    Physical Exam:  Constitutional: Middle-aged female in NAD, calm, comfortable Vitals:   10/23/18 1515 10/23/18 1530 10/23/18 1545 10/23/18 1600  BP: 135/70 131/84 118/62 (!) 151/73  Pulse: 95 (!) 103 100 99  Resp: 15 19 15  (!) 21  SpO2: 99% 99% 99% 99%  Weight:      Height:       Eyes: PERRL, lids and conjunctivae normal ENMT: Mucous membranes are moist. Posterior pharynx clear of any  exudate or lesions.   Neck: normal, supple, no masses, no thyromegaly Respiratory: clear to auscultation bilaterally, no wheezing, no crackles. Normal respiratory effort. No accessory muscle use.  Cardiovascular: Tachycardic, no murmurs / rubs / gallops. No extremity edema.1+ pedal pulses. No carotid bruits.  Abdomen: no tenderness, no masses palpated. No hepatosplenomegaly. Bowel sounds positive.  Rectal examination: Chaperoned by nurse.  Clearly able to visualize external hemorrhoids present. Musculoskeletal: no clubbing / cyanosis. No joint deformity upper and lower extremities. Good ROM, no contractures. Normal muscle tone.  Skin: Mild pallor present.  No rashes, lesions, ulcers. No induration Neurologic: CN 2-12 grossly intact. Sensation intact, DTR normal. Strength 5/5 in all 4.  Psychiatric: Normal judgment and insight. Alert and oriented x 3.  Normal mood.     Labs on Admission: I have personally reviewed following labs and imaging studies  CBC: Recent Labs  Lab 10/21/18 1208 10/23/18 1406  WBC 10.7 11.6*  NEUTROABS  --  8.6*  HGB 7.2* 7.0*  HCT 26.8* 25.8*  MCV 72* 72.1*  PLT 546* 384*   Basic Metabolic Panel: Recent Labs  Lab 10/21/18 1208 10/23/18 1406  NA 141 136  K 5.3* 3.7  CL 102 104  CO2 19* 18*  GLUCOSE 85 121*  BUN 10 11  CREATININE 0.91 1.08*  CALCIUM 9.6 9.4   GFR: Estimated Creatinine Clearance: 60.9 mL/min (A) (by C-G formula based on SCr of 1.08 mg/dL (H)). Liver Function Tests: Recent Labs  Lab 10/23/18 1406  AST 16  ALT 9  ALKPHOS 62  BILITOT 0.4  PROT 7.6  ALBUMIN 4.0   No results for input(s): LIPASE, AMYLASE in the last 168 hours. No results for input(s): AMMONIA in the last 168 hours. Coagulation Profile: No results for input(s): INR, PROTIME in the last 168 hours. Cardiac Enzymes: No results for input(s): CKTOTAL, CKMB, CKMBINDEX, TROPONINI in the last 168 hours. BNP (last 3 results) No results for input(s): PROBNP in the last 8760 hours. HbA1C: No results for input(s): HGBA1C in the last 72 hours. CBG: No results for input(s): GLUCAP in the last 168 hours. Lipid Profile: No results for input(s): CHOL, HDL, LDLCALC, TRIG, CHOLHDL, LDLDIRECT in the last 72 hours. Thyroid Function Tests: No results for input(s): TSH, T4TOTAL, FREET4, T3FREE, THYROIDAB in the last 72 hours. Anemia Panel: Recent Labs    10/23/18 1531  RETICCTPCT 1.7   Urine analysis: No results found for: COLORURINE, APPEARANCEUR, LABSPEC, PHURINE, GLUCOSEU, HGBUR, BILIRUBINUR, KETONESUR, PROTEINUR, UROBILINOGEN, NITRITE, LEUKOCYTESUR Sepsis Labs: No results found for this or any previous visit (from the past 240 hour(s)).   Radiological Exams on Admission: No results found.  EKG: Independently reviewed.  Sinus tachycardia 106 bpm  Assessment/Plan Hemorrhoids with rectal bleeding, symptomatic  anemia: Acute. Patient presents with complaints of intermittent episodes of rectal bleeding.  Hemoglobin down to 7 g/dL on admission previously noted to be 12.8 back in 02/2018.  Patient typed and screened and ordered to be transfused 1 unit packed red blood cells.  Given the low MCV and MCH suspect iron deficiency. She would rather be discharged to follow-up in outpatient setting with gastroenterology for preventative colonoscopy instead of an inpatient colonoscopy. -Admit to a telemetry bed -Check anemia panel stat -Continue with transfusion of 1 unit of packed red blood cells -Recheck H&H posttransfusion -Hydrocortisone suppository -Patient may benefit from a iron infusion after transfusion of blood products -May also benefit from modified barium swallow as well to investigate dysphagia complaint  Leukocytosis: Acute.  WBC elevated at 11.6.  Patient denies having any fever, significant cough, or dysuria symptoms. -Check urinalysis -Recheck CBC in a.m.   Renal insufficiency: Acute.  Creatinine on admission 1.08 and previously just noted to be 0.91.  -Recheck creatinine in a.m.  Essential hypertension -Continue Coreg  Peripheral vascular disease: Patient followed by Dr. Fletcher Anon and has previously had iliac stents placed.  She was scheduled to have an aortogram performed on in August that was recently canceled.  Plavix had just recently been discontinued today. -Continue aspirin and statin  Dyslipidemia -Continue Lipitor  CAD: Patient with previous history of PCI with drug-eluting stent placement to the LAD in 11/2015.  Tobacco abuse: Patient currently in the process of starting on Chantix to help quit smoking. -Counsel on need of cessation of tobacco use  DVT prophylaxis: SCDs Code Status: Full Family Communication: No family present at bedside Disposition Plan: Likely discharge home once medically stable Consults called: None Admission status: Observation  Norval Morton MD  [facility] Pager [phone]   If 7PM-7AM, please contact night-coverage [email or web address] Password [facility]  10/23/2018, 4:43 PM

## 2018-10-23 NOTE — Telephone Encounter (Signed)
-----   Message from Lamar Laundry, RN sent at 10/23/2018  9:15 AM EDT ----- Forwarded to Cristopher Peru., RN Dr.Arida's NL nurse

## 2018-10-23 NOTE — Telephone Encounter (Signed)
Patient made aware of results and verbalized understanding. She will go to the hospital since she is symptomatic. Robin Walsh ED has been notified.  She has been advised to stop the Plavix but stay on the Aspirin.  The procedure has been cancelled until further notice.

## 2018-10-23 NOTE — Progress Notes (Signed)
New Admission Note:   Arrival Method: Bed  Mental Orientation:  Alerts and Oriented  Telemetry: Box 17  Assessment: Completed Skin: Intact  IV: Left Forearm  Pain: 0/10  Tubes: none  Safety Measures: Safety Fall Prevention Plan has been given, discussed and signed Admission: Completed [facility] Orientation: Patient has been orientated to the room, unit and staff.  Family: none   Orders have been reviewed and implemented. Will continue to monitor the patient. Call light has been placed within reach and bed alarm has been activated.   Thailyn Khalid RN [facility] Phone: [phone]

## 2018-10-23 NOTE — Plan of Care (Signed)
°  Problem: Coping: °Goal: Level of anxiety will decrease °Outcome: Progressing °  °

## 2018-10-23 NOTE — ED Provider Notes (Signed)
[facility] Provider Note   CSN: [id number] Arrival date & time: 10/23/18  1343    History   Chief Complaint Chief Complaint  Patient presents with  . Blood Transfusion    HPI Robin Walsh is a 53 y.o. female.     53 y.o female with a PMH of CAD, HTN presents to the ED sent in by Dr. Fletcher Anon due to abnormal laboratory results. Patient was schedule for a procedure by Dr. Fletcher Anon, had her blood work for screening, was found to have a hemoglobin of 7.9, this is significantly decreased from her previous visit which it was around 12.  Patient reports for the past 2 weeks she has had some rectal bleeding, reports witnessing bright red blood on her stool along with the tissue.  She also endorses feeling "crummy ", more fatigued than usual.  Reports she has been measuring her heart rate with her apple iWatch stating that her heart rate is running around the 120s.   In addition, patient reports difficulty swallowing liquids and solids, reports feeling a sensation of something getting stuck in her throat. She denies does have previous cardiac history but states no chest pain and this does not feel "like its not my chest". She denies any fever, weakness, shortness of breath.      Past Medical History:  Diagnosis Date  . CAD (coronary artery disease)   . Hyperlipidemia   . Hypertension     Patient Active Problem List   Diagnosis Date Noted  . Vitamin D deficiency 02/11/2018  . Hot flash, menopausal 02/06/2017  . PAD (peripheral artery disease) ([place]) 01/04/2016  . Medication management 12/09/2015  . Chest pain   . Abnormal finding on EKG - anterior T-wave inversions concerning for LAD ischemia 11/28/2015  . CAD S/P DES PCI TO mLAD - Synergy DES 3.0 mm x 12 mm 11/28/2015  . Unstable angina ([place]) 11/27/2015  . Essential hypertension 11/27/2015  . Dyslipidemia, goal LDL below 70 11/27/2015  . Tobacco dependence 11/27/2015  . Noncompliance  11/27/2015  . Marijuana dependence ([place]) 11/27/2015  . Right hip pain 11/27/2015    Past Surgical History:  Procedure Laterality Date  . CARDIAC CATHETERIZATION N/A 11/28/2015   Procedure: Left Heart Cath and Coronary Angiography;  Surgeon: Jettie Booze, MD;  Location: [facility];  Service: Cardiovascular;  Laterality: N/A;  . CARDIAC CATHETERIZATION N/A 11/28/2015   Procedure: Coronary Stent Intervention;  Surgeon: Jettie Booze, MD;  Location: [facility];  Service: Cardiovascular;  Laterality: N/A;  DES to Mid LAD; 3.0x12 Synergy  . LEG SURGERY  2002?   tibial plateau fracture  . PERIPHERAL VASCULAR CATHETERIZATION N/A 01/04/2016   Procedure: Abdominal Aortogram;  Surgeon: Nelva Bush, MD;  Location: [facility];  Service: Cardiovascular;  Laterality: N/A;  . PERIPHERAL VASCULAR CATHETERIZATION Bilateral 01/04/2016   Procedure: Lower Extremity Angiography;  Surgeon: Nelva Bush, MD;  Location: [facility];  Service: Cardiovascular;  Laterality: Bilateral;  . PERIPHERAL VASCULAR CATHETERIZATION Bilateral 01/04/2016   Procedure: Peripheral Vascular Intervention;  Surgeon: Nelva Bush, MD;  Location: [facility];  Service: Cardiovascular;  Laterality: Bilateral;  common iliac     OB History    Gravida  1   Para  0   Term      Preterm      AB  1   Living        SAB      TAB  1   Ectopic      Multiple      Live Births               Home Medications    Prior to Admission medications   Medication Sig Start Date End Date Taking? Authorizing Provider  albuterol (VENTOLIN HFA) 108 (90 Base) MCG/ACT inhaler INHALE 2 PUFFS INTO THE LUNGS EVERY 6 HOURS AS NEEDED FOR WHEEZING OR SHORTNESS OF BREATH. 03/14/18   Charlott Rakes, MD  aspirin 81 MG chewable tablet Chew 1 tablet (81 mg total) by mouth daily. 02/26/17   Charlott Rakes, MD  atorvastatin (LIPITOR) 40 MG tablet TAKE 1 TABLET BY MOUTH DAILY AT 6 PM. 08/14/18    Charlott Rakes, MD  carvedilol (COREG) 3.125 MG tablet Take 1 tablet (3.125 mg total) by mouth daily. 08/14/18   Charlott Rakes, MD  cetirizine (ZYRTEC) 10 MG tablet Take 1 tablet (10 mg total) by mouth daily. 08/14/18   Charlott Rakes, MD  diclofenac sodium (VOLTAREN) 1 % GEL Apply 4 g topically 4 (four) times daily. 09/25/17   Charlott Rakes, MD  gabapentin (NEURONTIN) 300 MG capsule Take 2 capsules (600 mg total) by mouth 2 (two) times daily. 08/14/18   Charlott Rakes, MD  lisinopril (ZESTRIL) 5 MG tablet Take 1 tablet (5 mg total) by mouth daily. 08/14/18   Charlott Rakes, MD  nitroGLYCERIN (NITROSTAT) 0.4 MG SL tablet Place 1 tablet (0.4 mg total) under the tongue every 5 (five) minutes as needed for chest pain. X 3 doses 12/11/17   Freeman Caldron M, PA-C  tiZANidine (ZANAFLEX) 4 MG tablet Take 1 tablet (4 mg total) by mouth every 6 (six) hours as needed for muscle spasms. 02/11/18   Charlott Rakes, MD  varenicline (CHANTIX CONTINUING MONTH PAK) 1 MG tablet Take 1 tablet (1 mg total) by mouth 2 (two) times daily. 02/26/17   Charlott Rakes, MD  varenicline (CHANTIX STARTING MONTH PAK) 0.5 MG X 11 & 1 MG X 42 tablet Take one 0.5 mg tablet by mouth once daily for 3 days, then increase to one 0.5 mg tablet twice daily for 4 days, then increase to one 1 mg tablet twice daily. 02/26/17   Charlott Rakes, MD  Vitamin D, Ergocalciferol, (DRISDOL) 50000 units CAPS capsule Take 1 capsule (50,000 Units total) by mouth every 7 (seven) days. 02/11/18   Charlott Rakes, MD    Family History Family History  Problem Relation Age of Onset  . Congestive Heart Failure Mother 66  . Alcohol abuse Father 55  . Heart disease Brother   . Liver disease Sister        kidney/liver    Social History Social History   Tobacco Use  . Smoking status: Current Every Day Smoker    Packs/day: 0.50    Years: 25.00    Pack years: 12.50    Types: Cigarettes  . Smokeless tobacco: Never Used  Substance Use Topics  .  Alcohol use: No  . Drug use: Yes    Types: Marijuana    Comment: one week ago     Allergies   Codeine and Dilaudid [hydromorphone hcl]   Review of Systems Review of Systems  Constitutional: Negative for chills and fever.  HENT: Negative for ear pain and sore throat.   Eyes: Negative for pain and visual disturbance.  Respiratory: Positive for shortness of breath. Negative for cough.   Cardiovascular: Negative for chest pain and palpitations.  Gastrointestinal: Positive for blood in stool. Negative for abdominal pain  and vomiting.  Genitourinary: Negative for dysuria and hematuria.  Musculoskeletal: Negative for arthralgias and back pain.  Skin: Negative for color change and rash.  Neurological: Negative for seizures and syncope.  All other systems reviewed and are negative.    Physical Exam Updated Vital Signs BP 122/65   Pulse (!) 108   Resp 19   Ht 5\' 4"  (1.626 m)   Wt 74.4 kg   SpO2 98%   BMI 28.15 kg/m   Physical Exam Vitals signs and nursing note reviewed.  Constitutional:      General: She is not in acute distress.    Appearance: She is well-developed. She is ill-appearing.  HENT:     Head: Normocephalic and atraumatic.     Mouth/Throat:     Pharynx: No oropharyngeal exudate.  Eyes:     Pupils: Pupils are equal, round, and reactive to light.  Neck:     Musculoskeletal: Normal range of motion.  Cardiovascular:     Rate and Rhythm: Regular rhythm. Tachycardia present.     Heart sounds: Normal heart sounds.  Pulmonary:     Effort: Pulmonary effort is normal. No respiratory distress.     Breath sounds: Normal breath sounds. No decreased breath sounds, wheezing or rhonchi.  Chest:     Chest wall: No tenderness.  Abdominal:     General: Bowel sounds are normal. There is no distension.     Palpations: Abdomen is soft.     Tenderness: There is no abdominal tenderness.  Musculoskeletal:        General: No tenderness or deformity.     Right lower leg: No  edema.     Left lower leg: No edema.  Skin:    General: Skin is warm and dry.  Neurological:     Mental Status: She is alert and oriented to person, place, and time.      ED Treatments / Results  Labs (all labs ordered are listed, but only abnormal results are displayed) Labs Reviewed  CBC WITH DIFFERENTIAL/PLATELET - Abnormal; Notable for the following components:      Result Value   WBC 11.6 (*)    RBC 3.58 (*)    Hemoglobin 7.0 (*)    HCT 25.8 (*)    MCV 72.1 (*)    MCH 19.6 (*)    MCHC 27.1 (*)    RDW 17.3 (*)    Platelets 515 (*)    nRBC 0.3 (*)    Neutro Abs 8.6 (*)    All other components within normal limits  COMPREHENSIVE METABOLIC PANEL - Abnormal; Notable for the following components:   CO2 18 (*)    Glucose, Bld 121 (*)    Creatinine, Ser 1.08 (*)    GFR calc non Af Amer 59 (*)    All other components within normal limits  SARS CORONAVIRUS 2 (HOSPITAL ORDER, [facility])  POC OCCULT BLOOD, ED  TYPE AND SCREEN  ABO/RH  PREPARE RBC (CROSSMATCH)    EKG EKG Interpretation  Date/Time:  Thursday October 23 2018 14:24:40 EDT Ventricular Rate:  106 PR Interval:    QRS Duration: 82 QT Interval:  331 QTC Calculation: 440 R Axis:   62 Text Interpretation:  Sinus tachycardia Baseline wander in lead(s) V4 Confirmed by Lennice Sites [phone]) on 10/23/2018 2:35:04 PM   Radiology No results found.  Procedures .Critical Care Performed by: Janeece Fitting, PA-C Authorized by: Janeece Fitting, PA-C   Critical care provider statement:  Critical care time (minutes):  35   Critical care start time:  10/23/2018 2:30 PM   Critical care end time:  10/23/2018 3:05 PM   Critical care was necessary to treat or prevent imminent or life-threatening deterioration of the following conditions:  Circulatory failure   Critical care was time spent personally by me on the following activities:  Discussions with consultants, evaluation of patient's response to  treatment, examination of patient, ordering and performing treatments and interventions, ordering and review of laboratory studies, ordering and review of radiographic studies, pulse oximetry, re-evaluation of patient's condition, obtaining history from patient or surrogate, review of old charts and blood draw for specimens   (including critical care time)  Medications Ordered in ED Medications  0.9 %  sodium chloride infusion (Manually program via Guardrails IV Fluids) (has no administration in time range)     Initial Impression / Assessment and Plan / ED Course  I have reviewed the triage vital signs and the nursing notes.  Pertinent labs & imaging results that were available during my care of the patient were reviewed by me and considered in my medical decision making (see chart for details).    Patient with a past medical history of CAD presents to the ED for blood transfusion peer seen by Dr. Fletcher Anon in office today, had preop labs placed for procedure scheduled on 5 August, noted to have a decreasing hemoglobin from 12-7 on her blood work from the 12th of this month.  I read in the ED with increased fatigue, feeling overall crummy.  Does have some rectal bleeding, reports bright red stools with wiping along with on the tissue. States she has been checking her heart rate around the 120s while at rest, reports feeling more fatigued.\  CMP showed no electrolyte abnormality, creatinine level slightly increased at 1.08.  CBC shows slight leukocytosis, hemoglobin level today is 7.  Will collect Hemoccult sample.  We will also initiate transfusion for likely symptomatic anemia at this time with a hemoglobin of 7.  Suspect patient will likely need admission for further management.  Call placed for hospitalist admission.   3:26 PM Spoke to Dr. Tamala Julian who will admit patient for further management, I have switched my order to transfuse 1 unit as there is a shortage of blood according to him.  Will now  transfuse 1 unit of blood.  Portions of this note were generated with Lobby[employer]. Dictation errors may occur despite best attempts at proofreading.   Final Clinical Impressions(s) / ED Diagnoses   Final diagnoses:  Symptomatic anemia    ED Discharge Orders    None       Janeece Fitting, PA-C 10/23/18 [place], [place], DO 10/24/18 [phone]

## 2018-10-24 DIAGNOSIS — D649 Anemia, unspecified: Principal | ICD-10-CM

## 2018-10-24 LAB — CBC
HCT: 29.7 % — ABNORMAL LOW (ref 36.0–46.0)
Hemoglobin: 8.7 g/dL — ABNORMAL LOW (ref 12.0–15.0)
MCH: 21.4 pg — ABNORMAL LOW (ref 26.0–34.0)
MCHC: 29.3 g/dL — ABNORMAL LOW (ref 30.0–36.0)
MCV: 73 fL — ABNORMAL LOW (ref 80.0–100.0)
Platelets: 475 10*3/uL — ABNORMAL HIGH (ref 150–400)
RBC: 4.07 MIL/uL (ref 3.87–5.11)
RDW: 18.6 % — ABNORMAL HIGH (ref 11.5–15.5)
WBC: 9.4 10*3/uL (ref 4.0–10.5)
nRBC: 0 % (ref 0.0–0.2)

## 2018-10-24 LAB — BASIC METABOLIC PANEL
Anion gap: 9 (ref 5–15)
BUN: 10 mg/dL (ref 6–20)
CO2: 22 mmol/L (ref 22–32)
Calcium: 9.3 mg/dL (ref 8.9–10.3)
Chloride: 107 mmol/L (ref 98–111)
Creatinine, Ser: 0.99 mg/dL (ref 0.44–1.00)
GFR calc Af Amer: >60 mL/min (ref >60)
GFR calc non Af Amer: >60 mL/min (ref >60)
Glucose, Bld: 102 mg/dL — ABNORMAL HIGH (ref 70–99)
Potassium: 4.1 mmol/L (ref 3.5–5.1)
Sodium: 138 mmol/L (ref 135–145)

## 2018-10-24 MED ORDER — WHITE PETROLATUM EX OINT
TOPICAL_OINTMENT | CUTANEOUS | Status: AC
  Administered 2018-10-24: 0.2
  Filled 2018-10-24: qty 28.35

## 2018-10-24 MED ORDER — CYANOCOBALAMIN 1000 MCG/ML IJ SOLN
1000.0000 ug | Freq: Once | INTRAMUSCULAR | Status: AC
  Administered 2018-10-24: 1000 ug via INTRAMUSCULAR
  Filled 2018-10-24: qty 1

## 2018-10-24 MED ORDER — HYDROCORTISONE ACETATE 25 MG RE SUPP: 25.0000 mg | Freq: Two times a day (BID) | RECTAL | 0 refills | Status: DC

## 2018-10-24 MED ORDER — VITAMIN B-12 1000 MCG PO TABS: 1000.0000 ug | ORAL_TABLET | Freq: Every day | ORAL | 0 refills | Status: AC

## 2018-10-24 MED ORDER — FERROUS SULFATE 325 (65 FE) MG PO TBEC: 325.0000 mg | DELAYED_RELEASE_TABLET | Freq: Two times a day (BID) | ORAL | 3 refills | Status: DC

## 2018-10-24 MED FILL — FERROUS SULFATE 325 MG TAB: 325 (65 FE) | 30 days supply | Qty: 60 | Fill #0

## 2018-10-24 MED FILL — HYDROCORTISONE ACETATE 25 M: 25 | 6 days supply | Qty: 12 | Fill #0

## 2018-10-24 NOTE — Discharge Summary (Signed)
Physician Discharge Summary  Robin Walsh PTW:[id number] DOB: 09-20-1965 DOA: 10/23/2018  PCP: Charlott Rakes, MD  Admit date: 10/23/2018 Discharge date: 10/24/2018  Admitted From: home  Disposition:  Home   Recommendations for Outpatient Follow-up:  1. Follow up with PCP in 1-2 weeks 2. Please obtain BMP/CBC in one week 3. Keep up your appointment with gastroenterology   Home Health: Not applicable Equipment/Devices: Not applicable  Discharge Condition: Stable CODE STATUS: Full code Diet recommendation: Low-salt diet  Brief/Interim Summary: Admission Hand P: Robin Walsh is a 53 y.o. female with medical history significant of hypertension, hyperlipidemia, coronary artery disease s/p stent, peripheral vascular disease s/p iliac stent, and tobacco abuse; who presented after recent preprocedural lab work for an aortogram with Dr. Fletcher Anon revealed hemoglobin 7.2 g/dL.  She reports having rectal bleeding bright red blood for several months now, but had been acutely worse over the last month.  She had been using preparation H for her hemorrhoids with some mild relief of symptoms.  Associated symptoms include generalized fatigue, claudication, feelings of food getting stuck intermittently, and palpitations with heart rates into the 120s when she would check on her apple watch.  She has never had a colonoscopy before, but was in the process of scheduling it with her primary care provider in the outpatient setting.  Patient would prefer not to have a colonoscopy done in the hospital if possible.  Denies having any lightheadedness, nausea, vomiting, abdominal pain, or loss of consciousness.  She states that Dr. Fletcher Anon had taken her off of Plavix today, but wanted her to continue on aspirin.  ED Course: Upon admission into the emergency department patient was seen to be afebrile, heart rates 95-1 10, respiration 14-21, and all other vital signs stable.  Labs revealed hemoglobin 7, WBC 11.6,  platelets 515, BUN 11, and creatinine 1.08.  Patient was typed and screened and ordered to be transfused 1 unit of packed red blood cells.  TRH called to admit.   Discharge Diagnoses:  Principal Problem:   Symptomatic anemia Active Problems:   Essential hypertension   Dyslipidemia, goal LDL below 70   CAD S/P DES PCI TO mLAD - Synergy DES 3.0 mm x 12 mm   PAD (peripheral artery disease) (HCC)   Hemorrhoids   Leukocytosis   Renal insufficiency   Tobacco abuse  Hemorrhoids with rectal bleeding, symptomatic anemia: Patient was monitored in the hospital.  She was given 1 units of PRBC.  Hemoglobin appropriately responded from 7-8.7.  There is no evidence of ongoing bleeding.  She does have evidence of external hemorrhoids that were not bleeding now. Iron 9, ferritin was 3, folate was normal.  B12 was 129. Patient does have evidence of iron deficiency anemia and also B12 deficiency.  Plan: -Hemoglobin is stable with no active bleeding.  Patient desires to keep up her appointment with gastroenterology that is scheduled in 2 weeks time.  She will need both EGD and colonoscopy and patient will follow-up for the procedure. -Patient will start taking ferrous sulfate twice a day. -1 dose of B12 1 g intramuscular in the hospital before discharge, she will be discharged on oral B12 1 g daily. -Smoking cessation counseled.  She is starting Chantix that was prescribed. -She will continue to take aspirin, however holding Plavix until colonoscopy.  Discharge Instructions  Discharge Instructions    Call MD for:  persistant dizziness or light-headedness   Complete by: As directed    Diet - low sodium heart healthy  Complete by: As directed    Discharge instructions   Complete by: As directed    Keep up your appointment with gastroenterology . You may need both upper and lower GI endoscopies.   Increase activity slowly   Complete by: As directed      Allergies as of 10/24/2018      Reactions    Chantix [varenicline Tartrate] Nausea Only   Coreg [carvedilol] Other (See Comments)   Causes dizziness and the B/P to drop too much if taken twice a day   Codeine Nausea And Vomiting   Dilaudid [hydromorphone Hcl] Nausea And Vomiting      Medication List    STOP taking these medications   cetirizine 10 MG tablet Commonly known as: ZYRTEC   diclofenac sodium 1 % Gel Commonly known as: Voltaren   tiZANidine 4 MG tablet Commonly known as: Zanaflex   Vitamin D (Ergocalciferol) 1.25 MG (50000 UT) Caps capsule Commonly known as: DRISDOL     TAKE these medications   acetaminophen 500 MG tablet Commonly known as: TYLENOL Take 500 mg by mouth every 6 (six) hours as needed (for headaches).   albuterol 108 (90 Base) MCG/ACT inhaler Commonly known as: Ventolin HFA INHALE 2 PUFFS INTO THE LUNGS EVERY 6 HOURS AS NEEDED FOR WHEEZING OR SHORTNESS OF BREATH. What changed:   how much to take  how to take this  when to take this  reasons to take this  additional instructions   aspirin 81 MG chewable tablet Chew 1 tablet (81 mg total) by mouth daily. What changed: when to take this   atorvastatin 40 MG tablet Commonly known as: LIPITOR TAKE 1 TABLET BY MOUTH DAILY AT 6 PM. What changed:   how much to take  how to take this  when to take this  additional instructions   carvedilol 3.125 MG tablet Commonly known as: COREG Take 1 tablet (3.125 mg total) by mouth daily. What changed: when to take this   ferrous sulfate 325 (65 FE) MG EC tablet Take 1 tablet (325 mg total) by mouth 2 (two) times daily.   gabapentin 300 MG capsule Commonly known as: NEURONTIN Take 2 capsules (600 mg total) by mouth 2 (two) times daily.   hydrocortisone 25 MG suppository Commonly known as: ANUSOL-HC Place 1 suppository (25 mg total) rectally 2 (two) times daily.   lisinopril 5 MG tablet Commonly known as: ZESTRIL Take 1 tablet (5 mg total) by mouth daily. What changed: when to  take this   nitroGLYCERIN 0.4 MG SL tablet Commonly known as: NITROSTAT Place 1 tablet (0.4 mg total) under the tongue every 5 (five) minutes as needed for chest pain. X 3 doses   Pepcid Complete 10-800-165 MG chewable tablet Generic drug: famotidine-calcium carbonate-magnesium hydroxide Chew 1 tablet by mouth daily as needed (for indigestion).   varenicline 0.5 MG X 11 & 1 MG X 42 tablet Commonly known as: Chantix Starting Month Pak Take one 0.5 mg tablet by mouth once daily for 3 days, then increase to one 0.5 mg tablet twice daily for 4 days, then increase to one 1 mg tablet twice daily.   varenicline 1 MG tablet Commonly known as: Chantix Continuing Month Pak Take 1 tablet (1 mg total) by mouth 2 (two) times daily.   vitamin B-12 1000 MCG tablet Commonly known as: CYANOCOBALAMIN Take 1 tablet (1,000 mcg total) by mouth daily.       Allergies  Allergen Reactions  . Chantix [Varenicline Tartrate] Nausea Only  .  Coreg [Carvedilol] Other (See Comments)    Causes dizziness and the B/P to drop too much if taken twice a day  . Codeine Nausea And Vomiting  . Dilaudid [Hydromorphone Hcl] Nausea And Vomiting    Consultations:  None    Procedures/Studies:  No results found.    Subjective: Patient was seen and examined.  No overnight events.  Tolerated blood transfusion well.  We discussed about findings regarding iron deficiency, B12 recency. Patient eager to go home.  She has appointment with gastroenterology on 11/10/2018 and she wants to have procedure done as outpatient.   Discharge Exam: Vitals:   10/24/18 0456 10/24/18 0901  BP: (!) 111/53 119/73  Pulse: 99 93  Resp: 16 18  Temp: 98.1 F (36.7 C) 98.2 F (36.8 C)  SpO2: 100% 99%   Vitals:   10/23/18 1947 10/23/18 2124 10/24/18 0456 10/24/18 0901  BP: 113/60 (!) 94/45 (!) 111/53 119/73  Pulse: 89 94 99 93  Resp: 18 15 16 18   Temp: 98.4 F (36.9 C) 98.2 F (36.8 C) 98.1 F (36.7 C) 98.2 F (36.8 C)   TempSrc: Oral Oral Oral Oral  SpO2: 100% 100% 100% 99%  Weight:  74.1 kg    Height:        General: Pt is alert, awake, not in acute distress Cardiovascular: RRR, S1/S2 +, no rubs, no gallops Respiratory: CTA bilaterally, no wheezing, no rhonchi Abdominal: Soft, NT, ND, bowel sounds + Extremities: no edema, no cyanosis    The results of significant diagnostics from this hospitalization (including imaging, microbiology, ancillary and laboratory) are listed below for reference.     Microbiology: Recent Results (from the past 240 hour(s))  SARS Coronavirus 2 (CEPHEID - Performed in [place] hospital lab), Hosp Order     Status: None   Collection Time: 10/23/18  4:12 PM   Specimen: Nasopharyngeal Swab  Result Value Ref Range Status   SARS Coronavirus 2 NEGATIVE NEGATIVE Final    Comment: (NOTE) If result is NEGATIVE SARS-CoV-2 target nucleic acids are NOT DETECTED. The SARS-CoV-2 RNA is generally detectable in upper and lower  respiratory specimens during the acute phase of infection. The lowest  concentration of SARS-CoV-2 viral copies this assay can detect is 250  copies / mL. A negative result does not preclude SARS-CoV-2 infection  and should not be used as the sole basis for treatment or other  patient management decisions.  A negative result may occur with  improper specimen collection / handling, submission of specimen other  than nasopharyngeal swab, presence of viral mutation(s) within the  areas targeted by this assay, and inadequate number of viral copies  (<250 copies / mL). A negative result must be combined with clinical  observations, patient history, and epidemiological information. If result is POSITIVE SARS-CoV-2 target nucleic acids are DETECTED. The SARS-CoV-2 RNA is generally detectable in upper and lower  respiratory specimens dur ing the acute phase of infection.  Positive  results are indicative of active infection with SARS-CoV-2.  Clinical   correlation with patient history and other diagnostic information is  necessary to determine patient infection status.  Positive results do  not rule out bacterial infection or co-infection with other viruses. If result is PRESUMPTIVE POSTIVE SARS-CoV-2 nucleic acids MAY BE PRESENT.   A presumptive positive result was obtained on the submitted specimen  and confirmed on repeat testing.  While 2019 novel coronavirus  (SARS-CoV-2) nucleic acids may be present in the submitted sample  additional confirmatory testing may be  necessary for epidemiological  and / or clinical management purposes  to differentiate between  SARS-CoV-2 and other Sarbecovirus currently known to infect humans.  If clinically indicated additional testing with an alternate test  methodology [phone]) is advised. The SARS-CoV-2 RNA is generally  detectable in upper and lower respiratory sp ecimens during the acute  phase of infection. The expected result is Negative. Fact Sheet for Patients:  StrictlyIdeas.no Fact Sheet for Healthcare Providers: BankingDealers.co.za This test is not yet approved or cleared by the [place] FDA and has been authorized for detection and/or diagnosis of SARS-CoV-2 by FDA under an Emergency Use Authorization (EUA).  This EUA will remain in effect (meaning this test can be used) for the duration of the COVID-19 declaration under Section 564(b)(1) of the Act, 21 U.S.C. section 360bbb-3(b)(1), unless the authorization is terminated or revoked sooner. Performed at [facility], [place] [address], [place], [place] [address]      Labs: BNP (last 3 results) No results for input(s): BNP in the last 8760 hours. Basic Metabolic Panel: Recent Labs  Lab 10/21/18 1208 10/23/18 1406 10/24/18 0533  NA 141 136 138  K 5.3* 3.7 4.1  CL 102 104 107  CO2 19* 18* 22  GLUCOSE 85 121* 102*  BUN 10 11 10   CREATININE 0.91 1.08* 0.99  CALCIUM  9.6 9.4 9.3   Liver Function Tests: Recent Labs  Lab 10/23/18 1406  AST 16  ALT 9  ALKPHOS 62  BILITOT 0.4  PROT 7.6  ALBUMIN 4.0   No results for input(s): LIPASE, AMYLASE in the last 168 hours. No results for input(s): AMMONIA in the last 168 hours. CBC: Recent Labs  Lab 10/21/18 1208 10/23/18 1406 10/23/18 2215 10/24/18 0533  WBC 10.7 11.6*  --  9.4  NEUTROABS  --  8.6*  --   --   HGB 7.2* 7.0* 8.1* 8.7*  HCT 26.8* 25.8* 27.7* 29.7*  MCV 72* 72.1*  --  73.0*  PLT 546* 515*  --  475*   Cardiac Enzymes: No results for input(s): CKTOTAL, CKMB, CKMBINDEX, TROPONINI in the last 168 hours. BNP: Invalid input(s): POCBNP CBG: No results for input(s): GLUCAP in the last 168 hours. D-Dimer No results for input(s): DDIMER in the last 72 hours. Hgb A1c No results for input(s): HGBA1C in the last 72 hours. Lipid Profile No results for input(s): CHOL, HDL, LDLCALC, TRIG, CHOLHDL, LDLDIRECT in the last 72 hours. Thyroid function studies No results for input(s): TSH, T4TOTAL, T3FREE, THYROIDAB in the last 72 hours.  Invalid input(s): FREET3 Anemia work up Recent Labs    10/23/18 1531  VITAMINB12 129*  FOLATE 7.2  FERRITIN 3*  TIBC 486*  IRON 9*  RETICCTPCT 1.7   Urinalysis    Component Value Date/Time   COLORURINE STRAW (A) 10/23/2018 1717   APPEARANCEUR HAZY (A) 10/23/2018 1717   LABSPEC 1.004 (L) 10/23/2018 1717   PHURINE 6.0 10/23/2018 1717   GLUCOSEU NEGATIVE 10/23/2018 1717   HGBUR SMALL (A) 10/23/2018 1717   BILIRUBINUR NEGATIVE 10/23/2018 1717   KETONESUR NEGATIVE 10/23/2018 1717   PROTEINUR NEGATIVE 10/23/2018 1717   NITRITE NEGATIVE 10/23/2018 1717   LEUKOCYTESUR NEGATIVE 10/23/2018 1717   Sepsis Labs Invalid input(s): PROCALCITONIN,  WBC,  LACTICIDVEN Microbiology Recent Results (from the past 240 hour(s))  SARS Coronavirus 2 (CEPHEID - Performed in [place] hospital lab), Hosp Order     Status: None   Collection Time: 10/23/18  4:12 PM    Specimen: Nasopharyngeal Swab  Result  Value Ref Range Status   SARS Coronavirus 2 NEGATIVE NEGATIVE Final    Comment: (NOTE) If result is NEGATIVE SARS-CoV-2 target nucleic acids are NOT DETECTED. The SARS-CoV-2 RNA is generally detectable in upper and lower  respiratory specimens during the acute phase of infection. The lowest  concentration of SARS-CoV-2 viral copies this assay can detect is 250  copies / mL. A negative result does not preclude SARS-CoV-2 infection  and should not be used as the sole basis for treatment or other  patient management decisions.  A negative result may occur with  improper specimen collection / handling, submission of specimen other  than nasopharyngeal swab, presence of viral mutation(s) within the  areas targeted by this assay, and inadequate number of viral copies  (<250 copies / mL). A negative result must be combined with clinical  observations, patient history, and epidemiological information. If result is POSITIVE SARS-CoV-2 target nucleic acids are DETECTED. The SARS-CoV-2 RNA is generally detectable in upper and lower  respiratory specimens dur ing the acute phase of infection.  Positive  results are indicative of active infection with SARS-CoV-2.  Clinical  correlation with patient history and other diagnostic information is  necessary to determine patient infection status.  Positive results do  not rule out bacterial infection or co-infection with other viruses. If result is PRESUMPTIVE POSTIVE SARS-CoV-2 nucleic acids MAY BE PRESENT.   A presumptive positive result was obtained on the submitted specimen  and confirmed on repeat testing.  While 2019 novel coronavirus  (SARS-CoV-2) nucleic acids may be present in the submitted sample  additional confirmatory testing may be necessary for epidemiological  and / or clinical management purposes  to differentiate between  SARS-CoV-2 and other Sarbecovirus currently known to infect humans.  If  clinically indicated additional testing with an alternate test  methodology [phone]) is advised. The SARS-CoV-2 RNA is generally  detectable in upper and lower respiratory sp ecimens during the acute  phase of infection. The expected result is Negative. Fact Sheet for Patients:  StrictlyIdeas.no Fact Sheet for Healthcare Providers: BankingDealers.co.za This test is not yet approved or cleared by the [place] FDA and has been authorized for detection and/or diagnosis of SARS-CoV-2 by FDA under an Emergency Use Authorization (EUA).  This EUA will remain in effect (meaning this test can be used) for the duration of the COVID-19 declaration under Section 564(b)(1) of the Act, 21 U.S.C. section 360bbb-3(b)(1), unless the authorization is terminated or revoked sooner. Performed at [facility], [place] [address] [place],  [address]      Time coordinating discharge: 30 minutes  SIGNED:   Barb Merino, MD  [facility] 10/24/2018, 9:33 AM

## 2018-10-24 NOTE — Progress Notes (Signed)
DISCHARGE NOTE HOME Robin Walsh to be discharged Home per MD order. Discussed prescriptions and follow up appointments with the patient. Prescriptions given to patient; medication list explained in detail. Patient verbalized understanding.  Skin clean, dry and intact without evidence of skin break down, no evidence of skin tears noted. IV catheter discontinued intact. Site without signs and symptoms of complications. Dressing and pressure applied. Pt denies pain at the site currently. No complaints noted.  Patient free of lines, drains, and wounds.   An After Visit Summary (AVS) was printed and given to the patient. Patient escorted via wheelchair, and discharged home via private auto.  Arlyss Repress, RN

## 2018-10-27 LAB — TYPE AND SCREEN
ABO/RH(D): A POS
Antibody Screen: NEGATIVE
Unit division: 0
Unit division: 0

## 2018-10-27 LAB — BPAM RBC
Blood Product Expiration Date: 202008062359
Blood Product Expiration Date: 202008062359
ISSUE DATE / TIME: 202007161631
Unit Type and Rh: 6200
Unit Type and Rh: 6200

## 2018-10-28 ENCOUNTER — Telehealth: Payer: Self-pay | Admitting: Internal Medicine

## 2018-10-28 NOTE — Telephone Encounter (Signed)
Pt wanted to go ahead and schedule ECL so she would have appt. Pt scheduled for ECL in the [place] 11/19/18 @3pm . Pt aware of appt.

## 2018-10-28 NOTE — Telephone Encounter (Signed)
Pt is scheduled for OV to discuss EGD/colon.  Pt reported that she was taken off Plavix due to Hgb 8 and had a blood transfusion last Thursday

## 2018-11-08 ENCOUNTER — Other Ambulatory Visit (HOSPITAL_COMMUNITY): Payer: 59

## 2018-11-10 ENCOUNTER — Ambulatory Visit: Payer: 59 | Admitting: Internal Medicine

## 2018-11-10 ENCOUNTER — Encounter: Payer: Self-pay | Admitting: Internal Medicine

## 2018-11-10 VITALS — BP 116/68 | HR 92 | Temp 98.4°F | Ht 65.0 in | Wt 161.4 lb

## 2018-11-10 DIAGNOSIS — K219 Gastro-esophageal reflux disease without esophagitis: Secondary | ICD-10-CM | POA: Diagnosis not present

## 2018-11-10 DIAGNOSIS — K625 Hemorrhage of anus and rectum: Secondary | ICD-10-CM

## 2018-11-10 DIAGNOSIS — D509 Iron deficiency anemia, unspecified: Secondary | ICD-10-CM

## 2018-11-10 MED ORDER — NA SULFATE-K SULFATE-MG SULF 17.5-3.13-1.6 GM/177ML PO SOLN: 1.0000 | Freq: Once | ORAL | 0 refills | Status: AC

## 2018-11-10 MED FILL — SUPREP BOWEL PREP KIT: 17.5-3.13-1 | 1 days supply | Qty: 354 | Fill #0

## 2018-11-10 NOTE — Progress Notes (Signed)
HISTORY OF PRESENT ILLNESS:  Robin Walsh is a 53 y.o. female with a history of coronary artery disease, peripheral vascular disease, hypertension, hyperlipidemia, and tobacco abuse who is referred today by her primary care provider regarding iron deficiency anemia.  Over the past 6 months the patient has noticed progressive fatigue.  She saw cardiologist and was found to be anemic.  Hemoglobin October 21, 2018 was 7.2 with MCV 72 and platelets 546,000.  Ferritin level was 3.  For this she was hospitalized overnight and transfused 1 unit of blood.  Hemoglobin October 24, 2018 was 8.7.  She is currently on iron supplement.  She is sent for GI evaluation of iron deficiency anemia.  Patient does report a 43-month history of intermittent rectal bleeding with most bowel movements.  In mid July her Plavix was stopped and the bleeding was less.  No melena.  She was having some reflux symptoms which have improved since her transfusion.  No dysphasia.  Occasional coughing or choking spell on saliva.  She denies weight loss.  She does not donate blood.  No prior history of GI evaluations or history of GI problems.  She also reports occasional constipation.  REVIEW OF SYSTEMS:  All non-GI ROS negative unless otherwise stated in the HPI except for back pain and fatigue  Past Medical History:  Diagnosis Date  . Anal fissure   . Anemia   . CAD (coronary artery disease)   . Hyperlipidemia   . Hypertension     Past Surgical History:  Procedure Laterality Date  . CARDIAC CATHETERIZATION N/A 11/28/2015   Procedure: Left Heart Cath and Coronary Angiography;  Surgeon: Jettie Booze, MD;  Location: [facility];  Service: Cardiovascular;  Laterality: N/A;  . CARDIAC CATHETERIZATION N/A 11/28/2015   Procedure: Coronary Stent Intervention;  Surgeon: Jettie Booze, MD;  Location: [facility];  Service: Cardiovascular;  Laterality: N/A;  DES to Mid LAD; 3.0x12 Synergy  . LEG SURGERY Left 2002?   tibial plateau fracture  . PERIPHERAL VASCULAR CATHETERIZATION N/A 01/04/2016   Procedure: Abdominal Aortogram;  Surgeon: Nelva Bush, MD;  Location: [facility];  Service: Cardiovascular;  Laterality: N/A;  . PERIPHERAL VASCULAR CATHETERIZATION Bilateral 01/04/2016   Procedure: Lower Extremity Angiography;  Surgeon: Nelva Bush, MD;  Location: [facility];  Service: Cardiovascular;  Laterality: Bilateral;  . PERIPHERAL VASCULAR CATHETERIZATION Bilateral 01/04/2016   Procedure: Peripheral Vascular Intervention;  Surgeon: Nelva Bush, MD;  Location: [facility];  Service: Cardiovascular;  Laterality: Bilateral;  common iliac    Social History Kizzi A Chauca  reports that she has been smoking cigarettes. She has a 12.50 pack-year smoking history. She has never used smokeless tobacco. She reports current drug use. Drug: Marijuana. She reports that she does not drink alcohol.  family history includes Alcohol abuse (age of onset: 46) in her father; Congestive Heart Failure (age of onset: 64) in her mother; Heart disease in her brother; Liver disease in her sister.  Allergies  Allergen Reactions  . Chantix [Varenicline Tartrate] Nausea Only  . Coreg [Carvedilol] Other (See Comments)    Causes dizziness and the B/P to drop too much if taken twice a day  . Codeine Nausea And Vomiting  . Dilaudid [Hydromorphone Hcl] Nausea And Vomiting       PHYSICAL EXAMINATION: Vital signs: BP 116/68 (BP Location: Left Arm, Patient Position: Sitting, Cuff Size: Normal)   Pulse 92   Temp 98.4 F (36.9 C)  Ht 5\' 5"  (1.651 m) Comment: height measured without shoes  Wt 161 lb 6 oz (73.2 kg)   BMI 26.85 kg/m   Constitutional: generally well-appearing, no acute distress Psychiatric: alert and oriented x3, cooperative Eyes: extraocular movements intact, anicteric, conjunctiva pale Mouth: oral pharynx moist, no lesions Neck: supple no lymphadenopathy Cardiovascular: heart  regular rate and rhythm, no murmur Lungs: clear to auscultation bilaterally Abdomen: soft, nontender, nondistended, no obvious ascites, no peritoneal signs, normal bowel sounds, no organomegaly Rectal: Deferred to colonoscopy Extremities: no clubbing, cyanosis, or lower extremity edema bilaterally Skin: no lesions on visible extremities Neuro: No focal deficits.  Cranial nerves intact  ASSESSMENT:  1.  Iron deficiency anemia.  Rule out GI mucosal lesion 2.  71-month history of rectal bleeding.  Rule out neoplasia 3.  Transient GERD symptoms resolved 4.  Multiple medical problems   PLAN:  1.  Schedule colonoscopy and upper endoscopy to evaluate iron deficiency anemia and rectal bleeding.The nature of the procedure, as well as the risks, benefits, and alternatives were carefully and thoroughly reviewed with the patient. Ample time for discussion and questions allowed. The patient understood, was satisfied, and agreed to proceed. 2.  Continue iron supplement 3.  Follow-up blood counts with PCP to assure good response to iron replacement therapy

## 2018-11-10 NOTE — Patient Instructions (Signed)

## 2018-11-12 ENCOUNTER — Encounter (HOSPITAL_COMMUNITY): Payer: Self-pay

## 2018-11-12 ENCOUNTER — Ambulatory Visit (HOSPITAL_COMMUNITY): Admit: 2018-11-12 | Payer: 59 | Admitting: Cardiovascular Disease

## 2018-11-12 SURGERY — ABDOMINAL AORTOGRAM W/LOWER EXTREMITY
Anesthesia: LOCAL

## 2018-11-18 ENCOUNTER — Telehealth: Payer: Self-pay | Admitting: Internal Medicine

## 2018-11-18 NOTE — Telephone Encounter (Signed)
Left message for patient regarding Covid-19 screening questions. °Covid-19 Screening Questions: °  °Do you now or have you had a fever in the last 14 days?  °  °Do you have any respiratory symptoms of shortness of breath or cough now or in the last 14 days?  °  °Do you have any family members or close contacts with diagnosed or suspected Covid-19 in the past 14 days?  °  °Have you been tested for Covid-19 and found to be positive?  °  ° °

## 2018-11-18 NOTE — Telephone Encounter (Signed)
Pt responded "no" to all screening questions °

## 2018-11-19 ENCOUNTER — Other Ambulatory Visit: Payer: Self-pay

## 2018-11-19 ENCOUNTER — Ambulatory Visit (AMBULATORY_SURGERY_CENTER): Payer: 59 | Admitting: Internal Medicine

## 2018-11-19 ENCOUNTER — Encounter: Payer: Self-pay | Admitting: Internal Medicine

## 2018-11-19 VITALS — BP 118/69 | HR 80 | Temp 98.6°F | Resp 19 | Ht 65.0 in | Wt 161.0 lb

## 2018-11-19 DIAGNOSIS — K648 Other hemorrhoids: Secondary | ICD-10-CM | POA: Diagnosis not present

## 2018-11-19 DIAGNOSIS — D122 Benign neoplasm of ascending colon: Secondary | ICD-10-CM

## 2018-11-19 DIAGNOSIS — D509 Iron deficiency anemia, unspecified: Secondary | ICD-10-CM | POA: Diagnosis not present

## 2018-11-19 DIAGNOSIS — K625 Hemorrhage of anus and rectum: Secondary | ICD-10-CM

## 2018-11-19 DIAGNOSIS — K5730 Diverticulosis of large intestine without perforation or abscess without bleeding: Secondary | ICD-10-CM

## 2018-11-19 DIAGNOSIS — K635 Polyp of colon: Secondary | ICD-10-CM

## 2018-11-19 DIAGNOSIS — K319 Disease of stomach and duodenum, unspecified: Secondary | ICD-10-CM | POA: Diagnosis not present

## 2018-11-19 MED ORDER — SODIUM CHLORIDE 0.9 % IV SOLN: 500.0000 mL | Freq: Once | INTRAVENOUS | Status: DC

## 2018-11-19 NOTE — Op Note (Signed)
[facility] Patient Name: Robin Walsh Procedure Date: 11/19/2018 3:27 PM MRN: [id number] Endoscopist: Docia Chuck. Henrene Pastor , MD Age: 53 Referring MD:  Date of Birth: 02/27/1966 Gender: Female Account #: [id number] Procedure:                Colonoscopy with cold snare polypectomy x 1 Indications:              Rectal bleeding, Iron deficiency anemia Medicines:                Monitored Anesthesia Care Procedure:                Pre-Anesthesia Assessment:                           - Prior to the procedure, a History and Physical                            was performed, and patient medications and                            allergies were reviewed. The patient's tolerance of                            previous anesthesia was also reviewed. The risks                            and benefits of the procedure and the sedation                            options and risks were discussed with the patient.                            All questions were answered, and informed consent                            was obtained. Prior Anticoagulants: The patient has                            taken no previous anticoagulant or antiplatelet                            agents. ASA Grade Assessment: III - A patient with                            severe systemic disease. After reviewing the risks                            and benefits, the patient was deemed in                            satisfactory condition to undergo the procedure.                           After obtaining informed consent, the colonoscope  was passed under direct vision. Throughout the                            procedure, the patient's blood pressure, pulse, and                            oxygen saturations were monitored continuously. The                            Colonoscope was introduced through the anus and                            advanced to the the cecum, identified by     appendiceal orifice and ileocecal valve. The                            terminal ileum, ileocecal valve, appendiceal                            orifice, and rectum were photographed. The quality                            of the bowel preparation was excellent. The                            colonoscopy was performed without difficulty. The                            patient tolerated the procedure well. The bowel                            preparation used was SUPREP via split dose                            instruction. Scope In: 3:28:31 PM Scope Out: 3:45:55 PM Scope Withdrawal Time: 0 hours 14 minutes 57 seconds  Total Procedure Duration: 0 hours 17 minutes 24 seconds  Findings:                 The terminal ileum appeared normal.                           4 mm and 8 mm polyps were found in the ascending                            colon. The polyps were removed with a cold snare.                            Resection and retrieval were complete.                           Multiple diverticula were found in the sigmoid                            colon.  Internal hemorrhoids were found during                            retroflexion. The hemorrhoids were large and                            friable.                           The exam was otherwise without abnormality on                            direct and retroflexion views. Complications:            No immediate complications. Estimated blood loss:                            None. Estimated Blood Loss:     Estimated blood loss: none. Impression:               - The examined portion of the ileum was normal.                           - Ascending colon polyps, removed with a cold                            snare. Resected and retrieved.                           - Diverticulosis in the sigmoid colon.                           - Internal hemorrhoids (large and friable disease.                           - The  examination was otherwise normal on direct                            and retroflexion views. Recommendation:           - Repeat colonoscopy in 5 years for surveillance.                           - Patient has a contact number available for                            emergencies. The signs and symptoms of potential                            delayed complications were discussed with the                            patient. Return to normal activities tomorrow.                            Written discharge instructions were provided to the  patient.                           - Resume previous diet.                           - Continue present medications.                           - Await pathology results.                           - EGD today. Please see report regarding findings                            and final recommendations Monica Codd N. Henrene Pastor, MD 11/19/2018 4:02:32 PM This report has been signed electronically.

## 2018-11-19 NOTE — Patient Instructions (Signed)
Discharge instructions given. Handouts on polyps,diverticulosis and hemorrhoids. Resume previous medications. Repeat CBC and ferritin in 6 weeks. Office follow up in 6 weeks. Please  have your blood work obtained a day or 2 before your visit. YOU HAD AN ENDOSCOPIC PROCEDURE TODAY AT [facility]:   Refer to the procedure report that was given to you for any specific questions about what was found during the examination.  If the procedure report does not answer your questions, please call your gastroenterologist to clarify.  If you requested that your care partner not be given the details of your procedure findings, then the procedure report has been included in a sealed envelope for you to review at your convenience later.  YOU SHOULD EXPECT: Some feelings of bloating in the abdomen. Passage of more gas than usual.  Walking can help get rid of the air that was put into your GI tract during the procedure and reduce the bloating. If you had a lower endoscopy (such as a colonoscopy or flexible sigmoidoscopy) you may notice spotting of blood in your stool or on the toilet paper. If you underwent a bowel prep for your procedure, you may not have a normal bowel movement for a few days.  Please Note:  You might notice some irritation and congestion in your nose or some drainage.  This is from the oxygen used during your procedure.  There is no need for concern and it should clear up in a day or so.  SYMPTOMS TO REPORT IMMEDIATELY:   Following lower endoscopy (colonoscopy or flexible sigmoidoscopy):  Excessive amounts of blood in the stool  Significant tenderness or worsening of abdominal pains  Swelling of the abdomen that is new, acute  Fever of 100F or higher   Following upper endoscopy (EGD)  Vomiting of blood or coffee ground material  New chest pain or pain under the shoulder blades  Painful or persistently difficult swallowing  New shortness of breath  Fever of 100F or  higher  Black, tarry-looking stools  For urgent or emergent issues, a gastroenterologist can be reached at any hour by calling [phone].   DIET:  We do recommend a small meal at first, but then you may proceed to your regular diet.  Drink plenty of fluids but you should avoid alcoholic beverages for 24 hours.  ACTIVITY:  You should plan to take it easy for the rest of today and you should NOT DRIVE or use heavy machinery until tomorrow (because of the sedation medicines used during the test).    FOLLOW UP: Our staff will call the number listed on your records 48-72 hours following your procedure to check on you and address any questions or concerns that you may have regarding the information given to you following your procedure. If we do not reach you, we will leave a message.  We will attempt to reach you two times.  During this call, we will ask if you have developed any symptoms of COVID 19. If you develop any symptoms (ie: fever, flu-like symptoms, shortness of breath, cough etc.) before then, please call [phone].  If you test positive for Covid 19 in the 2 weeks post procedure, please call and report this information to [place].    If any biopsies were taken you will be contacted by phone or by letter within the next 1-3 weeks.  Please call us at [phone] if you have not heard about the biopsies in 3 weeks.    SIGNATURES/CONFIDENTIALITY: You  and/or your care partner have signed paperwork which will be entered into your electronic medical record.  These signatures attest to the fact that that the information above on your After Visit Summary has been reviewed and is understood.  Full responsibility of the confidentiality of this discharge information lies with you and/or your care-partner.

## 2018-11-19 NOTE — Progress Notes (Signed)
Pt's states no medical or surgical changes since previsit or office visit. 

## 2018-11-19 NOTE — Progress Notes (Signed)
Called to room to assist during endoscopic procedure.  Patient ID and intended procedure confirmed with present staff. Received instructions for my participation in the procedure from the performing physician.  

## 2018-11-19 NOTE — Op Note (Signed)
[name] Procedure Date: 11/19/2018 3:48 PM MRN: [id number] Endoscopist: Docia Chuck. Henrene Pastor , MD Age: 53 Referring MD:  Date of Birth: 21-Sep-1965 Gender: Female Account #: [id number] Procedure:                Upper GI endoscopy with biopsies Indications:              Iron deficiency anemia Medicines:                Monitored Anesthesia Care Procedure:                Pre-Anesthesia Assessment:                           - Prior to the procedure, a History and Physical                            was performed, and patient medications and                            allergies were reviewed. The patient's tolerance of                            previous anesthesia was also reviewed. The risks                            and benefits of the procedure and the sedation                            options and risks were discussed with the patient.                            All questions were answered, and informed consent                            was obtained. Prior Anticoagulants: The patient has                            taken no previous anticoagulant or antiplatelet                            agents. ASA Grade Assessment: III - A patient with                            severe systemic disease. After reviewing the risks                            and benefits, the patient was deemed in                            satisfactory condition to undergo the procedure.                           After obtaining informed consent, the endoscope was  passed under direct vision. Throughout the                            procedure, the patient's blood pressure, pulse, and                            oxygen saturations were monitored continuously. The                            Endoscope was introduced through the mouth, and                            advanced to the third part of duodenum. The upper                            GI endoscopy was  accomplished without difficulty.                            The patient tolerated the procedure well. Scope In: Scope Out: Findings:                 The esophagus was normal.                           The stomach was normal.                           The examined duodenum was normal. Biopsies for                            histology were taken with a cold forceps for                            evaluation of celiac disease.                           The cardia and gastric fundus were normal on                            retroflexion. Complications:            No immediate complications. Estimated Blood Loss:     Estimated blood loss: none. Impression:               - Normal esophagus.                           - Normal stomach.                           - Normal examined duodenum. Biopsied.                           - No pathology on colonoscopy or upper endoscopy to                            explain iron deficiency anemia. Consider large  friable hemorrhoids as a source. Consider                            evaluation of the small bowel with capsule endoscopy Recommendation:           1. Please take your iron supplement TWICE daily                            EVERY DAY.                           2. Repeat CBC and ferritin level in 6 weeks                           3. Office follow-up with Dr. Henrene Pastor in 6 weeks.                            Please have your blood work obtained a day or 2                            before your visit Rodricus Candelaria N. Henrene Pastor, MD 11/19/2018 4:10:02 PM This report has been signed electronically.

## 2018-11-19 NOTE — Progress Notes (Signed)
Vitals , and temp per CW

## 2018-11-19 NOTE — Progress Notes (Signed)
To PACU, VSS. Report to [email or web address] 

## 2018-11-20 ENCOUNTER — Other Ambulatory Visit: Payer: Self-pay

## 2018-11-20 DIAGNOSIS — D509 Iron deficiency anemia, unspecified: Secondary | ICD-10-CM

## 2018-11-21 ENCOUNTER — Telehealth: Payer: Self-pay | Admitting: *Deleted

## 2018-11-21 ENCOUNTER — Telehealth: Payer: Self-pay

## 2018-11-21 NOTE — Telephone Encounter (Signed)
1. Have you developed a fever since your procedure? no  2.   Have you had an respiratory symptoms (SOB or cough) since your procedure? no  3.   Have you tested positive for COVID 19 since your procedure no  4.   Have you had any family members/close contacts diagnosed with the COVID 19 since your procedure?  no   If yes to any of these questions please route to Joylene John, RN and Alphonsa Gin, Therapist, sports.  Follow up Call-  Call back number 11/19/2018  Post procedure Call Back phone  # [phone]  Permission to leave phone message Yes  Some recent data might be hidden     Patient questions:  Do you have a fever, pain , or abdominal swelling? No. Pain Score  0 *  Have you tolerated food without any problems? Yes.    Have you been able to return to your normal activities? Yes.    Do you have any questions about your discharge instructions: Diet   No. Medications  No. Follow up visit  No.  Do you have questions or concerns about your Care? No.  Actions: * If pain score is 4 or above: No action needed, pain <4.

## 2018-11-21 NOTE — Telephone Encounter (Signed)
Left message on follow up call. 

## 2018-11-24 ENCOUNTER — Encounter: Payer: Self-pay | Admitting: Internal Medicine

## 2018-11-25 ENCOUNTER — Encounter: Payer: Self-pay | Admitting: Internal Medicine

## 2018-11-26 MED FILL — LISINOPRIL 5 MG TAB: 5 | 30 days supply | Qty: 30 | Fill #3

## 2018-11-26 MED FILL — GABAPENTIN 300 MG CAPSULE: 300 | 30 days supply | Qty: 120 | Fill #3

## 2018-11-26 MED FILL — CARVEDILOL 3.125 MG TABLET: 3.125 | 30 days supply | Qty: 30 | Fill #2

## 2018-11-26 MED FILL — FERROUS SULFATE 325 MG TAB: 325 (65 FE) | 30 days supply | Qty: 60 | Fill #1

## 2018-11-26 MED FILL — ATORVASTATIN CALCIUM 40 MG: 40 | 30 days supply | Qty: 30 | Fill #3

## 2018-12-29 MED FILL — ATORVASTATIN CALCIUM 40 MG: 40 | 30 days supply | Qty: 30 | Fill #4

## 2018-12-29 MED FILL — LISINOPRIL 5 MG TABLET: 5 | 30 days supply | Qty: 30 | Fill #4

## 2018-12-29 MED FILL — GABAPENTIN 300 MG CAPSULE: 300 | 30 days supply | Qty: 120 | Fill #4

## 2018-12-29 MED FILL — CARVEDILOL 3.125 MG TABLET: 3.125 | 30 days supply | Qty: 30 | Fill #3

## 2018-12-29 MED FILL — FERROUS SULFATE 325 MG TAB: 325 (65 FE) | 30 days supply | Qty: 60 | Fill #2

## 2018-12-30 ENCOUNTER — Encounter

## 2018-12-30 ENCOUNTER — Ambulatory Visit (INDEPENDENT_AMBULATORY_CARE_PROVIDER_SITE_OTHER): Payer: 59 | Admitting: Cardiovascular Disease

## 2018-12-30 ENCOUNTER — Encounter: Payer: Self-pay | Admitting: Cardiovascular Disease

## 2018-12-30 ENCOUNTER — Other Ambulatory Visit: Payer: Self-pay

## 2018-12-30 VITALS — BP 129/76 | HR 81 | Ht 65.0 in | Wt 166.4 lb

## 2018-12-30 DIAGNOSIS — I739 Peripheral vascular disease, unspecified: Secondary | ICD-10-CM | POA: Diagnosis not present

## 2018-12-30 DIAGNOSIS — I25118 Atherosclerotic heart disease of native coronary artery with other forms of angina pectoris: Secondary | ICD-10-CM

## 2018-12-30 DIAGNOSIS — Z720 Tobacco use: Secondary | ICD-10-CM | POA: Diagnosis not present

## 2018-12-30 DIAGNOSIS — E785 Hyperlipidemia, unspecified: Secondary | ICD-10-CM

## 2018-12-30 LAB — BASIC METABOLIC PANEL
BUN/Creatinine Ratio: 9 (ref 9–23)
BUN: 9 mg/dL (ref 6–24)
CO2: 23 mmol/L (ref 20–29)
Calcium: 10.1 mg/dL (ref 8.7–10.2)
Chloride: 104 mmol/L (ref 96–106)
Creatinine, Ser: 0.95 mg/dL (ref 0.57–1.00)
GFR calc Af Amer: 80 mL/min/{1.73_m2} (ref >59)
GFR calc non Af Amer: 70 mL/min/{1.73_m2} (ref >59)
Glucose: 102 mg/dL — ABNORMAL HIGH (ref 65–99)
Potassium: 5 mmol/L (ref 3.5–5.2)
Sodium: 141 mmol/L (ref 134–144)

## 2018-12-30 LAB — CBC
Hematocrit: 43.7 % (ref 34.0–46.6)
Hemoglobin: 14.1 g/dL (ref 11.1–15.9)
MCH: 27.5 pg (ref 26.6–33.0)
MCHC: 32.3 g/dL (ref 31.5–35.7)
MCV: 85 fL (ref 79–97)
Platelets: 306 10*3/uL (ref 150–450)
RBC: 5.12 x10E6/uL (ref 3.77–5.28)
RDW: 19.9 % — ABNORMAL HIGH (ref 11.7–15.4)
WBC: 7.2 10*3/uL (ref 3.4–10.8)

## 2018-12-30 NOTE — H&P (View-Only) (Signed)
Cardiology Office Note   Date:  12/30/2018   ID:  [name], DOB 05-Feb-1966, MRN [id number]  PCP:  Robin Rakes, MD  Cardiologist:  Dr. Marlou Walsh  No chief complaint on file.     History of Present Illness: Robin Walsh is a 53 y.o. female who presents for a follow-up visit regarding  peripheral arterial disease. She has known history of coronary artery disease, hypertension, hyperlipidemia and tobacco use. She is status post LAD PCI in 2017. She is status post bilateral common iliac artery kissing stent placement in 12/2015 for severe claudication. She had no significant infrainguinal disease. She was seen recently for recurrent claudication due to severe in-stent restenosis.  Due to severity of her symptoms, I will schedule her for an angiogram with planned endovascular intervention.  However, her preprocedure labs showed severe anemia with a hemoglobin of 7.5.  The patient was hospitalized and transfused.  She was then seen by GI and had EGD and colonoscopy.  She had few polyps removed and she also reports having an anal fissure that was causing the bleeding.  This has improved now.  She has been taking iron and vitamins. She reports actually worsening bilateral leg claudication especially on the right side.  This is happening now with walking few steps.  She continues to smoke half a pack per day.  Past Medical History:  Diagnosis Date  . Anal fissure   . Anemia   . CAD (coronary artery disease)   . Hyperlipidemia   . Hypertension     Past Surgical History:  Procedure Laterality Date  . CARDIAC CATHETERIZATION N/A 11/28/2015   Procedure: Left Heart Cath and Coronary Angiography;  Surgeon: Robin Booze, MD;  Location: [facility];  Service: Cardiovascular;  Laterality: N/A;  . CARDIAC CATHETERIZATION N/A 11/28/2015   Procedure: Coronary Stent Intervention;  Surgeon: Robin Booze, MD;  Location: [facility];  Service: Cardiovascular;   Laterality: N/A;  DES to Mid LAD; 3.0x12 Synergy  . LEG SURGERY Left 2002?   tibial plateau fracture  . PERIPHERAL VASCULAR CATHETERIZATION N/A 01/04/2016   Procedure: Abdominal Aortogram;  Surgeon: Robin Bush, MD;  Location: [facility];  Service: Cardiovascular;  Laterality: N/A;  . PERIPHERAL VASCULAR CATHETERIZATION Bilateral 01/04/2016   Procedure: Lower Extremity Angiography;  Surgeon: Robin Bush, MD;  Location: [facility];  Service: Cardiovascular;  Laterality: Bilateral;  . PERIPHERAL VASCULAR CATHETERIZATION Bilateral 01/04/2016   Procedure: Peripheral Vascular Intervention;  Surgeon: Robin Bush, MD;  Location: [facility];  Service: Cardiovascular;  Laterality: Bilateral;  common iliac     Current Outpatient Medications  Medication Sig Dispense Refill  . acetaminophen (TYLENOL) 500 MG tablet Take 500 mg by mouth every 6 (six) hours as needed (for headaches).    Marland Kitchen albuterol (VENTOLIN HFA) 108 (90 Base) MCG/ACT inhaler INHALE 2 PUFFS INTO THE LUNGS EVERY 6 HOURS AS NEEDED FOR WHEEZING OR SHORTNESS OF BREATH. (Patient taking differently: Inhale 2 puffs into the lungs every 6 (six) hours as needed for wheezing or shortness of breath. ) 18 g 2  . aspirin 81 MG chewable tablet Chew 1 tablet (81 mg total) by mouth daily. (Patient taking differently: Chew 81 mg by mouth every evening. ) 30 tablet 11  . atorvastatin (LIPITOR) 40 MG tablet TAKE 1 TABLET BY MOUTH DAILY AT 6 PM. (Patient taking differently: Take 40 mg by mouth every evening. ) 90 tablet 1  . carvedilol (COREG) 3.125 MG  tablet Take 1 tablet (3.125 mg total) by mouth daily. (Patient taking differently: Take 3.125 mg by mouth every evening. ) 180 tablet 1  . ferrous sulfate 325 (65 FE) MG EC tablet Take 1 tablet (325 mg total) by mouth 2 (two) times daily. 60 tablet 3  . gabapentin (NEURONTIN) 300 MG capsule Take 2 capsules (600 mg total) by mouth 2 (two) times daily. 360 capsule 1  . hydrocortisone  (ANUSOL-HC) 25 MG suppository Place 1 suppository (25 mg total) rectally 2 (two) times daily. 12 suppository 0  . lisinopril (ZESTRIL) 5 MG tablet Take 5 mg by mouth daily.    . nitroGLYCERIN (NITROSTAT) 0.4 MG SL tablet Place 1 tablet (0.4 mg total) under the tongue every 5 (five) minutes as needed for chest pain. X 3 doses 25 tablet 1  . varenicline (CHANTIX CONTINUING MONTH PAK) 1 MG tablet Take 1 tablet (1 mg total) by mouth 2 (two) times daily. 60 tablet 1  . varenicline (CHANTIX STARTING MONTH PAK) 0.5 MG X 11 & 1 MG X 42 tablet Take one 0.5 mg tablet by mouth once daily for 3 days, then increase to one 0.5 mg tablet twice daily for 4 days, then increase to one 1 mg tablet twice daily. 53 tablet 0   No current facility-administered medications for this visit.     Allergies:   Chantix [varenicline tartrate], Codeine, and Dilaudid [hydromorphone hcl]    Social History:  The patient  reports that she has been smoking cigarettes. She has a 12.50 pack-year smoking history. She has never used smokeless tobacco. She reports current drug use. Drug: Marijuana. She reports that she does not drink alcohol.   Family History:  The patient's family history includes Alcohol abuse (age of onset: 43) in her father; Congestive Heart Failure (age of onset: 62) in her mother; Heart disease in her brother; Liver disease in her sister.    ROS:  Please see the history of present illness.   Otherwise, review of systems are positive for none.   All other systems are reviewed and negative.    PHYSICAL EXAM: VS:  BP 129/76   Pulse 81   Ht 5\' 5"  (1.651 m)   Wt 166 lb 6.4 oz (75.5 kg)   BMI 27.69 kg/m  , BMI Body mass index is 27.69 kg/m. GEN: Well nourished, well developed, in no acute distress  HEENT: normal  Neck: no JVD, carotid bruits, or masses Cardiac: RRR; no murmurs, rubs, or gallops,no edema  Respiratory:  clear to auscultation bilaterally, normal work of breathing GI: soft, nontender,  nondistended, + BS MS: no deformity or atrophy  Skin: warm and dry, no rash Neuro:  Strength and sensation are intact Psych: euthymic mood, full affect Vascular: Femoral pulses: Not palpable on the right side and +1 on the left side.  Distal pulses are not palpable.  EKG:  EKG is not ordered today.   Recent Labs: 10/23/2018: ALT 9 10/24/2018: BUN 10; Creatinine, Ser 0.99; Hemoglobin 8.7; Platelets 475; Potassium 4.1; Sodium 138    Lipid Panel    Component Value Date/Time   CHOL 145 09/26/2017 0957   TRIG 225 (H) 09/26/2017 0957   HDL 26 (L) 09/26/2017 0957   CHOLHDL 5.6 (H) 09/26/2017 0957   CHOLHDL 7.8 11/27/2015 0453   VLDL 66 (H) 11/27/2015 0453   LDLCALC 74 09/26/2017 0957      Wt Readings from Last 3 Encounters:  12/30/18 166 lb 6.4 oz (75.5 kg)  11/19/18 161 lb (73  kg)  11/10/18 161 lb 6 oz (73.2 kg)       No flowsheet data found.    ASSESSMENT AND PLAN:  1.  Peripheral arterial disease with severe bilateral leg claudication due to in-stent restenosis in the common iliac arteries.   Currently with Rutherford class III. I suspect significant progression especially on the right side as I am not able to palpate her right femoral pulse.  Given severity of her symptoms, I recommend rescheduling abdominal aortogram with lower extremity runoff and possible endovascular intervention.  The plan is to try to avoid adding another layer of stent.  The previously placed stents were 7 x 27 mm.  We could consider drug-coated balloon angioplasty.  She prefers the radial approach if possible and we will see if the equipment is long enough.  Her left radial pulses normal.  2. Tobacco use: Unfortunately, she has not been able to quit smoking and currently smokes half a pack per day.  I discussed with her the association of restenosis with continued tobacco use.  3. Coronary artery disease involving native coronary arteries with other forms of angina:   Continue medical therapy.  4.  Hyperlipidemia: Continue treatment with atorvastatin with a target LDL of less than 70.   Disposition:   FU with me in 1 months  Signed,  [name], MD  12/30/2018 9:00 AM    [place]

## 2018-12-30 NOTE — Patient Instructions (Addendum)
Medication Instructions:  Your physician recommends that you continue on your current medications as directed. Please refer to the Current Medication list given to you today.  If you need a refill on your cardiac medications before your next appointment, please call your pharmacy.   Lab work: Your provider would like for you to have the following labs today: CBC and BMET  If you have labs (blood work) drawn today and your tests are completely normal, you will receive your results only by: [place] (if you have MyChart) OR A paper copy in the mail If you have any lab test that is abnormal or we need to change your treatment, we will call you to review the results.  Testing/Procedures: Your physician has requested that you have a peripheral vascular angiogram. This exam is performed at the hospital. During this exam IV contrast is used to look at arterial blood flow. Please review the information sheet given for details.  Follow-Up: At [facility], you and your health needs are our priority.  As part of our continuing mission to provide you with exceptional heart care, we have created designated Provider Care Teams.  These Care Teams include your primary Cardiologist (physician) and Advanced Practice Providers (APPs -  Physician Assistants and Nurse Practitioners) who all work together to provide you with the care you need, when you need it. You will need a follow up appointment in one month after your procedure on 01/14/2019  Any Other Special Instructions Will Be Listed Below (If Applicable).    [facility] [address] [place], [place]  [address] Phone:  [phone]   Fax:  [phone]   You are scheduled for a Peripheral Angiogram on Wednesday, October 7 with Dr. [name].  1. Please arrive at the [facility] (Main Entrance A) at [facility]: [address] [place], [place] [address] at 6:30 AM (This time is two hours before your  procedure to ensure your preparation). Free valet parking service is available.   Special note: Every effort is made to have your procedure done on time. Please understand that emergencies sometimes delay scheduled procedures.  2. Diet: Do not eat solid foods after midnight.  You may have clear liquids until 5am upon the day of the procedure.  3. Labs: You will need to have the coronavirus test completed prior to your procedure. An appointment has been made at 12:20 on 01/10/2019. This is a Drive Up Visit at the [facility] [address]. Please tell them that you are there for pre procedure testing. Someone will direct you to the appropriate testing line. Stay in your car and someone will be with you shortly. Please make sure to have all other labs completed before this test because you will need to stay quarantined until your procedure.  4. Medication instructions in preparation for your procedure: Nothing to hold  On the morning of your procedure, take your Aspirin and any morning medicines NOT listed above.  You may use sips of water.  5. Plan for one night stay--bring personal belongings. 6. Bring a current list of your medications and current insurance cards. 7. You MUST have a responsible person to drive you home. 8. Someone MUST be with you the first 24 hours after you arrive home or your discharge will be delayed. 9. Please wear clothes that are easy to get on and off and wear slip-on shoes.  Thank you for allowing [place] to care for you!   --  [facility]

## 2018-12-30 NOTE — Progress Notes (Signed)
Cardiology Office Note   Date:  12/30/2018   ID:  Robin Walsh, DOB Mar 09, 1966, MRN [id number]  PCP:  Charlott Rakes, MD  Cardiologist:  Dr. Marlou Porch  No chief complaint on file.     History of Present Illness: Robin Walsh is a 53 y.o. female who presents for a follow-up visit regarding  peripheral arterial disease. She has known history of coronary artery disease, hypertension, hyperlipidemia and tobacco use. She is status post LAD PCI in 2017. She is status post bilateral common iliac artery kissing stent placement in 12/2015 for severe claudication. She had no significant infrainguinal disease. She was seen recently for recurrent claudication due to severe in-stent restenosis.  Due to severity of her symptoms, I will schedule her for an angiogram with planned endovascular intervention.  However, her preprocedure labs showed severe anemia with a hemoglobin of 7.5.  The patient was hospitalized and transfused.  She was then seen by GI and had EGD and colonoscopy.  She had few polyps removed and she also reports having an anal fissure that was causing the bleeding.  This has improved now.  She has been taking iron and vitamins. She reports actually worsening bilateral leg claudication especially on the right side.  This is happening now with walking few steps.  She continues to smoke half a pack per day.  Past Medical History:  Diagnosis Date  . Anal fissure   . Anemia   . CAD (coronary artery disease)   . Hyperlipidemia   . Hypertension     Past Surgical History:  Procedure Laterality Date  . CARDIAC CATHETERIZATION N/A 11/28/2015   Procedure: Left Heart Cath and Coronary Angiography;  Surgeon: Jettie Booze, MD;  Location: [facility];  Service: Cardiovascular;  Laterality: N/A;  . CARDIAC CATHETERIZATION N/A 11/28/2015   Procedure: Coronary Stent Intervention;  Surgeon: Jettie Booze, MD;  Location: [facility];  Service: Cardiovascular;   Laterality: N/A;  DES to Mid LAD; 3.0x12 Synergy  . LEG SURGERY Left 2002?   tibial plateau fracture  . PERIPHERAL VASCULAR CATHETERIZATION N/A 01/04/2016   Procedure: Abdominal Aortogram;  Surgeon: Nelva Bush, MD;  Location: [facility];  Service: Cardiovascular;  Laterality: N/A;  . PERIPHERAL VASCULAR CATHETERIZATION Bilateral 01/04/2016   Procedure: Lower Extremity Angiography;  Surgeon: Nelva Bush, MD;  Location: [facility];  Service: Cardiovascular;  Laterality: Bilateral;  . PERIPHERAL VASCULAR CATHETERIZATION Bilateral 01/04/2016   Procedure: Peripheral Vascular Intervention;  Surgeon: Nelva Bush, MD;  Location: [facility];  Service: Cardiovascular;  Laterality: Bilateral;  common iliac     Current Outpatient Medications  Medication Sig Dispense Refill  . acetaminophen (TYLENOL) 500 MG tablet Take 500 mg by mouth every 6 (six) hours as needed (for headaches).    Marland Kitchen albuterol (VENTOLIN HFA) 108 (90 Base) MCG/ACT inhaler INHALE 2 PUFFS INTO THE LUNGS EVERY 6 HOURS AS NEEDED FOR WHEEZING OR SHORTNESS OF BREATH. (Patient taking differently: Inhale 2 puffs into the lungs every 6 (six) hours as needed for wheezing or shortness of breath. ) 18 g 2  . aspirin 81 MG chewable tablet Chew 1 tablet (81 mg total) by mouth daily. (Patient taking differently: Chew 81 mg by mouth every evening. ) 30 tablet 11  . atorvastatin (LIPITOR) 40 MG tablet TAKE 1 TABLET BY MOUTH DAILY AT 6 PM. (Patient taking differently: Take 40 mg by mouth every evening. ) 90 tablet 1  . carvedilol (COREG) 3.125 MG  tablet Take 1 tablet (3.125 mg total) by mouth daily. (Patient taking differently: Take 3.125 mg by mouth every evening. ) 180 tablet 1  . ferrous sulfate 325 (65 FE) MG EC tablet Take 1 tablet (325 mg total) by mouth 2 (two) times daily. 60 tablet 3  . gabapentin (NEURONTIN) 300 MG capsule Take 2 capsules (600 mg total) by mouth 2 (two) times daily. 360 capsule 1  . hydrocortisone  (ANUSOL-HC) 25 MG suppository Place 1 suppository (25 mg total) rectally 2 (two) times daily. 12 suppository 0  . lisinopril (ZESTRIL) 5 MG tablet Take 5 mg by mouth daily.    . nitroGLYCERIN (NITROSTAT) 0.4 MG SL tablet Place 1 tablet (0.4 mg total) under the tongue every 5 (five) minutes as needed for chest pain. X 3 doses 25 tablet 1  . varenicline (CHANTIX CONTINUING MONTH PAK) 1 MG tablet Take 1 tablet (1 mg total) by mouth 2 (two) times daily. 60 tablet 1  . varenicline (CHANTIX STARTING MONTH PAK) 0.5 MG X 11 & 1 MG X 42 tablet Take one 0.5 mg tablet by mouth once daily for 3 days, then increase to one 0.5 mg tablet twice daily for 4 days, then increase to one 1 mg tablet twice daily. 53 tablet 0   No current facility-administered medications for this visit.     Allergies:   Chantix [varenicline tartrate], Codeine, and Dilaudid [hydromorphone hcl]    Social History:  The patient  reports that she has been smoking cigarettes. She has a 12.50 pack-year smoking history. She has never used smokeless tobacco. She reports current drug use. Drug: Marijuana. She reports that she does not drink alcohol.   Family History:  The patient's family history includes Alcohol abuse (age of onset: 52) in her father; Congestive Heart Failure (age of onset: 3) in her mother; Heart disease in her brother; Liver disease in her sister.    ROS:  Please see the history of present illness.   Otherwise, review of systems are positive for none.   All other systems are reviewed and negative.    PHYSICAL EXAM: VS:  BP 129/76   Pulse 81   Ht 5\' 5"  (1.651 m)   Wt 166 lb 6.4 oz (75.5 kg)   BMI 27.69 kg/m  , BMI Body mass index is 27.69 kg/m. GEN: Well nourished, well developed, in no acute distress  HEENT: normal  Neck: no JVD, carotid bruits, or masses Cardiac: RRR; no murmurs, rubs, or gallops,no edema  Respiratory:  clear to auscultation bilaterally, normal work of breathing GI: soft, nontender,  nondistended, + BS MS: no deformity or atrophy  Skin: warm and dry, no rash Neuro:  Strength and sensation are intact Psych: euthymic mood, full affect Vascular: Femoral pulses: Not palpable on the right side and +1 on the left side.  Distal pulses are not palpable.  EKG:  EKG is not ordered today.   Recent Labs: 10/23/2018: ALT 9 10/24/2018: BUN 10; Creatinine, Ser 0.99; Hemoglobin 8.7; Platelets 475; Potassium 4.1; Sodium 138    Lipid Panel    Component Value Date/Time   CHOL 145 09/26/2017 0957   TRIG 225 (H) 09/26/2017 0957   HDL 26 (L) 09/26/2017 0957   CHOLHDL 5.6 (H) 09/26/2017 0957   CHOLHDL 7.8 11/27/2015 0453   VLDL 66 (H) 11/27/2015 0453   LDLCALC 74 09/26/2017 0957      Wt Readings from Last 3 Encounters:  12/30/18 166 lb 6.4 oz (75.5 kg)  11/19/18 161 lb (73  kg)  11/10/18 161 lb 6 oz (73.2 kg)       No flowsheet data found.    ASSESSMENT AND PLAN:  1.  Peripheral arterial disease with severe bilateral leg claudication due to in-stent restenosis in the common iliac arteries.   Currently with Rutherford class III. I suspect significant progression especially on the right side as I am not able to palpate her right femoral pulse.  Given severity of her symptoms, I recommend rescheduling abdominal aortogram with lower extremity runoff and possible endovascular intervention.  The plan is to try to avoid adding another layer of stent.  The previously placed stents were 7 x 27 mm.  We could consider drug-coated balloon angioplasty.  She prefers the radial approach if possible and we will see if the equipment is long enough.  Her left radial pulses normal.  2. Tobacco use: Unfortunately, she has not been able to quit smoking and currently smokes half a pack per day.  I discussed with her the association of restenosis with continued tobacco use.  3. Coronary artery disease involving native coronary arteries with other forms of angina:   Continue medical therapy.  4.  Hyperlipidemia: Continue treatment with atorvastatin with a target LDL of less than 70.   Disposition:   FU with me in 1 months  Signed,  [name], MD  12/30/2018 9:00 AM    [place]

## 2019-01-01 ENCOUNTER — Ambulatory Visit: Payer: 59 | Admitting: Internal Medicine

## 2019-01-10 ENCOUNTER — Other Ambulatory Visit (HOSPITAL_COMMUNITY)
Admission: RE | Admit: 2019-01-10 | Discharge: 2019-01-10 | Disposition: A | Discharge status: Home / Self Care | Payer: 59 | Source: Ambulatory Visit | Attending: Cardiovascular Disease | Admitting: Cardiovascular Disease

## 2019-01-10 DIAGNOSIS — Z01812 Encounter for preprocedural laboratory examination: Secondary | ICD-10-CM | POA: Diagnosis present

## 2019-01-10 DIAGNOSIS — Z20828 Contact with and (suspected) exposure to other viral communicable diseases: Secondary | ICD-10-CM | POA: Insufficient documentation

## 2019-01-11 LAB — NOVEL CORONAVIRUS, NAA (HOSP ORDER, SEND-OUT TO REF LAB; TAT 18-24 HRS): SARS-CoV-2, NAA: NOT DETECTED

## 2019-01-13 ENCOUNTER — Telehealth: Payer: Self-pay | Admitting: *Deleted

## 2019-01-13 NOTE — Telephone Encounter (Signed)
Pt contacted pre-catheterization scheduled at [facility] for: Wednesday January 14, 2019 8:30 AM Verified arrival time and place: [facility]) at: 6:30 AM   No solid food after midnight prior to cath, clear liquids until 5 AM day of procedure. Contrast allergy: no  AM meds can be  taken pre-cath with sip of water including: ASA 81 mg   Confirmed patient has responsible adult to drive home post procedure and observe 24 hours after arriving home: yes  Currently, due to Covid-19 pandemic, only one support person will be allowed with patient. Must be the same support person for that patient's entire stay, will be screened and required to wear a mask. They will be asked to wait in the waiting room for the duration of the patient's stay.  Patients are required to wear a mask when they enter the hospital.      COVID-19 Pre-Screening Questions:  . In the past 7 to 10 days have you had a cough,  shortness of breath, headache, congestion, fever (100 or greater) body aches, chills, sore throat, or sudden loss of taste or sense of smell? no . Have you been around anyone with known Covid 19? no . Have you been around anyone who is awaiting Covid 19 test results in the past 7 to 10 days? no . Have you been around anyone who has been exposed to Covid 19, or has mentioned symptoms of Covid 19 within the past 7 to 10 days? no  I reviewed procedure/mask/visitor instructions, Covid-19 screening questions with patient, she verbalized understanding, thanked me for call.

## 2019-01-14 ENCOUNTER — Other Ambulatory Visit: Payer: Self-pay | Admitting: *Deleted

## 2019-01-14 ENCOUNTER — Encounter (HOSPITAL_COMMUNITY): Payer: Self-pay | Admitting: Cardiovascular Disease

## 2019-01-14 ENCOUNTER — Encounter (HOSPITAL_COMMUNITY)
Admission: RE | Disposition: A | Discharge status: Home / Self Care | Payer: Self-pay | Source: Home / Self Care | Attending: Cardiovascular Disease

## 2019-01-14 ENCOUNTER — Other Ambulatory Visit: Payer: Self-pay

## 2019-01-14 ENCOUNTER — Ambulatory Visit (HOSPITAL_COMMUNITY)
Admission: RE | Admit: 2019-01-14 | Discharge: 2019-01-14 | Disposition: A | Discharge status: Home / Self Care | Payer: 59 | Attending: Cardiovascular Disease | Admitting: Cardiovascular Disease

## 2019-01-14 DIAGNOSIS — Z8249 Family history of ischemic heart disease and other diseases of the circulatory system: Secondary | ICD-10-CM | POA: Diagnosis not present

## 2019-01-14 DIAGNOSIS — Z79899 Other long term (current) drug therapy: Secondary | ICD-10-CM | POA: Insufficient documentation

## 2019-01-14 DIAGNOSIS — Z7982 Long term (current) use of aspirin: Secondary | ICD-10-CM | POA: Insufficient documentation

## 2019-01-14 DIAGNOSIS — Z955 Presence of coronary angioplasty implant and graft: Secondary | ICD-10-CM | POA: Insufficient documentation

## 2019-01-14 DIAGNOSIS — F17210 Nicotine dependence, cigarettes, uncomplicated: Secondary | ICD-10-CM | POA: Insufficient documentation

## 2019-01-14 DIAGNOSIS — I739 Peripheral vascular disease, unspecified: Secondary | ICD-10-CM

## 2019-01-14 DIAGNOSIS — Z885 Allergy status to narcotic agent status: Secondary | ICD-10-CM | POA: Diagnosis not present

## 2019-01-14 DIAGNOSIS — I10 Essential (primary) hypertension: Secondary | ICD-10-CM | POA: Diagnosis not present

## 2019-01-14 DIAGNOSIS — I25119 Atherosclerotic heart disease of native coronary artery with unspecified angina pectoris: Secondary | ICD-10-CM | POA: Insufficient documentation

## 2019-01-14 DIAGNOSIS — E785 Hyperlipidemia, unspecified: Secondary | ICD-10-CM | POA: Diagnosis not present

## 2019-01-14 DIAGNOSIS — D649 Anemia, unspecified: Secondary | ICD-10-CM | POA: Diagnosis not present

## 2019-01-14 DIAGNOSIS — I70213 Atherosclerosis of native arteries of extremities with intermittent claudication, bilateral legs: Secondary | ICD-10-CM

## 2019-01-14 HISTORY — PX: ABDOMINAL AORTOGRAM W/LOWER EXTREMITY: CATH118223

## 2019-01-14 HISTORY — PX: PERIPHERAL VASCULAR BALLOON ANGIOPLASTY: CATH118281

## 2019-01-14 LAB — POCT I-STAT 7, (LYTES, BLD GAS, ICA,H+H)
Bicarbonate: 26.8 mmol/L (ref 20.0–28.0)
Calcium, Ion: 1.24 mmol/L (ref 1.15–1.40)
HCT: 43 % (ref 36.0–46.0)
Hemoglobin: 14.6 g/dL (ref 12.0–15.0)
O2 Saturation: 98 %
Potassium: 4.6 mmol/L (ref 3.5–5.1)
Sodium: 140 mmol/L (ref 135–145)
TCO2: 28 mmol/L (ref 22–32)
pCO2 arterial: 51.8 mmHg — ABNORMAL HIGH (ref 32.0–48.0)
pH, Arterial: 7.322 — ABNORMAL LOW (ref 7.350–7.450)
pO2, Arterial: 107 mmHg (ref 83.0–108.0)

## 2019-01-14 LAB — PREGNANCY, URINE: Preg Test, Ur: NEGATIVE

## 2019-01-14 SURGERY — ABDOMINAL AORTOGRAM W/LOWER EXTREMITY
Anesthesia: LOCAL

## 2019-01-14 MED ORDER — HYDRALAZINE HCL 20 MG/ML IJ SOLN: 5.0000 mg | INTRAMUSCULAR | Status: DC | PRN

## 2019-01-14 MED ORDER — FENTANYL CITRATE (PF) 100 MCG/2ML IJ SOLN
INTRAMUSCULAR | Status: AC
  Filled 2019-01-14: qty 2

## 2019-01-14 MED ORDER — LABETALOL HCL 5 MG/ML IV SOLN: 10.0000 mg | INTRAVENOUS | Status: DC | PRN

## 2019-01-14 MED ORDER — HEPARIN SODIUM (PORCINE) 1000 UNIT/ML IJ SOLN
INTRAMUSCULAR | Status: AC
  Filled 2019-01-14: qty 1

## 2019-01-14 MED ORDER — CLOPIDOGREL BISULFATE 300 MG PO TABS
ORAL_TABLET | ORAL | Status: DC | PRN
  Administered 2019-01-14: 300 mg via ORAL

## 2019-01-14 MED ORDER — HEPARIN SODIUM (PORCINE) 1000 UNIT/ML IJ SOLN
INTRAMUSCULAR | Status: DC | PRN
  Administered 2019-01-14: 5000 [IU] via INTRAVENOUS

## 2019-01-14 MED ORDER — FENTANYL CITRATE (PF) 100 MCG/2ML IJ SOLN
INTRAMUSCULAR | Status: DC | PRN
  Administered 2019-01-14: 25 ug via INTRAVENOUS
  Administered 2019-01-14 (×3): 50 ug via INTRAVENOUS

## 2019-01-14 MED ORDER — ACETAMINOPHEN 325 MG PO TABS: 650.0000 mg | ORAL_TABLET | ORAL | Status: DC | PRN

## 2019-01-14 MED ORDER — SODIUM CHLORIDE 0.9 % WEIGHT BASED INFUSION
3.0000 mL/kg/h | INTRAVENOUS | Status: AC
  Administered 2019-01-14: 07:00:00 3 mL/kg/h via INTRAVENOUS

## 2019-01-14 MED ORDER — SODIUM CHLORIDE 0.9% FLUSH: 3.0000 mL | INTRAVENOUS | Status: DC | PRN

## 2019-01-14 MED ORDER — HEPARIN (PORCINE) IN NACL 1000-0.9 UT/500ML-% IV SOLN
INTRAVENOUS | Status: AC
  Filled 2019-01-14: qty 500

## 2019-01-14 MED ORDER — SODIUM CHLORIDE 0.9 % WEIGHT BASED INFUSION: 1.0000 mL/kg/h | INTRAVENOUS | Status: DC

## 2019-01-14 MED ORDER — CLOPIDOGREL BISULFATE 75 MG PO TABS: 75.0000 mg | ORAL_TABLET | Freq: Every day | ORAL | 2 refills | Status: DC

## 2019-01-14 MED ORDER — HEPARIN (PORCINE) IN NACL 1000-0.9 UT/500ML-% IV SOLN
INTRAVENOUS | Status: DC | PRN
  Administered 2019-01-14 (×2): 500 mL

## 2019-01-14 MED ORDER — MIDAZOLAM HCL 2 MG/2ML IJ SOLN
INTRAMUSCULAR | Status: DC | PRN
  Administered 2019-01-14 (×3): 1 mg via INTRAVENOUS

## 2019-01-14 MED ORDER — MIDAZOLAM HCL 2 MG/2ML IJ SOLN
INTRAMUSCULAR | Status: AC
  Filled 2019-01-14: qty 2

## 2019-01-14 MED ORDER — ASPIRIN 81 MG PO CHEW
81.0000 mg | CHEWABLE_TABLET | ORAL | Status: AC
  Administered 2019-01-14: 07:00:00 81 mg via ORAL
  Filled 2019-01-14: qty 1

## 2019-01-14 MED ORDER — IODIXANOL 320 MG/ML IV SOLN
INTRAVENOUS | Status: DC | PRN
  Administered 2019-01-14: 10:00:00 105 mL via INTRA_ARTERIAL

## 2019-01-14 MED ORDER — SODIUM CHLORIDE 0.9 % IV SOLN: 250.0000 mL | INTRAVENOUS | Status: DC | PRN

## 2019-01-14 MED ORDER — LIDOCAINE HCL (PF) 1 % IJ SOLN
INTRAMUSCULAR | Status: DC | PRN
  Administered 2019-01-14: 30 mL via INTRADERMAL
  Administered 2019-01-14: 15 mL via INTRADERMAL

## 2019-01-14 MED ORDER — ONDANSETRON HCL 4 MG/2ML IJ SOLN: 4.0000 mg | Freq: Four times a day (QID) | INTRAMUSCULAR | Status: DC | PRN

## 2019-01-14 MED ORDER — SODIUM CHLORIDE 0.9% FLUSH: 3.0000 mL | Freq: Two times a day (BID) | INTRAVENOUS | Status: DC

## 2019-01-14 MED ORDER — SODIUM CHLORIDE 0.9 % IV SOLN: INTRAVENOUS | Status: DC

## 2019-01-14 MED ORDER — CLOPIDOGREL BISULFATE 300 MG PO TABS
ORAL_TABLET | ORAL | Status: AC
  Filled 2019-01-14: qty 1

## 2019-01-14 MED FILL — CLOPIDOGREL 75 MG TABLET: 75 | 30 days supply | Qty: 30 | Fill #0

## 2019-01-14 SURGICAL SUPPLY — 19 items
BALLN LUTONIX AV 7X40X75 (BALLOONS) ×6
BALLOON LUTONIX AV 7X40X75 (BALLOONS) ×4 IMPLANT
CATH ANGIO 5F PIGTAIL 65CM (CATHETERS) ×3 IMPLANT
CLOSURE MYNX CONTROL 6F/7F (Vascular Products) ×6 IMPLANT
KIT ENCORE 26 ADVANTAGE (KITS) ×6 IMPLANT
KIT MICROPUNCTURE NIT STIFF (SHEATH) ×3 IMPLANT
KIT PV (KITS) ×3 IMPLANT
SHEATH BRITE TIP 6FR 35CM (SHEATH) ×6 IMPLANT
SHEATH PINNACLE 5F 10CM (SHEATH) ×3 IMPLANT
SHEATH PINNACLE 6F 10CM (SHEATH) ×3 IMPLANT
SHEATH PROBE COVER 6X72 (BAG) ×3 IMPLANT
STOPCOCK MORSE 400PSI 3WAY (MISCELLANEOUS) ×3 IMPLANT
SYR MEDRAD MARK 7 150ML (SYRINGE) ×3 IMPLANT
TRANSDUCER W/STOPCOCK (MISCELLANEOUS) ×3 IMPLANT
TRAY PV CATH (CUSTOM PROCEDURE TRAY) ×3 IMPLANT
TUBING CIL FLEX 10 FLL-RA (TUBING) ×3 IMPLANT
WIRE BENTSON .035X145CM (WIRE) ×3 IMPLANT
WIRE HI TORQ VERSACORE J 260CM (WIRE) ×3 IMPLANT
WIRE ROSEN-J .035X180CM (WIRE) ×3 IMPLANT

## 2019-01-14 NOTE — Discharge Instructions (Signed)
Monitored Anesthesia Care, Care After These instructions provide you with information about caring for yourself after your procedure. Your health care provider may also give you more specific instructions. Your treatment has been planned according to current medical practices, but problems sometimes occur. Call your health care provider if you have any problems or questions after your procedure. What can I expect after the procedure? After your procedure, you may:  Feel sleepy for several hours.  Feel clumsy and have poor balance for several hours.  Feel forgetful about what happened after the procedure.  Have poor judgment for several hours.  Feel nauseous or vomit.  Have a sore throat if you had a breathing tube during the procedure. Follow these instructions at home: For at least 24 hours after the procedure:      Have a responsible adult stay with you. It is important to have someone help care for you until you are awake and alert.  Rest as needed.  Do not: ? Participate in activities in which you could fall or become injured. ? Drive. ? Use heavy machinery. ? Drink alcohol. ? Take sleeping pills or medicines that cause drowsiness. ? Make important decisions or sign legal documents. ? Take care of children on your own. Eating and drinking  Follow the diet that is recommended by your health care provider.  If you vomit, drink water, juice, or soup when you can drink without vomiting.  Make sure you have little or no nausea before eating solid foods. General instructions  Take over-the-counter and prescription medicines only as told by your health care provider.  If you have sleep apnea, surgery and certain medicines can increase your risk for breathing problems. Follow instructions from your health care provider about wearing your sleep device: ? Anytime you are sleeping, including during daytime naps. ? While taking prescription pain medicines, sleeping  medicines, or medicines that make you drowsy.  If you smoke, do not smoke without supervision.  Keep all follow-up visits as told by your health care provider. This is important. Contact a health care provider if:  You keep feeling nauseous or you keep vomiting.  You feel light-headed.  You develop a rash.  You have a fever. Get help right away if:  You have trouble breathing. Summary  For several hours after your procedure, you may feel sleepy and have poor judgment.  Have a responsible adult stay with you for at least 24 hours or until you are awake and alert. This information is not intended to replace advice given to you by your health care provider. Make sure you discuss any questions you have with your health care provider. Document Released: 07/17/2015 Document Revised: 06/24/2017 Document Reviewed: 07/17/2015 Elsevier Patient Education  2020 [place] After This sheet gives you information about how to care for yourself after your procedure. Your doctor may also give you more specific instructions. If you have problems or questions, contact your doctor. Follow these instructions at home: Insertion site care  Follow instructions from your doctor about how to take care of your long, thin tube (catheter) insertion area. Make sure you: ? Wash your hands with soap and water before you change your bandage (dressing). If you cannot use soap and water, use hand sanitizer. ? Change your bandage as told by your doctor. ? Leave stitches (sutures), skin glue, or skin tape (adhesive) strips in place. They may need to stay in place for 2 weeks or longer. If tape strips get  loose and curl up, you may trim the loose edges. Do not remove tape strips completely unless your doctor says it is okay.  Do not take baths, swim, or use a hot tub until your doctor says it is okay.  You may shower 24-48 hours after the procedure or as told by your doctor. ? Gently wash the  area with plain soap and water. ? Pat the area dry with a clean towel. ? Do not rub the area. This may cause bleeding.  Do not apply powder or lotion to the area. Keep the area clean and dry.  Check your insertion area every day for signs of infection. Check for: ? More redness, swelling, or pain. ? Fluid or blood. ? Warmth. ? Pus or a bad smell. Activity  Rest as told by your doctor, usually for 1-2 days.  Do not lift anything that is heavier than 10 lbs. (4.5 kg) or as told by your doctor.  Do not drive for 24 hours if you were given a medicine to help you relax (sedative).  Do not drive or use heavy machinery while taking prescription pain medicine. General instructions   Go back to your normal activities as told by your doctor, usually in about a week. Ask your doctor what activities are safe for you.  If the insertion area starts to bleed, lie flat and put pressure on the area. If the bleeding does not stop, get help right away. This is an emergency.  Drink enough fluid to keep your pee (urine) clear or pale yellow.  Take over-the-counter and prescription medicines only as told by your doctor.  Keep all follow-up visits as told by your doctor. This is important. Contact a doctor if:  You have a fever.  You have chills.  You have more redness, swelling, or pain around your insertion area.  You have fluid or blood coming from your insertion area.  The insertion area feels warm to the touch.  You have pus or a bad smell coming from your insertion area.  You have more bruising around the insertion area.  Blood collects in the tissue around the insertion area (hematoma) that may be painful to the touch. Get help right away if:  You have a lot of pain in the insertion area.  The insertion area swells very fast.  The insertion area is bleeding, and the bleeding does not stop after holding steady pressure on the area.  The area near or just beyond the insertion  area becomes pale, cool, tingly, or numb. These symptoms may be an emergency. Do not wait to see if the symptoms will go away. Get medical help right away. Call your local emergency services (911 in the [place] Do not drive yourself to the hospital. Summary  After the procedure, it is common to have bruising and tenderness at the long, thin tube insertion area.  After the procedure, it is important to rest and drink plenty of fluids.  Do not take baths, swim, or use a hot tub until your doctor says it is okay to do so. You may shower 24-48 hours after the procedure or as told by your doctor.  If the insertion area starts to bleed, lie flat and put pressure on the area. If the bleeding does not stop, get help right away. This is an emergency. This information is not intended to replace advice given to you by your health care provider. Make sure you discuss any questions you have with your health  care provider. Document Released: 06/22/2008 Document Revised: 03/08/2017 Document Reviewed: 03/20/2016 Elsevier Patient Education  2020 [employer].

## 2019-01-14 NOTE — Interval H&P Note (Signed)
History and Physical Interval Note:  01/14/2019 8:34 AM  Robin Walsh  has presented today for surgery, with the diagnosis of PAD.  The various methods of treatment have been discussed with the patient and family. After consideration of risks, benefits and other options for treatment, the patient has consented to  Procedure(s): ABDOMINAL AORTOGRAM W/LOWER EXTREMITY (N/A) as a surgical intervention.  The patient's history has been reviewed, patient examined, no change in status, stable for surgery.  I have reviewed the patient's chart and labs.  Questions were answered to the patient's satisfaction.     [name]

## 2019-01-15 ENCOUNTER — Telehealth: Payer: Self-pay | Admitting: *Deleted

## 2019-01-15 NOTE — Telephone Encounter (Signed)
Left message for patient to call and schedule follow up dopplers and appointment with Dr. Fletcher Anon

## 2019-01-19 NOTE — Telephone Encounter (Signed)
Left message for patient to call and schedule dopplers and follow up appointment with Dr. Fletcher Anon.

## 2019-01-28 MED FILL — GABAPENTIN 300 MG CAPSULE: 300 | 30 days supply | Qty: 120 | Fill #5

## 2019-01-28 MED FILL — LISINOPRIL 5 MG TABLET: 5 | 30 days supply | Qty: 30 | Fill #5

## 2019-01-28 MED FILL — ?CARVEDILOL 3.125 MG TABLET: 3.125 | 30 days supply | Qty: 30 | Fill #4

## 2019-01-28 MED FILL — ?ATORVASTATIN 40MG TABLET: 40 | 30 days supply | Qty: 30 | Fill #5

## 2019-02-02 ENCOUNTER — Ambulatory Visit (HOSPITAL_BASED_OUTPATIENT_CLINIC_OR_DEPARTMENT_OTHER)
Admission: RE | Admit: 2019-02-02 | Discharge: 2019-02-02 | Disposition: A | Discharge status: Home / Self Care | Payer: 59 | Source: Ambulatory Visit | Attending: Cardiovascular Disease | Admitting: Cardiovascular Disease

## 2019-02-02 ENCOUNTER — Other Ambulatory Visit (HOSPITAL_COMMUNITY): Payer: Self-pay | Admitting: Cardiovascular Disease

## 2019-02-02 ENCOUNTER — Other Ambulatory Visit: Payer: Self-pay

## 2019-02-02 ENCOUNTER — Ambulatory Visit (HOSPITAL_COMMUNITY)
Admission: RE | Admit: 2019-02-02 | Discharge: 2019-02-02 | Disposition: A | Discharge status: Home / Self Care | Payer: 59 | Source: Ambulatory Visit | Attending: Cardiology | Admitting: Cardiology

## 2019-02-02 DIAGNOSIS — I739 Peripheral vascular disease, unspecified: Secondary | ICD-10-CM

## 2019-02-02 DIAGNOSIS — Z95828 Presence of other vascular implants and grafts: Secondary | ICD-10-CM

## 2019-02-10 ENCOUNTER — Ambulatory Visit: Payer: 59 | Admitting: Cardiovascular Disease

## 2019-02-26 ENCOUNTER — Other Ambulatory Visit: Payer: Self-pay | Admitting: Family Medicine

## 2019-02-26 DIAGNOSIS — I2510 Atherosclerotic heart disease of native coronary artery without angina pectoris: Secondary | ICD-10-CM

## 2019-02-26 DIAGNOSIS — N951 Menopausal and female climacteric states: Secondary | ICD-10-CM

## 2019-02-26 DIAGNOSIS — Z9861 Coronary angioplasty status: Secondary | ICD-10-CM

## 2019-02-26 MED FILL — ATORVASTATIN CALCIUM 40 MG: 40 | 30 days supply | Qty: 30 | Fill #0

## 2019-02-26 MED FILL — ?CARVEDILOL 3.125 MG TABLET: 3.125 | 30 days supply | Qty: 30 | Fill #0

## 2019-02-26 MED FILL — LISINOPRIL 5 MG TABLET: 5 | 30 days supply | Qty: 30 | Fill #0

## 2019-03-03 MED FILL — GABAPENTIN 300 MG CAPSULE: 300 | 30 days supply | Qty: 120 | Fill #0

## 2019-03-26 ENCOUNTER — Telehealth: Payer: Self-pay

## 2019-03-26 ENCOUNTER — Encounter: Payer: Self-pay | Admitting: Family Medicine

## 2019-03-26 DIAGNOSIS — Z9861 Coronary angioplasty status: Secondary | ICD-10-CM

## 2019-03-26 DIAGNOSIS — I2510 Atherosclerotic heart disease of native coronary artery without angina pectoris: Secondary | ICD-10-CM

## 2019-03-26 DIAGNOSIS — I10 Essential (primary) hypertension: Secondary | ICD-10-CM

## 2019-03-26 DIAGNOSIS — J40 Bronchitis, not specified as acute or chronic: Secondary | ICD-10-CM

## 2019-03-26 DIAGNOSIS — N951 Menopausal and female climacteric states: Secondary | ICD-10-CM

## 2019-03-26 NOTE — Telephone Encounter (Signed)
Patient is requesting medication refills, patient has not been seen since May.

## 2019-03-27 MED ORDER — CARVEDILOL 3.125 MG PO TABS: 3.1250 mg | ORAL_TABLET | Freq: Two times a day (BID) | ORAL | 0 refills | Status: DC

## 2019-03-27 MED ORDER — GABAPENTIN 300 MG PO CAPS: ORAL_CAPSULE | ORAL | 1 refills | Status: DC

## 2019-03-27 MED ORDER — LISINOPRIL 5 MG PO TABS: 5.0000 mg | ORAL_TABLET | Freq: Every day | ORAL | 0 refills | Status: DC

## 2019-03-27 MED ORDER — ALBUTEROL SULFATE HFA 108 (90 BASE) MCG/ACT IN AERS: 2.0000 | INHALATION_SPRAY | Freq: Four times a day (QID) | RESPIRATORY_TRACT | 1 refills | Status: DC | PRN

## 2019-03-27 MED ORDER — ATORVASTATIN CALCIUM 40 MG PO TABS: ORAL_TABLET | ORAL | 0 refills | Status: DC

## 2019-03-27 MED FILL — ALBUTEROL SULFATE HFA 108 (: 108 (90 BAS | 25 days supply | Qty: 18 | Fill #0

## 2019-03-27 MED FILL — LISINOPRIL 5 MG TABLET: 5 | 30 days supply | Qty: 30 | Fill #0

## 2019-03-27 MED FILL — ATORVASTATIN CALCIUM 40 MG: 40 | 30 days supply | Qty: 30 | Fill #0

## 2019-03-27 MED FILL — CARVEDILOL 3.125 MG TABLET: 3.125 | 30 days supply | Qty: 60 | Fill #0

## 2019-03-27 NOTE — Telephone Encounter (Signed)
Done. Pls schedule for an office visit

## 2019-03-27 NOTE — Telephone Encounter (Signed)
Patient was sent a message via mychart.

## 2019-04-01 MED FILL — GABAPENTIN 300 MG CAPSULE: 300 | 30 days supply | Qty: 120 | Fill #1

## 2019-05-07 ENCOUNTER — Telehealth: Payer: Self-pay | Admitting: *Deleted

## 2019-05-07 NOTE — Telephone Encounter (Signed)
A message was left, re: her follow up visit. 

## 2019-05-14 ENCOUNTER — Ambulatory Visit: Payer: 59 | Attending: Family Medicine | Admitting: Family Medicine

## 2019-05-14 ENCOUNTER — Other Ambulatory Visit: Payer: Self-pay

## 2019-05-14 DIAGNOSIS — N951 Menopausal and female climacteric states: Secondary | ICD-10-CM | POA: Diagnosis not present

## 2019-05-14 DIAGNOSIS — M5431 Sciatica, right side: Secondary | ICD-10-CM

## 2019-05-14 DIAGNOSIS — I10 Essential (primary) hypertension: Secondary | ICD-10-CM | POA: Diagnosis not present

## 2019-05-14 DIAGNOSIS — Z1231 Encounter for screening mammogram for malignant neoplasm of breast: Secondary | ICD-10-CM

## 2019-05-14 DIAGNOSIS — Z9861 Coronary angioplasty status: Secondary | ICD-10-CM

## 2019-05-14 DIAGNOSIS — I2510 Atherosclerotic heart disease of native coronary artery without angina pectoris: Secondary | ICD-10-CM

## 2019-05-14 MED ORDER — NITROGLYCERIN 0.4 MG SL SUBL: 0.4000 mg | SUBLINGUAL_TABLET | SUBLINGUAL | 1 refills | Status: DC | PRN

## 2019-05-14 MED ORDER — CARVEDILOL 3.125 MG PO TABS: 3.1250 mg | ORAL_TABLET | Freq: Two times a day (BID) | ORAL | 6 refills | Status: DC

## 2019-05-14 MED ORDER — MELOXICAM 7.5 MG PO TABS: 7.5000 mg | ORAL_TABLET | Freq: Every day | ORAL | 1 refills | Status: DC

## 2019-05-14 MED ORDER — LISINOPRIL 5 MG PO TABS: 5.0000 mg | ORAL_TABLET | Freq: Every day | ORAL | 6 refills | Status: DC

## 2019-05-14 MED ORDER — GABAPENTIN 300 MG PO CAPS: ORAL_CAPSULE | ORAL | 6 refills | Status: DC

## 2019-05-14 MED ORDER — TIZANIDINE HCL 4 MG PO TABS: 4.0000 mg | ORAL_TABLET | Freq: Three times a day (TID) | ORAL | 1 refills | Status: DC | PRN

## 2019-05-14 MED ORDER — ATORVASTATIN CALCIUM 40 MG PO TABS: ORAL_TABLET | ORAL | 6 refills | Status: DC

## 2019-05-14 MED FILL — NITROGLYCERIN 0.4 MG TAB SL: 0.4 | 10 days supply | Qty: 25 | Fill #0

## 2019-05-14 MED FILL — GABAPENTIN 300 MG CAPSULE: 300 | 30 days supply | Qty: 120 | Fill #0

## 2019-05-14 MED FILL — MELOXICAM 7.5 MG TABLET: 7.5 | 30 days supply | Qty: 30 | Fill #0

## 2019-05-14 MED FILL — LISINOPRIL 5 MG TABLET: 5 | 30 days supply | Qty: 30 | Fill #0

## 2019-05-14 MED FILL — CARVEDILOL 3.125 MG TABLET: 3.125 | 30 days supply | Qty: 60 | Fill #0

## 2019-05-14 MED FILL — ATORVASTATIN CALCIUM 40 MG: 40 | 30 days supply | Qty: 30 | Fill #0

## 2019-05-14 MED FILL — tiZANidine HCL 4 MG TABS: 4 | 20 days supply | Qty: 60 | Fill #0

## 2019-05-14 NOTE — Progress Notes (Signed)
Virtual Visit via Telephone Note  I connected with [place], on 05/14/2019 at 10:46 AM by telephone due to the COVID-19 pandemic and verified that I am speaking with the correct person using two identifiers.   Consent: I discussed the limitations, risks, security and privacy concerns of performing an evaluation and management service by telephone and the availability of in person appointments. I also discussed with the patient that there may be a patient responsible charge related to this service. The patient expressed understanding and agreed to proceed.   Location of Patient: Home  Location of Provider: Clinic   Persons participating in Telemedicine visit: Leahann A Cowger Alicia Farrington-CMA Dr. Margarita Rana     History of Present Illness: Robin Walsh is a 54 year old female with a history of tobacco abuse, coronary artery disease (status post PCI and drug-eluting stent placement to LAD in 11/2015) peripheral arterial disease (status post stenting of bilateral common iliac artery) who presents today for follow-up visit.  She complains of Sciatica and her butt cheek is sore when she stretches. Symptoms started over the last couple of days. Remaining still alleviates pain but this returns when she starts moving.  She read about inflammation of the piriformis muscle and is thinking she has this. Rated as 5-6/10 and is sporadic She is not a fan of muscle relaxants she states that they make her sleepy.  With regards to her peripheral vascular disease, Dr Fletcher Anon advised her to come off Plavix and she denies presence of claudication pain at this time.  Continues to smoke. Denies presence of chest pain, dyspnea, pedal edema. She has been off her medications for 1 week and is needing refills. Past Medical History:  Diagnosis Date  . Anal fissure   . Anemia   . CAD (coronary artery disease)   . Hyperlipidemia   . Hypertension    Allergies  Allergen Reactions  . Chantix  [Varenicline Tartrate] Nausea Only  . Codeine Nausea And Vomiting  . Dilaudid [Hydromorphone Hcl] Nausea And Vomiting    Current Outpatient Medications on File Prior to Visit  Medication Sig Dispense Refill  . acetaminophen (TYLENOL) 500 MG tablet Take 500 mg by mouth every 6 (six) hours as needed for headache (for headaches).     Marland Kitchen albuterol (VENTOLIN HFA) 108 (90 Base) MCG/ACT inhaler Inhale 2 puffs into the lungs every 6 (six) hours as needed for wheezing or shortness of breath. 18 g 1  . aspirin 81 MG chewable tablet Chew 1 tablet (81 mg total) by mouth daily. (Patient taking differently: Chew 81 mg by mouth every evening. ) 30 tablet 11  . atorvastatin (LIPITOR) 40 MG tablet TAKE 1 TABLET BY MOUTH DAILY AT 6 PM. Please schedule an appointment for more refills. 30 tablet 0  . carvedilol (COREG) 3.125 MG tablet Take 1 tablet (3.125 mg total) by mouth 2 (two) times daily with a meal. 60 tablet 0  . gabapentin (NEURONTIN) 300 MG capsule TAKE 2 CAPSULES BY MOUTH 2 TIMES DAILY. 120 capsule 1  . hydrocortisone (ANUSOL-HC) 25 MG suppository Place 1 suppository (25 mg total) rectally 2 (two) times daily. (Patient taking differently: Place 25 mg rectally 2 (two) times daily as needed for hemorrhoids. ) 12 suppository 0  . lisinopril (ZESTRIL) 5 MG tablet Take 1 tablet (5 mg total) by mouth daily. Please schedule an appointment for more refills. 30 tablet 0  . nitroGLYCERIN (NITROSTAT) 0.4 MG SL tablet Place 1 tablet (0.4 mg total) under the tongue every 5 (five)  minutes as needed for chest pain. X 3 doses 25 tablet 1  . clopidogrel (PLAVIX) 75 MG tablet Take 1 tablet (75 mg total) by mouth daily. (Patient not taking: Reported on 05/14/2019) 30 tablet 2  . ferrous sulfate 325 (65 FE) MG EC tablet Take 1 tablet (325 mg total) by mouth 2 (two) times daily. 60 tablet 3  . vitamin B-12 (CYANOCOBALAMIN) 500 MCG tablet Take 500 mcg by mouth daily.     No current facility-administered medications on file prior  to visit.    Observations/Objective: Awake, alert, oriented x3 No acute distress   Lipid Panel     Component Value Date/Time   CHOL 145 09/26/2017 0957   TRIG 225 (H) 09/26/2017 0957   HDL 26 (L) 09/26/2017 0957   CHOLHDL 5.6 (H) 09/26/2017 0957   CHOLHDL 7.8 11/27/2015 0453   VLDL 66 (H) 11/27/2015 0453   LDLCALC 74 09/26/2017 0957   LABVLDL 45 (H) 09/26/2017 0957    CMP Latest Ref Rng & Units 01/14/2019 12/30/2018 10/24/2018  Glucose 65 - 99 mg/dL - 102(H) 102(H)  BUN 6 - 24 mg/dL - 9 10  Creatinine 0.57 - 1.00 mg/dL - 0.95 0.99  Sodium 135 - 145 mmol/L 140 141 138  Potassium 3.5 - 5.1 mmol/L 4.6 5.0 4.1  Chloride 96 - 106 mmol/L - 104 107  CO2 20 - 29 mmol/L - 23 22  Calcium 8.7 - 10.2 mg/dL - 10.1 9.3  Total Protein 6.5 - 8.1 g/dL - - -  Total Bilirubin 0.3 - 1.2 mg/dL - - -  Alkaline Phos 38 - 126 U/L - - -  AST 15 - 41 U/L - - -  ALT 0 - 44 U/L - - -     Assessment and Plan: 1. Hot flash, menopausal Stable - gabapentin (NEURONTIN) 300 MG capsule; TAKE 2 CAPSULES BY MOUTH 2 TIMES DAILY.  Dispense: 120 capsule; Refill: 6  2. Essential hypertension Controlled Counseled on blood pressure goal of less than 130/80, low-sodium, DASH diet, medication compliance, 150 minutes of moderate intensity exercise per week. Discussed medication compliance, adverse effects. - lisinopril (ZESTRIL) 5 MG tablet; Take 1 tablet (5 mg total) by mouth daily.  Dispense: 30 tablet; Refill: 6 - carvedilol (COREG) 3.125 MG tablet; Take 1 tablet (3.125 mg total) by mouth 2 (two) times daily with a meal.  Dispense: 60 tablet; Refill: 6 - CMP14+EGFR; Future - Lipid panel; Future  3. CAD S/P DES PCI TO mLAD - Synergy DES 3.0 mm x 12 mm No angina Risk factor modification including smoking cessation Continue statin - atorvastatin (LIPITOR) 40 MG tablet; TAKE 1 TABLET BY MOUTH DAILY AT 6 PM.  Dispense: 30 tablet; Refill: 6 - nitroGLYCERIN (NITROSTAT) 0.4 MG SL tablet; Place 1 tablet (0.4 mg  total) under the tongue every 5 (five) minutes as needed for chest pain. X 3 doses  Dispense: 25 tablet; Refill: 1  4. Sciatica of right side Uncontrolled Placed on meloxicam and tizanidine Will refer for physical therapy if symptoms persist meanwhile she has been counseled on performing home stretching exercises - tiZANidine (ZANAFLEX) 4 MG tablet; Take 1 tablet (4 mg total) by mouth every 8 (eight) hours as needed for muscle spasms.  Dispense: 60 tablet; Refill: 1 - meloxicam (MOBIC) 7.5 MG tablet; Take 1 tablet (7.5 mg total) by mouth daily.  Dispense: 30 tablet; Refill: 1  5. Encounter for screening mammogram for malignant neoplasm of breast - MM 3D SCREEN BREAST BILATERAL; Future   Follow Up  Instructions: 3 months-in person   I discussed the assessment and treatment plan with the patient. The patient was provided an opportunity to ask questions and all were answered. The patient agreed with the plan and demonstrated an understanding of the instructions.   The patient was advised to call back or seek an in-person evaluation if the symptoms worsen or if the condition fails to improve as anticipated.     I provided 16 minutes total of non-face-to-face time during this encounter including median intraservice time, reviewing previous notes, investigations, ordering medications, medical decision making, coordinating care and patient verbalized understanding at the end of the visit.     Charlott Rakes, MD, FAAFP. [facility] [place], [place]   05/14/2019, 10:46 AM

## 2019-05-14 NOTE — Progress Notes (Signed)
Patient has been called and DOB has been verified. Patient has been screened and transferred to PCP to start phone visit.    Medication refills  Sciatic pain.

## 2019-05-18 ENCOUNTER — Other Ambulatory Visit: Payer: Self-pay

## 2019-05-18 ENCOUNTER — Ambulatory Visit: Payer: 59 | Attending: Family Medicine

## 2019-05-18 DIAGNOSIS — I10 Essential (primary) hypertension: Secondary | ICD-10-CM

## 2019-05-19 ENCOUNTER — Other Ambulatory Visit: Payer: Self-pay | Admitting: Family Medicine

## 2019-05-19 DIAGNOSIS — E875 Hyperkalemia: Secondary | ICD-10-CM

## 2019-05-19 LAB — LIPID PANEL
Chol/HDL Ratio: 6.7 ratio — ABNORMAL HIGH (ref 0.0–4.4)
Cholesterol, Total: 188 mg/dL (ref 100–199)
HDL: 28 mg/dL — ABNORMAL LOW (ref >39)
LDL Chol Calc (NIH): 97 mg/dL (ref 0–99)
Triglycerides: 374 mg/dL — ABNORMAL HIGH (ref 0–149)
VLDL Cholesterol Cal: 63 mg/dL — ABNORMAL HIGH (ref 5–40)

## 2019-05-19 LAB — CMP14+EGFR
ALT: 12 IU/L (ref 0–32)
AST: 14 IU/L (ref 0–40)
Albumin/Globulin Ratio: 1.4 (ref 1.2–2.2)
Albumin: 4.1 g/dL (ref 3.8–4.9)
Alkaline Phosphatase: 76 IU/L (ref 39–117)
BUN/Creatinine Ratio: 12 (ref 9–23)
BUN: 12 mg/dL (ref 6–24)
Bilirubin Total: 0.3 mg/dL (ref 0.0–1.2)
CO2: 21 mmol/L (ref 20–29)
Calcium: 9.8 mg/dL (ref 8.7–10.2)
Chloride: 106 mmol/L (ref 96–106)
Creatinine, Ser: 1.03 mg/dL — ABNORMAL HIGH (ref 0.57–1.00)
GFR calc Af Amer: 73 mL/min/{1.73_m2} (ref >59)
GFR calc non Af Amer: 63 mL/min/{1.73_m2} (ref >59)
Globulin, Total: 2.9 g/dL (ref 1.5–4.5)
Glucose: 90 mg/dL (ref 65–99)
Potassium: 5.4 mmol/L — ABNORMAL HIGH (ref 3.5–5.2)
Sodium: 144 mmol/L (ref 134–144)
Total Protein: 7 g/dL (ref 6.0–8.5)

## 2019-05-26 ENCOUNTER — Ambulatory Visit: Payer: 59 | Admitting: Cardiovascular Disease

## 2019-06-12 MED FILL — GABAPENTIN 300 MG CAPSULE: 300 | 30 days supply | Qty: 120 | Fill #1

## 2019-06-12 MED FILL — ATORVASTATIN CALCIUM 40 MG: 40 | 30 days supply | Qty: 30 | Fill #1

## 2019-06-12 MED FILL — LISINOPRIL 5 MG TABLET: 5 | 30 days supply | Qty: 30 | Fill #1

## 2019-06-12 MED FILL — CARVEDILOL 3.125 MG TABLET: 3.125 | 30 days supply | Qty: 60 | Fill #1

## 2019-06-15 ENCOUNTER — Other Ambulatory Visit: Payer: Self-pay

## 2019-06-15 ENCOUNTER — Ambulatory Visit
Admission: RE | Admit: 2019-06-15 | Discharge: 2019-06-15 | Disposition: A | Discharge status: Home / Self Care | Payer: 59 | Source: Ambulatory Visit | Attending: Family Medicine | Admitting: Family Medicine

## 2019-06-15 DIAGNOSIS — Z1231 Encounter for screening mammogram for malignant neoplasm of breast: Secondary | ICD-10-CM

## 2019-06-29 ENCOUNTER — Encounter: Payer: Self-pay | Admitting: Cardiovascular Disease

## 2019-07-21 MED FILL — ATORVASTATIN CALCIUM 40 MG: 40 | 30 days supply | Qty: 30 | Fill #2

## 2019-07-21 MED FILL — VENTOLIN HFA 90 MCG INHALER: 108 (90 BAS | 25 days supply | Qty: 18 | Fill #1

## 2019-07-21 MED FILL — GABAPENTIN 300 MG CAPSULE: 300 | 30 days supply | Qty: 120 | Fill #2

## 2019-07-21 MED FILL — CARVEDILOL 3.125 MG TABLET: 3.125 | 30 days supply | Qty: 60 | Fill #2

## 2019-07-21 MED FILL — LISINOPRIL 5 MG TABLET: 5 | 30 days supply | Qty: 30 | Fill #2

## 2019-08-11 ENCOUNTER — Other Ambulatory Visit: Payer: Self-pay

## 2019-08-11 ENCOUNTER — Other Ambulatory Visit (HOSPITAL_COMMUNITY): Payer: Self-pay | Admitting: Cardiovascular Disease

## 2019-08-11 ENCOUNTER — Ambulatory Visit (HOSPITAL_COMMUNITY)
Admission: RE | Admit: 2019-08-11 | Discharge: 2019-08-11 | Disposition: A | Discharge status: Home / Self Care | Payer: 59 | Source: Ambulatory Visit | Attending: Cardiovascular Disease | Admitting: Cardiovascular Disease

## 2019-08-11 ENCOUNTER — Ambulatory Visit (HOSPITAL_BASED_OUTPATIENT_CLINIC_OR_DEPARTMENT_OTHER)
Admission: RE | Admit: 2019-08-11 | Discharge: 2019-08-11 | Disposition: A | Discharge status: Home / Self Care | Payer: 59 | Source: Ambulatory Visit | Attending: Cardiovascular Disease | Admitting: Cardiovascular Disease

## 2019-08-11 DIAGNOSIS — I739 Peripheral vascular disease, unspecified: Secondary | ICD-10-CM | POA: Diagnosis not present

## 2019-08-11 DIAGNOSIS — Z95828 Presence of other vascular implants and grafts: Secondary | ICD-10-CM

## 2019-09-08 ENCOUNTER — Ambulatory Visit: Payer: 59 | Admitting: Cardiovascular Disease

## 2019-09-08 ENCOUNTER — Other Ambulatory Visit: Payer: Self-pay

## 2019-09-08 ENCOUNTER — Encounter: Payer: Self-pay | Admitting: Cardiovascular Disease

## 2019-09-08 VITALS — BP 128/82 | HR 91 | Temp 97.1°F | Ht 65.0 in | Wt 169.0 lb

## 2019-09-08 DIAGNOSIS — Z720 Tobacco use: Secondary | ICD-10-CM

## 2019-09-08 DIAGNOSIS — E785 Hyperlipidemia, unspecified: Secondary | ICD-10-CM | POA: Diagnosis not present

## 2019-09-08 DIAGNOSIS — I2510 Atherosclerotic heart disease of native coronary artery without angina pectoris: Secondary | ICD-10-CM | POA: Diagnosis not present

## 2019-09-08 DIAGNOSIS — I739 Peripheral vascular disease, unspecified: Secondary | ICD-10-CM

## 2019-09-08 MED ORDER — ROSUVASTATIN CALCIUM 40 MG PO TABS: 40.0000 mg | ORAL_TABLET | Freq: Every day | ORAL | 3 refills | Status: DC

## 2019-09-08 NOTE — Progress Notes (Signed)
Cardiology Office Note   Date:  09/08/2019   ID:  Robin Walsh, DOB 20-Feb-1966, MRN [id number]  PCP:  Charlott Rakes, MD  Cardiologist:  Dr. Marlou Porch  No chief complaint on file.     History of Present Illness: Robin Walsh is a 54 y.o. female who presents for a follow-up visit regarding  peripheral arterial disease. She has known history of coronary artery disease, hypertension, hyperlipidemia and tobacco use. She is status post LAD PCI in 2017. She is status post bilateral common iliac artery kissing stent placement in 12/2015 for severe claudication. She had no significant infrainguinal disease. She had recurrent claudication last year for severe in-stent restenosis.  Preprocedure labs revealed severe anemia with a hemoglobin of 7.5 and thus the procedure was postponed.  She was hospitalized and transfused.  She was then seen by GI and had EGD and colonoscopy.  She had few polyps removed and she also reports having an anal fissure that was causing the bleeding.  She underwent angiography in October which showed severe in-stent restenosis in bilateral common iliac arteries.  I performed successful drug-coated balloon angioplasty to bilateral common iliac arteries.  Most recent noninvasive evaluation this month showed normal ABI bilaterally.  Duplex showed patent iliac stents with moderately elevated velocities.  She reports no significant leg claudication.  She is under significant stress due to death of her brother recently.   Past Medical History:  Diagnosis Date  . Anal fissure   . Anemia   . CAD (coronary artery disease)   . Hyperlipidemia   . Hypertension     Past Surgical History:  Procedure Laterality Date  . ABDOMINAL AORTOGRAM W/LOWER EXTREMITY N/A 01/14/2019   Procedure: ABDOMINAL AORTOGRAM W/LOWER EXTREMITY;  Surgeon: Wellington Hampshire, MD;  Location: [place] [facility];  Service: Cardiovascular;  Laterality: N/A;  limited  . CARDIAC CATHETERIZATION N/A  11/28/2015   Procedure: Left Heart Cath and Coronary Angiography;  Surgeon: Jettie Booze, MD;  Location: [place] [facility];  Service: Cardiovascular;  Laterality: N/A;  . CARDIAC CATHETERIZATION N/A 11/28/2015   Procedure: Coronary Stent Intervention;  Surgeon: Jettie Booze, MD;  Location: [place] [facility];  Service: Cardiovascular;  Laterality: N/A;  DES to Mid LAD; 3.0x12 Synergy  . LEG SURGERY Left 2002?   tibial plateau fracture  . PERIPHERAL VASCULAR BALLOON ANGIOPLASTY Bilateral 01/14/2019   Procedure: PERIPHERAL VASCULAR BALLOON ANGIOPLASTY;  Surgeon: Wellington Hampshire, MD;  Location: [place] [facility];  Service: Cardiovascular;  Laterality: Bilateral;  common iliac  . PERIPHERAL VASCULAR CATHETERIZATION N/A 01/04/2016   Procedure: Abdominal Aortogram;  Surgeon: Nelva Bush, MD;  Location: [place] [facility];  Service: Cardiovascular;  Laterality: N/A;  . PERIPHERAL VASCULAR CATHETERIZATION Bilateral 01/04/2016   Procedure: Lower Extremity Angiography;  Surgeon: Nelva Bush, MD;  Location: [place] [facility];  Service: Cardiovascular;  Laterality: Bilateral;  . PERIPHERAL VASCULAR CATHETERIZATION Bilateral 01/04/2016   Procedure: Peripheral Vascular Intervention;  Surgeon: Nelva Bush, MD;  Location: [place] [facility];  Service: Cardiovascular;  Laterality: Bilateral;  common iliac     Current Outpatient Medications  Medication Sig Dispense Refill  . acetaminophen (TYLENOL) 500 MG tablet Take 500 mg by mouth every 6 (six) hours as needed for headache (for headaches).     Marland Kitchen albuterol (VENTOLIN HFA) 108 (90 Base) MCG/ACT inhaler Inhale 2 puffs into the lungs every 6 (six) hours as needed for wheezing or shortness of breath. 18 g 1  . aspirin 81  MG chewable tablet Chew 1 tablet (81 mg total) by mouth daily. (Patient taking differently: Chew 81 mg by mouth every evening. ) 30 tablet 11  . carvedilol (COREG) 3.125 MG tablet Take 1 tablet (3.125 mg total) by mouth 2  (two) times daily with a meal. 60 tablet 6  . gabapentin (NEURONTIN) 300 MG capsule TAKE 2 CAPSULES BY MOUTH 2 TIMES DAILY. 120 capsule 6  . lisinopril (ZESTRIL) 5 MG tablet Take 1 tablet (5 mg total) by mouth daily. 30 tablet 6  . meloxicam (MOBIC) 7.5 MG tablet Take 1 tablet (7.5 mg total) by mouth daily. 30 tablet 1  . nitroGLYCERIN (NITROSTAT) 0.4 MG SL tablet Place 1 tablet (0.4 mg total) under the tongue every 5 (five) minutes as needed for chest pain. X 3 doses 25 tablet 1  . tiZANidine (ZANAFLEX) 4 MG tablet Take 1 tablet (4 mg total) by mouth every 8 (eight) hours as needed for muscle spasms. 60 tablet 1  . vitamin B-12 (CYANOCOBALAMIN) 500 MCG tablet Take 500 mcg by mouth daily.    . ferrous sulfate 325 (65 FE) MG EC tablet Take 1 tablet (325 mg total) by mouth 2 (two) times daily. 60 tablet 3  . rosuvastatin (CRESTOR) 40 MG tablet Take 1 tablet (40 mg total) by mouth daily. 90 tablet 3   No current facility-administered medications for this visit.    Allergies:   Chantix [varenicline tartrate], Codeine, and Dilaudid [hydromorphone hcl]    Social History:  The patient  reports that she has been smoking cigarettes. She has a 12.50 pack-year smoking history. She has never used smokeless tobacco. She reports current drug use. Drug: Marijuana. She reports that she does not drink alcohol.   Family History:  The patient's family history includes Alcohol abuse (age of onset: 66) in her father; Congestive Heart Failure (age of onset: 25) in her mother; Heart disease in her brother; Liver disease in her sister.    ROS:  Please see the history of present illness.   Otherwise, review of systems are positive for none.   All other systems are reviewed and negative.    PHYSICAL EXAM: VS:  BP 128/82   Pulse 91   Temp (!) 97.1 F (36.2 C)   Ht 5\' 5"  (1.651 m)   Wt 169 lb (76.7 kg)   SpO2 98%   BMI 28.12 kg/m  , BMI Body mass index is 28.12 kg/m. GEN: Well nourished, well developed, in no  acute distress  HEENT: normal  Neck: no JVD, carotid bruits, or masses Cardiac: RRR; no murmurs, rubs, or gallops,no edema  Respiratory:  clear to auscultation bilaterally, normal work of breathing GI: soft, nontender, nondistended, + BS MS: no deformity or atrophy  Skin: warm and dry, no rash Neuro:  Strength and sensation are intact Psych: euthymic mood, full affect Vascular: Femoral pulses: +1 bilaterally.   EKG:  EKG is ordered today. EKG showed normal sinus rhythm with no significant ST or T wave changes.  Recent Labs: 12/30/2018: Platelets 306 01/14/2019: Hemoglobin 14.6 05/18/2019: ALT 12; BUN 12; Creatinine, Ser 1.03; Potassium 5.4; Sodium 144    Lipid Panel    Component Value Date/Time   CHOL 188 05/18/2019 0849   TRIG 374 (H) 05/18/2019 0849   HDL 28 (L) 05/18/2019 0849   CHOLHDL 6.7 (H) 05/18/2019 0849   CHOLHDL 7.8 11/27/2015 0453   VLDL 66 (H) 11/27/2015 0453   LDLCALC 97 05/18/2019 0849      Wt Readings from Last  3 Encounters:  09/08/19 169 lb (76.7 kg)  01/14/19 160 lb (72.6 kg)  12/30/18 166 lb 6.4 oz (75.5 kg)       No flowsheet data found.    ASSESSMENT AND PLAN:  1.  Peripheral arterial disease: Status post drug-coated balloon angioplasty for severe bilateral common iliac artery in-stent restenosis.  No significant claudication at the present time.  Unfortunately, she is at high risk for restenosis given continued smoking and uncontrolled hyperlipidemia.   2. Tobacco use: Unfortunately, she has not been able to quit smoking and currently smokes half a pack per day.  I discussed with her the association of restenosis with continued tobacco use.  3. Coronary artery disease involving native coronary arteries with other forms of angina:   Continue medical therapy.  4. Hyperlipidemia: Reviewed most recent lipid profile which showed an LDL of 97.  Will need to be more aggressive and try to get her LDL below 70.  Thus, I switch atorvastatin to  rosuvastatin 40 mg daily.  Repeat lipid and liver profile in 2 months.  If LDL remains above 70, I recommend adding Zetia.   Disposition:   FU with me in 6 months  Signed,  [name], MD  09/08/2019 5:16 PM    [place]

## 2019-09-08 NOTE — Patient Instructions (Signed)
Medication Instructions:  Stop Atorvastatin  Start Rosuvastatin 40 mg daily    *If you need a refill on your cardiac medications before your next appointment, please call your pharmacy*   Lab Work: Lipid and Liver in 2 months   If you have labs (blood work) drawn today and your tests are completely normal, you will receive your results only by: Marland Kitchen MyChart Message (if you have MyChart) OR . A paper copy in the mail If you have any lab test that is abnormal or we need to change your treatment, we will call you to review the results.   Follow-Up: At [facility], you and your health needs are our priority.  As part of our continuing mission to provide you with exceptional heart care, we have created designated Provider Care Teams.  These Care Teams include your primary Cardiologist (physician) and Advanced Practice Providers (APPs -  Physician Assistants and Nurse Practitioners) who all work together to provide you with the care you need, when you need it.  We recommend signing up for the patient portal called "MyChart".  Sign up information is provided on this After Visit Summary.  MyChart is used to connect with patients for Virtual Visits (Telemedicine).  Patients are able to view lab/test results, encounter notes, upcoming appointments, etc.  Non-urgent messages can be sent to your provider as well.   To learn more about what you can do with MyChart, go to [email or web address].    Your next appointment:   6 month(s)  The format for your next appointment:   In Person  Provider:   [name], MD

## 2019-09-23 MED FILL — ROSUVASTATIN CALCIUM 40 MG: 40 | 30 days supply | Qty: 30 | Fill #0

## 2019-09-23 MED FILL — GABAPENTIN 300 MG CAPSULE: 300 | 30 days supply | Qty: 120 | Fill #4

## 2019-09-23 MED FILL — LISINOPRIL 5 MG TABLET: 5 | 30 days supply | Qty: 30 | Fill #4

## 2019-09-23 MED FILL — CARVEDILOL 3.125 MG TABLET: 3.125 | 30 days supply | Qty: 60 | Fill #4

## 2019-10-26 MED FILL — LISINOPRIL 5 MG TABLET: 5 | 30 days supply | Qty: 30 | Fill #5

## 2019-10-26 MED FILL — GABAPENTIN 300 MG CAPSULE: 300 | 30 days supply | Qty: 120 | Fill #5

## 2019-10-26 MED FILL — ROSUVASTATIN CALCIUM 40 MG: 40 | 30 days supply | Qty: 30 | Fill #1

## 2019-10-26 MED FILL — CARVEDILOL 3.125 MG TABLET: 3.125 | 30 days supply | Qty: 60 | Fill #5

## 2019-11-24 MED FILL — ROSUVASTATIN CALCIUM 40 MG: 40 | 30 days supply | Qty: 30 | Fill #2

## 2019-11-24 MED FILL — GABAPENTIN 300 MG CAPSULE: 300 | 30 days supply | Qty: 120 | Fill #6

## 2019-11-24 MED FILL — LISINOPRIL 5 MG TABLET: 5 | 30 days supply | Qty: 30 | Fill #6

## 2019-11-24 MED FILL — CARVEDILOL 3.125 MG TABLET: 3.125 | 30 days supply | Qty: 60 | Fill #6

## 2020-01-01 ENCOUNTER — Other Ambulatory Visit: Payer: Self-pay | Admitting: Family Medicine

## 2020-01-01 DIAGNOSIS — I10 Essential (primary) hypertension: Secondary | ICD-10-CM

## 2020-01-01 DIAGNOSIS — Z9861 Coronary angioplasty status: Secondary | ICD-10-CM

## 2020-01-01 DIAGNOSIS — N951 Menopausal and female climacteric states: Secondary | ICD-10-CM

## 2020-01-01 MED ORDER — LISINOPRIL 5 MG PO TABS: 5.0000 mg | ORAL_TABLET | Freq: Every day | ORAL | 0 refills | Status: DC

## 2020-01-01 MED ORDER — ROSUVASTATIN CALCIUM 40 MG PO TABS: 40.0000 mg | ORAL_TABLET | Freq: Every day | ORAL | 1 refills | Status: DC

## 2020-01-01 MED ORDER — GABAPENTIN 300 MG PO CAPS: ORAL_CAPSULE | ORAL | 1 refills | Status: DC

## 2020-01-01 MED ORDER — NITROGLYCERIN 0.4 MG SL SUBL: 0.4000 mg | SUBLINGUAL_TABLET | SUBLINGUAL | 1 refills | Status: DC | PRN

## 2020-01-01 MED ORDER — CARVEDILOL 3.125 MG PO TABS: 3.1250 mg | ORAL_TABLET | Freq: Two times a day (BID) | ORAL | 0 refills | Status: DC

## 2020-01-01 NOTE — Telephone Encounter (Signed)
Medication Refill - Medication: gabapentin (NEURONTIN) 300 MG capsule lisinopril (ZESTRIL) 5 MG tablet carvedilol (COREG) 3.125 MG tablet rosuvastatin (CRESTOR) 40 MG tablet  Patient has an appt. In November.  Would like courtesy refills until her appt.   Preferred Pharmacy (with phone number or street name):  [facility] [place] [address] Phone:  [phone]  Fax:  [phone]       Agent: Please be advised that RX refills may take up to 3 business days. We ask that you follow-up with your pharmacy.

## 2020-01-04 MED FILL — GABAPENTIN 300 MG CAPSULE: 300 | 30 days supply | Qty: 120 | Fill #0

## 2020-01-04 MED FILL — LISINOPRIL 5 MG TABLET: 5 | 30 days supply | Qty: 30 | Fill #0

## 2020-01-04 MED FILL — ROSUVASTATIN CALCIUM 40 MG: 40 | 30 days supply | Qty: 30 | Fill #0

## 2020-01-04 MED FILL — CARVEDILOL 3.125 MG TABLET: 3.125 | 30 days supply | Qty: 60 | Fill #0

## 2020-01-18 ENCOUNTER — Encounter: Payer: Self-pay | Admitting: *Deleted

## 2020-01-23 ENCOUNTER — Ambulatory Visit: Payer: 59

## 2020-02-01 ENCOUNTER — Other Ambulatory Visit: Payer: Self-pay

## 2020-02-01 ENCOUNTER — Ambulatory Visit (HOSPITAL_COMMUNITY)
Admission: EM | Admit: 2020-02-01 | Discharge: 2020-02-01 | Disposition: A | Discharge status: Home / Self Care | Payer: 59 | Attending: Emergency Medicine | Admitting: Emergency Medicine

## 2020-02-01 ENCOUNTER — Encounter (HOSPITAL_COMMUNITY): Payer: Self-pay | Admitting: Emergency Medicine

## 2020-02-01 DIAGNOSIS — J209 Acute bronchitis, unspecified: Secondary | ICD-10-CM

## 2020-02-01 MED ORDER — PREDNISONE 50 MG PO TABS
50.0000 mg | ORAL_TABLET | Freq: Every day | ORAL | 0 refills | Status: AC
Start: 2020-02-01 — End: 2020-02-06

## 2020-02-01 MED ORDER — HYDROCODONE-CHLORPHENIRAMINE 5-4 MG/5ML PO SOLN
5.0000 mL | Freq: Every evening | ORAL | 0 refills | Status: DC | PRN
Start: 2020-02-01 — End: 2020-09-06

## 2020-02-01 MED ORDER — BENZONATATE 200 MG PO CAPS: 200.0000 mg | ORAL_CAPSULE | Freq: Three times a day (TID) | ORAL | 0 refills | Status: AC | PRN

## 2020-02-01 MED ORDER — DOXYCYCLINE HYCLATE 100 MG PO CAPS: 100.0000 mg | ORAL_CAPSULE | Freq: Two times a day (BID) | ORAL | 0 refills | Status: AC

## 2020-02-01 MED ORDER — ALBUTEROL SULFATE HFA 108 (90 BASE) MCG/ACT IN AERS: 1.0000 | INHALATION_SPRAY | Freq: Four times a day (QID) | RESPIRATORY_TRACT | 0 refills | Status: DC | PRN

## 2020-02-01 NOTE — ED Triage Notes (Signed)
Pt c/o productive cought with yellow phlegm and mucous x 1 week. Pt states she has a lot of drainage that keeps going down into her chest. She has been taking nyquil, sudafed and robitussin.

## 2020-02-01 NOTE — ED Provider Notes (Signed)
[name]    CSN: [id number] Arrival date & time: 02/01/20  1521      History   Chief Complaint Chief Complaint  Patient presents with  . Sinus Problem  . Cough    HPI Robin Walsh is a 54 y.o. female history of hypertension, hyperlipidemia, CAD, tobacco use, presenting today for evaluation of cough and congestion.  Patient reports that she has had cough and congestion for over 1 week.  Feels very similar to prior sinus infection she has had.  She denies any fevers.  Reports mucus has become discolored and foul tasting.  Using over-the-counter NyQuil Sudafed and Robitussin without relief.  Has had some wheezing.  Requesting nighttime cough medicine-used Tussionex in the past with relief.  HPI  Past Medical History:  Diagnosis Date  . Anal fissure   . Anemia   . CAD (coronary artery disease)   . Hyperlipidemia   . Hypertension     Patient Active Problem List   Diagnosis Date Noted  . Symptomatic anemia 10/23/2018  . Hemorrhoids 10/23/2018  . Leukocytosis 10/23/2018  . Renal insufficiency 10/23/2018  . Tobacco abuse 10/23/2018  . Vitamin D deficiency 02/11/2018  . Hot flash, menopausal 02/06/2017  . PAD (peripheral artery disease) ([place]) 01/04/2016  . Medication management 12/09/2015  . Chest pain   . Abnormal finding on EKG - anterior T-wave inversions concerning for LAD ischemia 11/28/2015  . CAD S/P DES PCI TO mLAD - Synergy DES 3.0 mm x 12 mm 11/28/2015  . Unstable angina ([place]) 11/27/2015  . Essential hypertension 11/27/2015  . Dyslipidemia, goal LDL below 70 11/27/2015  . Tobacco dependence 11/27/2015  . Noncompliance 11/27/2015  . Marijuana dependence ([place]) 11/27/2015  . Right hip pain 11/27/2015    Past Surgical History:  Procedure Laterality Date  . ABDOMINAL AORTOGRAM W/LOWER EXTREMITY N/A 01/14/2019   Procedure: ABDOMINAL AORTOGRAM W/LOWER EXTREMITY;  Surgeon: Wellington Hampshire, MD;  Location: [facility];  Service:  Cardiovascular;  Laterality: N/A;  limited  . CARDIAC CATHETERIZATION N/A 11/28/2015   Procedure: Left Heart Cath and Coronary Angiography;  Surgeon: Jettie Booze, MD;  Location: [facility];  Service: Cardiovascular;  Laterality: N/A;  . CARDIAC CATHETERIZATION N/A 11/28/2015   Procedure: Coronary Stent Intervention;  Surgeon: Jettie Booze, MD;  Location: [facility];  Service: Cardiovascular;  Laterality: N/A;  DES to Mid LAD; 3.0x12 Synergy  . LEG SURGERY Left 2002?   tibial plateau fracture  . PERIPHERAL VASCULAR BALLOON ANGIOPLASTY Bilateral 01/14/2019   Procedure: PERIPHERAL VASCULAR BALLOON ANGIOPLASTY;  Surgeon: Wellington Hampshire, MD;  Location: [facility];  Service: Cardiovascular;  Laterality: Bilateral;  common iliac  . PERIPHERAL VASCULAR CATHETERIZATION N/A 01/04/2016   Procedure: Abdominal Aortogram;  Surgeon: Nelva Bush, MD;  Location: [facility];  Service: Cardiovascular;  Laterality: N/A;  . PERIPHERAL VASCULAR CATHETERIZATION Bilateral 01/04/2016   Procedure: Lower Extremity Angiography;  Surgeon: Nelva Bush, MD;  Location: [facility];  Service: Cardiovascular;  Laterality: Bilateral;  . PERIPHERAL VASCULAR CATHETERIZATION Bilateral 01/04/2016   Procedure: Peripheral Vascular Intervention;  Surgeon: Nelva Bush, MD;  Location: [facility];  Service: Cardiovascular;  Laterality: Bilateral;  common iliac    OB History    Gravida  1   Para  0   Term      Preterm      AB  1   Living        SAB  TAB  1   Ectopic      Multiple      Live Births               Home Medications    Prior to Admission medications   Medication Sig Start Date End Date Taking? Authorizing Provider  acetaminophen (TYLENOL) 500 MG tablet Take 500 mg by mouth every 6 (six) hours as needed for headache (for headaches).    Yes [provider]  aspirin 81 MG chewable tablet Chew 1 tablet (81 mg total) by  mouth daily. Patient taking differently: Chew 81 mg by mouth every evening.  02/26/17  Yes Charlott Rakes, MD  carvedilol (COREG) 3.125 MG tablet Take 1 tablet (3.125 mg total) by mouth 2 (two) times daily with a meal. 01/01/20  Yes Newlin, Enobong, MD  gabapentin (NEURONTIN) 300 MG capsule TAKE 2 CAPSULES BY MOUTH 2 TIMES DAILY. 01/01/20  Yes Newlin, Charlane Ferretti, MD  lisinopril (ZESTRIL) 5 MG tablet Take 1 tablet (5 mg total) by mouth daily. 01/01/20  Yes Charlott Rakes, MD  meloxicam (MOBIC) 7.5 MG tablet Take 1 tablet (7.5 mg total) by mouth daily. 05/14/19  Yes Charlott Rakes, MD  nitroGLYCERIN (NITROSTAT) 0.4 MG SL tablet Place 1 tablet (0.4 mg total) under the tongue every 5 (five) minutes as needed for chest pain. X 3 doses 01/01/20  Yes Newlin, Enobong, MD  rosuvastatin (CRESTOR) 40 MG tablet Take 1 tablet (40 mg total) by mouth daily. 01/01/20 03/31/20 Yes Charlott Rakes, MD  tiZANidine (ZANAFLEX) 4 MG tablet Take 1 tablet (4 mg total) by mouth every 8 (eight) hours as needed for muscle spasms. 05/14/19  Yes Charlott Rakes, MD  vitamin B-12 (CYANOCOBALAMIN) 500 MCG tablet Take 500 mcg by mouth daily.   Yes [provider]  albuterol (VENTOLIN HFA) 108 (90 Base) MCG/ACT inhaler Inhale 1-2 puffs into the lungs every 6 (six) hours as needed for wheezing or shortness of breath. 02/01/20   Brealynn Contino C, PA-C  benzonatate (TESSALON) 200 MG capsule Take 1 capsule (200 mg total) by mouth 3 (three) times daily as needed for up to 7 days for cough. 02/01/20 02/08/20  Khrystyne Arpin C, PA-C  doxycycline (VIBRAMYCIN) 100 MG capsule Take 1 capsule (100 mg total) by mouth 2 (two) times daily for 7 days. 02/01/20 02/08/20  Deanna Boehlke C, PA-C  ferrous sulfate 325 (65 FE) MG EC tablet Take 1 tablet (325 mg total) by mouth 2 (two) times daily. 10/24/18 02/21/19  Barb Merino, MD  HYDROcodone-Chlorpheniramine 5-4 MG/5ML SOLN Take 5 mLs by mouth at bedtime as needed (cough). 02/01/20   Hadleigh Felber  C, PA-C  predniSONE (DELTASONE) 50 MG tablet Take 1 tablet (50 mg total) by mouth daily with breakfast for 5 days. 02/01/20 02/06/20  Julieana Eshleman, Elesa Hacker, PA-C    Family History Family History  Problem Relation Age of Onset  . Congestive Heart Failure Mother 34  . Alcohol abuse Father 66  . Heart disease Brother   . Liver disease Sister        kidney/liver    Social History Social History   Tobacco Use  . Smoking status: Current Every Day Smoker    Packs/day: 0.50    Years: 25.00    Pack years: 12.50    Types: Cigarettes  . Smokeless tobacco: Never Used  Vaping Use  . Vaping Use: Never used  Substance Use Topics  . Alcohol use: No  . Drug use: Yes    Types: Marijuana  Comment: one week ago     Allergies   Chantix [varenicline tartrate], Codeine, and Dilaudid [hydromorphone hcl]   Review of Systems Review of Systems  Constitutional: Negative for activity change, appetite change, chills, fatigue and fever.  HENT: Positive for congestion, rhinorrhea and sinus pressure. Negative for ear pain, sore throat and trouble swallowing.   Eyes: Negative for discharge and redness.  Respiratory: Positive for cough. Negative for chest tightness and shortness of breath.   Cardiovascular: Negative for chest pain.  Gastrointestinal: Negative for abdominal pain, diarrhea, nausea and vomiting.  Musculoskeletal: Negative for myalgias.  Skin: Negative for rash.  Neurological: Negative for dizziness, light-headedness and headaches.     Physical Exam Triage Vital Signs ED Triage Vitals  Enc Vitals Group     BP 02/01/20 1646 (!) 145/79     Pulse Rate 02/01/20 1646 79     Resp 02/01/20 1646 19     Temp 02/01/20 1646 97.6 F (36.4 C)     Temp Source 02/01/20 1646 Oral     SpO2 02/01/20 1646 98 %     Weight --      Height --      Head Circumference --      Peak Flow --      Pain Score 02/01/20 1642 0     Pain Loc --      Pain Edu? --      Excl. in [place]? --    No data  found.  Updated Vital Signs BP (!) 145/79 (BP Location: Left Arm)   Pulse 79   Temp 97.6 F (36.4 C) (Oral)   Resp 19   LMP 03/26/2017   SpO2 98%   Visual Acuity Right Eye Distance:   Left Eye Distance:   Bilateral Distance:    Right Eye Near:   Left Eye Near:    Bilateral Near:     Physical Exam Vitals and nursing note reviewed.  Constitutional:      Appearance: She is well-developed.     Comments: No acute distress  HENT:     Head: Normocephalic and atraumatic.     Ears:     Comments: Bilateral ears without tenderness to palpation of external auricle, tragus and mastoid, EAC's without erythema or swelling, TM's with good bony landmarks and cone of light. Non erythematous.     Nose: Nose normal.     Mouth/Throat:     Comments: Oral mucosa pink and moist, no tonsillar enlargement or exudate. Posterior pharynx patent and nonerythematous, no uvula deviation or swelling. Normal phonation. Eyes:     Conjunctiva/sclera: Conjunctivae normal.  Cardiovascular:     Rate and Rhythm: Normal rate.  Pulmonary:     Effort: Pulmonary effort is normal. No respiratory distress.     Comments: Breathing comfortably at rest, end expiratory wheeze/rhonchi noted bilaterally Abdominal:     General: There is no distension.  Musculoskeletal:        General: Normal range of motion.     Cervical back: Neck supple.  Skin:    General: Skin is warm and dry.  Neurological:     Mental Status: She is alert and oriented to person, place, and time.      UC Treatments / Results  Labs (all labs ordered are listed, but only abnormal results are displayed) Labs Reviewed - No data to display  EKG   Radiology No results found.  Procedures Procedures (including critical care time)  Medications Ordered in UC Medications - No data to  display  Initial Impression / Assessment and Plan / UC Course  I have reviewed the triage vital signs and the nursing notes.  Pertinent labs & imaging  results that were available during my care of the patient were reviewed by me and considered in my medical decision making (see chart for details).     Treating for bronchitis-doxycycline, albuterol and prednisone.  Tessalon for daytime cough, Tussionex for nighttime cough.  Rest and fluids.  Discussed strict return precautions. Patient verbalized understanding and is agreeable with plan.  Final Clinical Impressions(s) / UC Diagnoses   Final diagnoses:  Acute bronchitis, unspecified organism     Discharge Instructions     Doxycycline twice daily for 1 week Albuterol inhaler as needed for shortness of breath chest tightness and wheezing Prednisone daily for 5 days-take with food in the morning if you are able Tessalon for daytime cough Tussionex cough syrup at bedtime-do not drive or work after taking Rest and fluids Follow-up if not improving or worsening    ED Prescriptions    Medication Sig Dispense Auth. Provider   doxycycline (VIBRAMYCIN) 100 MG capsule Take 1 capsule (100 mg total) by mouth 2 (two) times daily for 7 days. 14 capsule Waqas Bruhl C, PA-C   predniSONE (DELTASONE) 50 MG tablet Take 1 tablet (50 mg total) by mouth daily with breakfast for 5 days. 5 tablet Paulmichael Schreck C, PA-C   albuterol (VENTOLIN HFA) 108 (90 Base) MCG/ACT inhaler Inhale 1-2 puffs into the lungs every 6 (six) hours as needed for wheezing or shortness of breath. 18 g Jearlene Bridwell C, PA-C   benzonatate (TESSALON) 200 MG capsule Take 1 capsule (200 mg total) by mouth 3 (three) times daily as needed for up to 7 days for cough. 28 capsule Jeania Nater C, PA-C   HYDROcodone-Chlorpheniramine 5-4 MG/5ML SOLN Take 5 mLs by mouth at bedtime as needed (cough). 75 mL Keldon Lassen, [place] C, PA-C     I have reviewed the PDMP during this encounter.   Janith Lima, [place] 02/01/20 1720

## 2020-02-01 NOTE — Discharge Instructions (Signed)
Doxycycline twice daily for 1 week Albuterol inhaler as needed for shortness of breath chest tightness and wheezing Prednisone daily for 5 days-take with food in the morning if you are able Tessalon for daytime cough Tussionex cough syrup at bedtime-do not drive or work after taking Rest and fluids Follow-up if not improving or worsening

## 2020-02-04 ENCOUNTER — Other Ambulatory Visit: Payer: Self-pay | Admitting: Family Medicine

## 2020-02-04 DIAGNOSIS — I10 Essential (primary) hypertension: Secondary | ICD-10-CM

## 2020-02-04 MED FILL — LISINOPRIL 5 MG TABLET: 5 | 30 days supply | Qty: 30 | Fill #1

## 2020-02-04 MED FILL — MELOXICAM 7.5 MG TABLET: 7.5 | 30 days supply | Qty: 30 | Fill #1

## 2020-02-04 MED FILL — GABAPENTIN 300 MG CAPSULE: 300 | 30 days supply | Qty: 120 | Fill #1

## 2020-02-04 MED FILL — ROSUVASTATIN CALCIUM 40 MG: 40 | 30 days supply | Qty: 30 | Fill #1

## 2020-02-04 NOTE — Telephone Encounter (Signed)
Pt has appt 02/22/20   No shows last 2 appt. Courtesy refill to appt date

## 2020-02-05 MED FILL — CARVEDILOL 3.125 MG TABLET: 3.125 | 19 days supply | Qty: 38 | Fill #0

## 2020-02-22 ENCOUNTER — Encounter: Payer: Self-pay | Admitting: Family Medicine

## 2020-02-22 ENCOUNTER — Ambulatory Visit: Payer: 59 | Attending: Family Medicine | Admitting: Family Medicine

## 2020-02-22 ENCOUNTER — Other Ambulatory Visit: Payer: Self-pay | Admitting: Family Medicine

## 2020-02-22 ENCOUNTER — Other Ambulatory Visit: Payer: Self-pay

## 2020-02-22 VITALS — BP 115/71 | HR 81 | Ht 65.0 in | Wt 168.0 lb

## 2020-02-22 DIAGNOSIS — I2510 Atherosclerotic heart disease of native coronary artery without angina pectoris: Secondary | ICD-10-CM

## 2020-02-22 DIAGNOSIS — I10 Essential (primary) hypertension: Secondary | ICD-10-CM | POA: Diagnosis not present

## 2020-02-22 DIAGNOSIS — N951 Menopausal and female climacteric states: Secondary | ICD-10-CM | POA: Diagnosis not present

## 2020-02-22 DIAGNOSIS — J40 Bronchitis, not specified as acute or chronic: Secondary | ICD-10-CM

## 2020-02-22 DIAGNOSIS — I739 Peripheral vascular disease, unspecified: Secondary | ICD-10-CM

## 2020-02-22 DIAGNOSIS — Z9861 Coronary angioplasty status: Secondary | ICD-10-CM

## 2020-02-22 MED ORDER — CARVEDILOL 3.125 MG PO TABS: 3.1250 mg | ORAL_TABLET | Freq: Two times a day (BID) | ORAL | 1 refills | Status: DC

## 2020-02-22 MED ORDER — LISINOPRIL 5 MG PO TABS: 5.0000 mg | ORAL_TABLET | Freq: Every day | ORAL | 1 refills | Status: DC

## 2020-02-22 MED ORDER — VENLAFAXINE HCL ER 75 MG PO CP24: 75.0000 mg | ORAL_CAPSULE | Freq: Every day | ORAL | 6 refills | Status: DC

## 2020-02-22 MED ORDER — ROSUVASTATIN CALCIUM 40 MG PO TABS: 40.0000 mg | ORAL_TABLET | Freq: Every day | ORAL | 1 refills | Status: DC

## 2020-02-22 MED ORDER — BENZONATATE 100 MG PO CAPS: 100.0000 mg | ORAL_CAPSULE | Freq: Three times a day (TID) | ORAL | 0 refills | Status: DC | PRN

## 2020-02-22 MED ORDER — GABAPENTIN 300 MG PO CAPS: ORAL_CAPSULE | ORAL | 6 refills | Status: DC

## 2020-02-22 MED FILL — BENZONATATE 100 MG CAPS: 100 | 10 days supply | Qty: 30 | Fill #0

## 2020-02-22 MED FILL — VENLAFAXINE HCL ER 75 MG CA: 75 | 30 days supply | Qty: 30 | Fill #0

## 2020-02-22 NOTE — Progress Notes (Signed)
Subjective:  Patient ID: Robin Walsh, female    DOB: 01-17-1966  Age: 54 y.o. MRN: [id number]  CC: Hypertension   HPI Robin Walsh is a 54 year old female with a history of tobacco abuse, coronary artery disease (status post PCI and drug-eluting stent placement to LAD in 11/2015) peripheral arterial disease (status post stenting of bilateral common iliac artery) who presents today for follow-up visit She has had 1 month history of cough with associated hoarseness and having to clear her throat. Denies presence of sinus symptoms. 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 Received Doxycycline, Prednisone, Tessalon perles and an MDI at an Urgent Care visit on 02/01/20  Her hot flashes are not controlled on gabapentin and she would like to try something else.  She had spoken with a friend of hers who said oxybutynin had worked for her hot flashes and she is wanting to try this.  She endorses minimal symptoms of stress urinary incontinence but the symptoms are not bothersome.  Denies presence of chest pain, dyspnea and endorses compliance with her cardiac medications.  She does have intermittent claudication and is followed by vascular.  Continues to smoke and is not ready to quit yet.  Past Medical History:  Diagnosis Date  . Anal fissure   . Anemia   . CAD (coronary artery disease)   . Hyperlipidemia   . Hypertension     Past Surgical History:  Procedure Laterality Date  . ABDOMINAL AORTOGRAM W/LOWER EXTREMITY N/A 01/14/2019   Procedure: ABDOMINAL AORTOGRAM W/LOWER EXTREMITY;  Surgeon: Wellington Hampshire, MD;  Location: [facility];  Service: Cardiovascular;  Laterality: N/A;  limited  . CARDIAC CATHETERIZATION N/A 11/28/2015   Procedure: Left Heart Cath and Coronary Angiography;  Surgeon: Jettie Booze, MD;  Location: [facility];  Service: Cardiovascular;  Laterality: N/A;  . CARDIAC CATHETERIZATION N/A 11/28/2015   Procedure: Coronary  Stent Intervention;  Surgeon: Jettie Booze, MD;  Location: [facility];  Service: Cardiovascular;  Laterality: N/A;  DES to Mid LAD; 3.0x12 Synergy  . LEG SURGERY Left 2002?   tibial plateau fracture  . PERIPHERAL VASCULAR BALLOON ANGIOPLASTY Bilateral 01/14/2019   Procedure: PERIPHERAL VASCULAR BALLOON ANGIOPLASTY;  Surgeon: Wellington Hampshire, MD;  Location: [facility];  Service: Cardiovascular;  Laterality: Bilateral;  common iliac  . PERIPHERAL VASCULAR CATHETERIZATION N/A 01/04/2016   Procedure: Abdominal Aortogram;  Surgeon: Nelva Bush, MD;  Location: [facility];  Service: Cardiovascular;  Laterality: N/A;  . PERIPHERAL VASCULAR CATHETERIZATION Bilateral 01/04/2016   Procedure: Lower Extremity Angiography;  Surgeon: Nelva Bush, MD;  Location: [facility];  Service: Cardiovascular;  Laterality: Bilateral;  . PERIPHERAL VASCULAR CATHETERIZATION Bilateral 01/04/2016   Procedure: Peripheral Vascular Intervention;  Surgeon: Nelva Bush, MD;  Location: [facility];  Service: Cardiovascular;  Laterality: Bilateral;  common iliac    Family History  Problem Relation Age of Onset  . Congestive Heart Failure Mother 70  . Alcohol abuse Father 32  . Heart disease Brother   . Liver disease Sister        kidney/liver    Allergies  Allergen Reactions  . Chantix [Varenicline Tartrate] Nausea Only  . Codeine Nausea And Vomiting  . Dilaudid [Hydromorphone Hcl] Nausea And Vomiting    Outpatient Medications Prior to Visit  Medication Sig Dispense Refill  . acetaminophen (TYLENOL) 500 MG tablet Take 500 mg by mouth every 6 (six) hours as  needed for headache (for headaches).     Marland Kitchen albuterol (VENTOLIN HFA) 108 (90 Base) MCG/ACT inhaler Inhale 1-2 puffs into the lungs every 6 (six) hours as needed for wheezing or shortness of breath. 18 g 0  . aspirin 81 MG chewable tablet Chew 1 tablet (81 mg total) by mouth daily. (Patient taking differently: Chew  81 mg by mouth every evening. ) 30 tablet 11  . carvedilol (COREG) 3.125 MG tablet TAKE 1 TABLET (3.125 MG TOTAL) BY MOUTH 2 (TWO) TIMES DAILY WITH A MEAL. 38 tablet 0  . gabapentin (NEURONTIN) 300 MG capsule TAKE 2 CAPSULES BY MOUTH 2 TIMES DAILY. 120 capsule 1  . HYDROcodone-Chlorpheniramine 5-4 MG/5ML SOLN Take 5 mLs by mouth at bedtime as needed (cough). 75 mL 0  . lisinopril (ZESTRIL) 5 MG tablet Take 1 tablet (5 mg total) by mouth daily. 60 tablet 0  . meloxicam (MOBIC) 7.5 MG tablet Take 1 tablet (7.5 mg total) by mouth daily. 30 tablet 1  . nitroGLYCERIN (NITROSTAT) 0.4 MG SL tablet Place 1 tablet (0.4 mg total) under the tongue every 5 (five) minutes as needed for chest pain. X 3 doses 25 tablet 1  . rosuvastatin (CRESTOR) 40 MG tablet Take 1 tablet (40 mg total) by mouth daily. 90 tablet 1  . tiZANidine (ZANAFLEX) 4 MG tablet Take 1 tablet (4 mg total) by mouth every 8 (eight) hours as needed for muscle spasms. 60 tablet 1  . vitamin B-12 (CYANOCOBALAMIN) 500 MCG tablet Take 500 mcg by mouth daily.    . ferrous sulfate 325 (65 FE) MG EC tablet Take 1 tablet (325 mg total) by mouth 2 (two) times daily. 60 tablet 3   No facility-administered medications prior to visit.     ROS Review of Systems  Constitutional: Negative for activity change, appetite change and fatigue.  HENT: Negative for congestion, sinus pressure and sore throat.   Eyes: Negative for visual disturbance.  Respiratory: Positive for cough. Negative for chest tightness, shortness of breath and wheezing.   Cardiovascular: Negative for chest pain and palpitations.  Gastrointestinal: Negative for abdominal distention, abdominal pain and constipation.  Endocrine: Negative for polydipsia.  Genitourinary: Negative for dysuria and frequency.  Musculoskeletal: Negative for arthralgias and back pain.  Skin: Negative for rash.  Neurological: Negative for tremors, light-headedness and numbness.  Hematological: Does not  bruise/bleed easily.  Psychiatric/Behavioral: Negative for agitation and behavioral problems.    Objective:  BP 115/71   Pulse 81   Ht 5\' 5"  (1.651 m)   Wt 168 lb (76.2 kg)   LMP 03/26/2017   SpO2 98%   BMI 27.96 kg/m   BP/Weight 02/22/2020 57/84/6962 12/13/2839  Systolic BP 324 401 027  Diastolic BP 71 79 82  Wt. (Lbs) 168 - 169  BMI 27.96 - 28.12      Physical Exam Constitutional:      Appearance: She is well-developed.  Neck:     Vascular: No JVD.  Cardiovascular:     Rate and Rhythm: Normal rate.     Heart sounds: Normal heart sounds. No murmur heard.   Pulmonary:     Effort: Pulmonary effort is normal.     Breath sounds: Normal breath sounds. No wheezing or rales.  Chest:     Chest wall: No tenderness.  Abdominal:     General: Bowel sounds are normal. There is no distension.     Palpations: Abdomen is soft. There is no mass.     Tenderness: There is no abdominal  tenderness.  Musculoskeletal:        General: Normal range of motion.     Right lower leg: No edema.     Left lower leg: No edema.  Neurological:     Mental Status: She is alert and oriented to person, place, and time.  Psychiatric:        Mood and Affect: Mood normal.     CMP Latest Ref Rng & Units 05/18/2019 01/14/2019 12/30/2018  Glucose 65 - 99 mg/dL 90 - 102(H)  BUN 6 - 24 mg/dL 12 - 9  Creatinine 0.57 - 1.00 mg/dL 1.03(H) - 0.95  Sodium 134 - 144 mmol/L 144 140 141  Potassium 3.5 - 5.2 mmol/L 5.4(H) 4.6 5.0  Chloride 96 - 106 mmol/L 106 - 104  CO2 20 - 29 mmol/L 21 - 23  Calcium 8.7 - 10.2 mg/dL 9.8 - 10.1  Total Protein 6.0 - 8.5 g/dL 7.0 - -  Total Bilirubin 0.0 - 1.2 mg/dL 0.3 - -  Alkaline Phos 39 - 117 IU/L 76 - -  AST 0 - 40 IU/L 14 - -  ALT 0 - 32 IU/L 12 - -    Lipid Panel     Component Value Date/Time   CHOL 188 05/18/2019 0849   TRIG 374 (H) 05/18/2019 0849   HDL 28 (L) 05/18/2019 0849   CHOLHDL 6.7 (H) 05/18/2019 0849   CHOLHDL 7.8 11/27/2015 0453   VLDL 66 (H)  11/27/2015 0453   LDLCALC 97 05/18/2019 0849    CBC    Component Value Date/Time   WBC 7.2 12/30/2018 0912   WBC 9.4 10/24/2018 0533   RBC 5.12 12/30/2018 0912   RBC 4.07 10/24/2018 0533   HGB 14.6 01/14/2019 0928   HGB 14.1 12/30/2018 0912   HCT 43.0 01/14/2019 0928   HCT 43.7 12/30/2018 0912   PLT 306 12/30/2018 0912   MCV 85 12/30/2018 0912   MCH 27.5 12/30/2018 0912   MCH 21.4 (L) 10/24/2018 0533   MCHC 32.3 12/30/2018 0912   MCHC 29.3 (L) 10/24/2018 0533   RDW 19.9 (H) 12/30/2018 0912   LYMPHSABS 2.0 10/23/2018 1406   LYMPHSABS 3.3 (H) 02/11/2018 1624   MONOABS 0.7 10/23/2018 1406   EOSABS 0.3 10/23/2018 1406   EOSABS 0.5 (H) 02/11/2018 1624   BASOSABS 0.0 10/23/2018 1406   BASOSABS 0.0 02/11/2018 1624    Lab Results  Component Value Date   HGBA1C 5.8 (H) 11/27/2015    Assessment & Plan:  1. Essential hypertension Controlled Counseled on blood pressure goal of less than 130/80, low-sodium, DASH diet, medication compliance, 150 minutes of moderate intensity exercise per week. Discussed medication compliance, adverse effects. - lisinopril (ZESTRIL) 5 MG tablet; Take 1 tablet (5 mg total) by mouth daily.  Dispense: 90 tablet; Refill: 1 - carvedilol (COREG) 3.125 MG tablet; Take 1 tablet (3.125 mg total) by mouth 2 (two) times daily with a meal.  Dispense: 180 tablet; Refill: 1  2. Hot flash, menopausal Controlled Discussed at this time given she does not have overt symptoms of urinary incontinence oxybutynin would not be a good choice Trial of Effexor Continue Gabapentin Due to cardiac history of radiation HRT would not be a good choice. - venlafaxine XR (EFFEXOR XR) 75 MG 24 hr capsule; Take 1 capsule (75 mg total) by mouth daily with breakfast.  Dispense: 30 capsule; Refill: 6 - gabapentin (NEURONTIN) 300 MG capsule; TAKE 2 CAPSULES BY MOUTH 2 TIMES DAILY.  Dispense: 120 capsule; Refill: 6  3. Bronchitis  Symptoms have improved with residual  hoarseness Advised to use OTC Chloraseptic spray - benzonatate (TESSALON) 100 MG capsule; Take 1 capsule (100 mg total) by mouth 3 (three) times daily as needed for cough.  Dispense: 30 capsule; Refill: 0  4. CAD S/P DES PCI TO mLAD - Synergy DES 3.0 mm x 12 mm Asymptomatic Risk factor modification - rosuvastatin (CRESTOR) 40 MG tablet; Take 1 tablet (40 mg total) by mouth daily.  Dispense: 90 tablet; Refill: 1  5. PAD (peripheral artery disease) (HCC) Symptomatic Advised that smoking cessation will be beneficial Risk factor modification Follow-up with vascular   No orders of the defined types were placed in this encounter.   Return in about 6 months (around 08/21/2020) for Chronic disease management.       Charlott Rakes, MD, FAAFP. [facility] and [facility] [place], [place]   02/22/2020, 4:21 PM

## 2020-02-22 NOTE — Progress Notes (Signed)
Wants to try different medication for hot flashes.  Needs refills.  Still has little cough

## 2020-02-22 NOTE — Patient Instructions (Signed)
Menopause Menopause is the normal time of life when menstrual periods stop completely. It is usually confirmed by 12 months without a menstrual period. The transition to menopause (perimenopause) most often happens between the ages of 45 and 55. During perimenopause, hormone levels change in your body, which can cause symptoms and affect your health. Menopause may increase your risk for:  Loss of bone (osteoporosis), which causes bone breaks (fractures).  Depression.  Hardening and narrowing of the arteries (atherosclerosis), which can cause heart attacks and strokes. What are the causes? This condition is usually caused by a natural change in hormone levels that happens as you get older. The condition may also be caused by surgery to remove both ovaries (bilateral oophorectomy). What increases the risk? This condition is more likely to start at an earlier age if you have certain medical conditions or treatments, including:  A tumor of the pituitary gland in the brain.  A disease that affects the ovaries and hormone production.  Radiation treatment for cancer.  Certain cancer treatments, such as chemotherapy or hormone (anti-estrogen) therapy.  Heavy smoking and excessive alcohol use.  Family history of early menopause. This condition is also more likely to develop earlier in women who are very thin. What are the signs or symptoms? Symptoms of this condition include:  Hot flashes.  Irregular menstrual periods.  Night sweats.  Changes in feelings about sex. This could be a decrease in sex drive or an increased comfort around your sexuality.  Vaginal dryness and thinning of the vaginal walls. This may cause painful intercourse.  Dryness of the skin and development of wrinkles.  Headaches.  Problems sleeping (insomnia).  Mood swings or irritability.  Memory problems.  Weight gain.  Hair growth on the face and chest.  Bladder infections or problems with urinating. How  is this diagnosed? This condition is diagnosed based on your medical history, a physical exam, your age, your menstrual history, and your symptoms. Hormone tests may also be done. How is this treated? In some cases, no treatment is needed. You and your health care provider should make a decision together about whether treatment is necessary. Treatment will be based on your individual condition and preferences. Treatment for this condition focuses on managing symptoms. Treatment may include:  Menopausal hormone therapy (MHT).  Medicines to treat specific symptoms or complications.  Acupuncture.  Vitamin or herbal supplements. Before starting treatment, make sure to let your health care provider know if you have a personal or family history of:  Heart disease.  Breast cancer.  Blood clots.  Diabetes.  Osteoporosis. Follow these instructions at home: Lifestyle  Do not use any products that contain nicotine or tobacco, such as cigarettes and e-cigarettes. If you need help quitting, ask your health care provider.  Get at least 30 minutes of physical activity on 5 or more days each week.  Avoid alcoholic and caffeinated beverages, as well as spicy foods. This may help prevent hot flashes.  Get 7-8 hours of sleep each night.  If you have hot flashes, try: ? Dressing in layers. ? Avoiding things that may trigger hot flashes, such as spicy food, warm places, or stress. ? Taking slow, deep breaths when a hot flash starts. ? Keeping a fan in your home and office.  Find ways to manage stress, such as deep breathing, meditation, or journaling.  Consider going to group therapy with other women who are having menopause symptoms. Ask your health care provider about recommended group therapy meetings. Eating and   drinking  Eat a healthy, balanced diet that contains whole grains, lean protein, low-fat dairy, and plenty of fruits and vegetables.  Your health care provider may recommend  adding more soy to your diet. Foods that contain soy include tofu, tempeh, and soy milk.  Eat plenty of foods that contain calcium and vitamin D for bone health. Items that are rich in calcium include low-fat milk, yogurt, beans, almonds, sardines, broccoli, and kale. Medicines  Take over-the-counter and prescription medicines only as told by your health care provider.  Talk with your health care provider before starting any herbal supplements. If prescribed, take vitamins and supplements as told by your health care provider. These may include: ? Calcium. Women age 51 and older should get 1,200 mg (milligrams) of calcium every day. ? Vitamin D. Women need 600-800 International Units of vitamin D each day. ? Vitamins B12 and B6. Aim for 50 micrograms of B12 and 1.5 mg of B6 each day. General instructions  Keep track of your menstrual periods, including: ? When they occur. ? How heavy they are and how long they last. ? How much time passes between periods.  Keep track of your symptoms, noting when they start, how often you have them, and how long they last.  Use vaginal lubricants or moisturizers to help with vaginal dryness and improve comfort during sex.  Keep all follow-up visits as told by your health care provider. This is important. This includes any group therapy or counseling. Contact a health care provider if:  You are still having menstrual periods after age 55.  You have pain during sex.  You have not had a period for 12 months and you develop vaginal bleeding. Get help right away if:  You have: ? Severe depression. ? Excessive vaginal bleeding. ? Pain when you urinate. ? A fast or irregular heart beat (palpitations). ? Severe headaches. ? Abdomen (abdominal) pain or severe indigestion.  You fell and you think you have a broken bone.  You develop leg or chest pain.  You develop vision problems.  You feel a lump in your breast. Summary  Menopause is the normal  time of life when menstrual periods stop completely. It is usually confirmed by 12 months without a menstrual period.  The transition to menopause (perimenopause) most often happens between the ages of 45 and 55.  Symptoms can be managed through medicines, lifestyle changes, and complementary therapies such as acupuncture.  Eat a balanced diet that is rich in nutrients to promote bone health and heart health and to manage symptoms during menopause. This information is not intended to replace advice given to you by your health care provider. Make sure you discuss any questions you have with your health care provider. Document Revised: 03/08/2017 Document Reviewed: 04/28/2016 Elsevier Patient Education  2020 [employer]  

## 2020-02-29 LAB — LIPID PANEL
Chol/HDL Ratio: 4.3 ratio (ref 0.0–4.4)
Cholesterol, Total: 130 mg/dL (ref 100–199)
HDL: 30 mg/dL — ABNORMAL LOW (ref >39)
LDL Chol Calc (NIH): 58 mg/dL (ref 0–99)
Triglycerides: 260 mg/dL — ABNORMAL HIGH (ref 0–149)
VLDL Cholesterol Cal: 42 mg/dL — ABNORMAL HIGH (ref 5–40)

## 2020-02-29 LAB — HEPATIC FUNCTION PANEL
ALT: 5 IU/L (ref 0–32)
AST: 13 IU/L (ref 0–40)
Albumin: 4.4 g/dL (ref 3.8–4.9)
Alkaline Phosphatase: 59 IU/L (ref 44–121)
Bilirubin Total: <0.2 mg/dL (ref 0.0–1.2)
Bilirubin, Direct: <0.1 mg/dL (ref 0.00–0.40)
Total Protein: 6.8 g/dL (ref 6.0–8.5)

## 2020-03-07 MED FILL — CARVEDILOL 3.125 MG TABLET: 3.125 | 31 days supply | Qty: 62 | Fill #0

## 2020-03-07 MED FILL — GABAPENTIN 300 MG CAPSULE: 300 | 30 days supply | Qty: 120 | Fill #0

## 2020-03-07 MED FILL — LISINOPRIL 5 MG TABLET: 5 | 30 days supply | Qty: 30 | Fill #0

## 2020-03-07 MED FILL — ROSUVASTATIN CALCIUM 40 MG: 40 | 30 days supply | Qty: 30 | Fill #0

## 2020-03-08 ENCOUNTER — Ambulatory Visit: Payer: 59 | Admitting: Cardiovascular Disease

## 2020-03-08 ENCOUNTER — Encounter: Payer: Self-pay | Admitting: Cardiovascular Disease

## 2020-03-08 ENCOUNTER — Other Ambulatory Visit: Payer: Self-pay

## 2020-03-08 VITALS — BP 118/80 | HR 97 | Ht 64.0 in | Wt 165.4 lb

## 2020-03-08 DIAGNOSIS — Z720 Tobacco use: Secondary | ICD-10-CM | POA: Diagnosis not present

## 2020-03-08 DIAGNOSIS — I739 Peripheral vascular disease, unspecified: Secondary | ICD-10-CM

## 2020-03-08 DIAGNOSIS — E785 Hyperlipidemia, unspecified: Secondary | ICD-10-CM

## 2020-03-08 DIAGNOSIS — I2510 Atherosclerotic heart disease of native coronary artery without angina pectoris: Secondary | ICD-10-CM

## 2020-03-08 NOTE — Patient Instructions (Signed)
Medication Instructions:  No changes     Lab Work:  Not needed   Testing/Procedures: Not needed   Follow-Up: At [facility], you and your health needs are our priority.  As part of our continuing mission to provide you with exceptional heart care, we have created designated Provider Care Teams.  These Care Teams include your primary Cardiologist (physician) and Advanced Practice Providers (APPs -  Physician Assistants and Nurse Practitioners) who all work together to provide you with the care you need, when you need it.     Your next appointment:   6 month(s)  The format for your next appointment:   In Person  Provider:   [name], MD

## 2020-03-08 NOTE — Progress Notes (Signed)
Cardiology Office Note   Date:  03/08/2020   ID:  Robin Walsh, DOB 1965/10/17, MRN [id number]  PCP:  Charlott Rakes, MD  Cardiologist:  Dr. Marlou Porch  No chief complaint on file.     History of Present Illness: Robin Walsh is a 54 y.o. female who presents for a follow-up visit regarding  peripheral arterial disease. She has known history of coronary artery disease, hypertension, hyperlipidemia and tobacco use. She is status post LAD PCI in 2017. She is status post bilateral common iliac artery kissing stent placement in 12/2015 for severe claudication. She had no significant infrainguinal disease. She had recurrent claudication last year due to severe in-stent restenosis.  Preprocedure labs revealed severe anemia with a hemoglobin of 7.5 and thus the procedure was postponed.  She was hospitalized and transfused.  She was then seen by GI and had EGD and colonoscopy.  She had few polyps removed and she also reports having an anal fissure that was causing the bleeding.  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  I performed successful drug-coated balloon angioplasty to bilateral common iliac arteries.  Most recent noninvasive evaluation this month showed normal ABI bilaterally.  Duplex showed patent iliac stents with moderately elevated velocities.   She has been doing reasonably well with stable mild bilateral leg claudication.  Unfortunately, she continues to smoke half a pack per day.  LDL improved significantly after switching to high-dose rosuvastatin.  She reports occasional episodes of chest discomfort in the upper epigastric area.  It feels like aching that is localized and relieved by pushing down.  It is not exertional and very different from prior angina.   Past Medical History:  Diagnosis Date  . Anal fissure   . Anemia   . CAD (coronary artery disease)   . Hyperlipidemia   . Hypertension      Past Surgical History:  Procedure Laterality Date  . ABDOMINAL AORTOGRAM W/LOWER EXTREMITY N/A 01/14/2019   Procedure: ABDOMINAL AORTOGRAM W/LOWER EXTREMITY;  Surgeon: Wellington Hampshire, MD;  Location: [place] [facility];  Service: Cardiovascular;  Laterality: N/A;  limited  . CARDIAC CATHETERIZATION N/A 11/28/2015   Procedure: Left Heart Cath and Coronary Angiography;  Surgeon: Jettie Booze, MD;  Location: [place] [facility];  Service: Cardiovascular;  Laterality: N/A;  . CARDIAC CATHETERIZATION N/A 11/28/2015   Procedure: Coronary Stent Intervention;  Surgeon: Jettie Booze, MD;  Location: [place] [facility];  Service: Cardiovascular;  Laterality: N/A;  DES to Mid LAD; 3.0x12 Synergy  . LEG SURGERY Left 2002?   tibial plateau fracture  . PERIPHERAL VASCULAR BALLOON ANGIOPLASTY Bilateral 01/14/2019   Procedure: PERIPHERAL VASCULAR BALLOON ANGIOPLASTY;  Surgeon: Wellington Hampshire, MD;  Location: [place] [facility];  Service: Cardiovascular;  Laterality: Bilateral;  common iliac  . PERIPHERAL VASCULAR CATHETERIZATION N/A 01/04/2016   Procedure: Abdominal Aortogram;  Surgeon: Nelva Bush, MD;  Location: [place] [facility];  Service: Cardiovascular;  Laterality: N/A;  . PERIPHERAL VASCULAR CATHETERIZATION Bilateral 01/04/2016   Procedure: Lower Extremity Angiography;  Surgeon: Nelva Bush, MD;  Location: [place] [facility];  Service: Cardiovascular;  Laterality: Bilateral;  . PERIPHERAL VASCULAR CATHETERIZATION Bilateral 01/04/2016   Procedure: Peripheral Vascular Intervention;  Surgeon: Nelva Bush, MD;  Location: [place] [facility];  Service: Cardiovascular;  Laterality: Bilateral;  common iliac     Current Outpatient Medications  Medication Sig Dispense Refill  . acetaminophen (TYLENOL) 500 MG tablet  Take 500 mg by mouth every 6 (six) hours as needed for headache (for headaches).     Marland Kitchen albuterol (VENTOLIN HFA) 108 (90 Base) MCG/ACT inhaler Inhale 1-2 puffs into the  lungs every 6 (six) hours as needed for wheezing or shortness of breath. 18 g 0  . aspirin 81 MG chewable tablet Chew 1 tablet (81 mg total) by mouth daily. (Patient taking differently: Chew 81 mg by mouth every evening. ) 30 tablet 11  . benzonatate (TESSALON) 100 MG capsule Take 1 capsule (100 mg total) by mouth 3 (three) times daily as needed for cough. 30 capsule 0  . carvedilol (COREG) 3.125 MG tablet Take 1 tablet (3.125 mg total) by mouth 2 (two) times daily with a meal. 180 tablet 1  . gabapentin (NEURONTIN) 300 MG capsule TAKE 2 CAPSULES BY MOUTH 2 TIMES DAILY. 120 capsule 6  . HYDROcodone-Chlorpheniramine 5-4 MG/5ML SOLN Take 5 mLs by mouth at bedtime as needed (cough). 75 mL 0  . lisinopril (ZESTRIL) 5 MG tablet Take 1 tablet (5 mg total) by mouth daily. 90 tablet 1  . meloxicam (MOBIC) 7.5 MG tablet Take 1 tablet (7.5 mg total) by mouth daily. 30 tablet 1  . nitroGLYCERIN (NITROSTAT) 0.4 MG SL tablet Place 1 tablet (0.4 mg total) under the tongue every 5 (five) minutes as needed for chest pain. X 3 doses 25 tablet 1  . rosuvastatin (CRESTOR) 40 MG tablet Take 1 tablet (40 mg total) by mouth daily. 90 tablet 1  . tiZANidine (ZANAFLEX) 4 MG tablet Take 1 tablet (4 mg total) by mouth every 8 (eight) hours as needed for muscle spasms. 60 tablet 1  . venlafaxine XR (EFFEXOR XR) 75 MG 24 hr capsule Take 1 capsule (75 mg total) by mouth daily with breakfast. 30 capsule 6  . vitamin B-12 (CYANOCOBALAMIN) 500 MCG tablet Take 500 mcg by mouth daily.    . ferrous sulfate 325 (65 FE) MG EC tablet Take 1 tablet (325 mg total) by mouth 2 (two) times daily. 60 tablet 3   No current facility-administered medications for this visit.    Allergies:   Chantix [varenicline tartrate], Codeine, and Dilaudid [hydromorphone hcl]    Social History:  The patient  reports that she has been smoking cigarettes. She has a 12.50 pack-year smoking history. She has never used smokeless tobacco. She reports current  drug use. Drug: Marijuana. She reports that she does not drink alcohol.   Family History:  The patient's family history includes Alcohol abuse (age of onset: 15) in her father; Congestive Heart Failure (age of onset: 51) in her mother; Heart disease in her brother; Liver disease in her sister.    ROS:  Please see the history of present illness.   Otherwise, review of systems are positive for none.   All other systems are reviewed and negative.    PHYSICAL EXAM: VS:  BP 118/80   Pulse 97   Ht 5\' 4"  (1.626 m)   Wt 165 lb 6.4 oz (75 kg)   LMP 03/26/2017   BMI 28.39 kg/m  , BMI Body mass index is 28.39 kg/m. GEN: Well nourished, well developed, in no acute distress  HEENT: normal  Neck: no JVD, carotid bruits, or masses Cardiac: RRR; no murmurs, rubs, or gallops,no edema  Respiratory:  clear to auscultation bilaterally, normal work of breathing GI: soft, nontender, nondistended, + BS MS: no deformity or atrophy  Skin: warm and dry, no rash Neuro:  Strength and sensation are intact Psych:  euthymic mood, full affect Vascular: Femoral pulses: +1 bilaterally.   EKG:  EKG is not ordered today.    Recent Labs: 05/18/2019: BUN 12; Creatinine, Ser 1.03; Potassium 5.4; Sodium 144 02/29/2020: ALT 5    Lipid Panel    Component Value Date/Time   CHOL 130 02/29/2020 0851   TRIG 260 (H) 02/29/2020 0851   HDL 30 (L) 02/29/2020 0851   CHOLHDL 4.3 02/29/2020 0851   CHOLHDL 7.8 11/27/2015 0453   VLDL 66 (H) 11/27/2015 0453   LDLCALC 58 02/29/2020 0851      Wt Readings from Last 3 Encounters:  03/08/20 165 lb 6.4 oz (75 kg)  02/22/20 168 lb (76.2 kg)  09/08/19 169 lb (76.7 kg)       No flowsheet data found.    ASSESSMENT AND PLAN:  1.  Peripheral arterial disease: Status post drug-coated balloon angioplasty for severe bilateral common iliac artery in-stent restenosis.  She has minimal claudication. Unfortunately, she is at high risk for restenosis given continued smoking.   Repeat Doppler studies in May of next year.  2. Tobacco use: I again discussed with her the importance of smoking cessation  3. Coronary artery disease involving native coronary arteries with other forms of angina:   Continue medical therapy.  Recent episodes of atypical chest pain that is very different from her prior angina and does not seem to be cardiac.  Most recent EKG in June was normal.  4. Hyperlipidemia: Significant improvement in lipid profile after switching to high-dose rosuvastatin.  LDL decreased to 58.  Triglycerides also improved but still mildly elevated at 260 and we discussed with her the importance of low-carb diet.  Vascepa can be considered in the future if triglyceride remains above 150.   Disposition:   FU with me in 6 months  Signed,  [name], MD  03/08/2020 8:52 AM    [place]

## 2020-05-16 MED FILL — LISINOPRIL 5 MG TABLET: 5 | 30 days supply | Qty: 30 | Fill #1

## 2020-05-16 MED FILL — GABAPENTIN 300 MG CAPSULE: 300 | 30 days supply | Qty: 120 | Fill #1

## 2020-05-16 MED FILL — CARVEDILOL 3.125 MG TABLET: 3.125 | 31 days supply | Qty: 62 | Fill #1

## 2020-05-16 MED FILL — ROSUVASTATIN CALCIUM 40 MG: 40 | 30 days supply | Qty: 30 | Fill #1

## 2020-07-11 IMAGING — MG DIGITAL SCREENING BILAT W/ TOMO W/ CAD
6 series · 6 of 18 positions shown · non-contrast
Comparison: Previous exam(s).

CLINICAL DATA: Screening.

EXAM:
DIGITAL SCREENING BILATERAL MAMMOGRAM WITH TOMO AND CAD

[R MLO synth-2D]
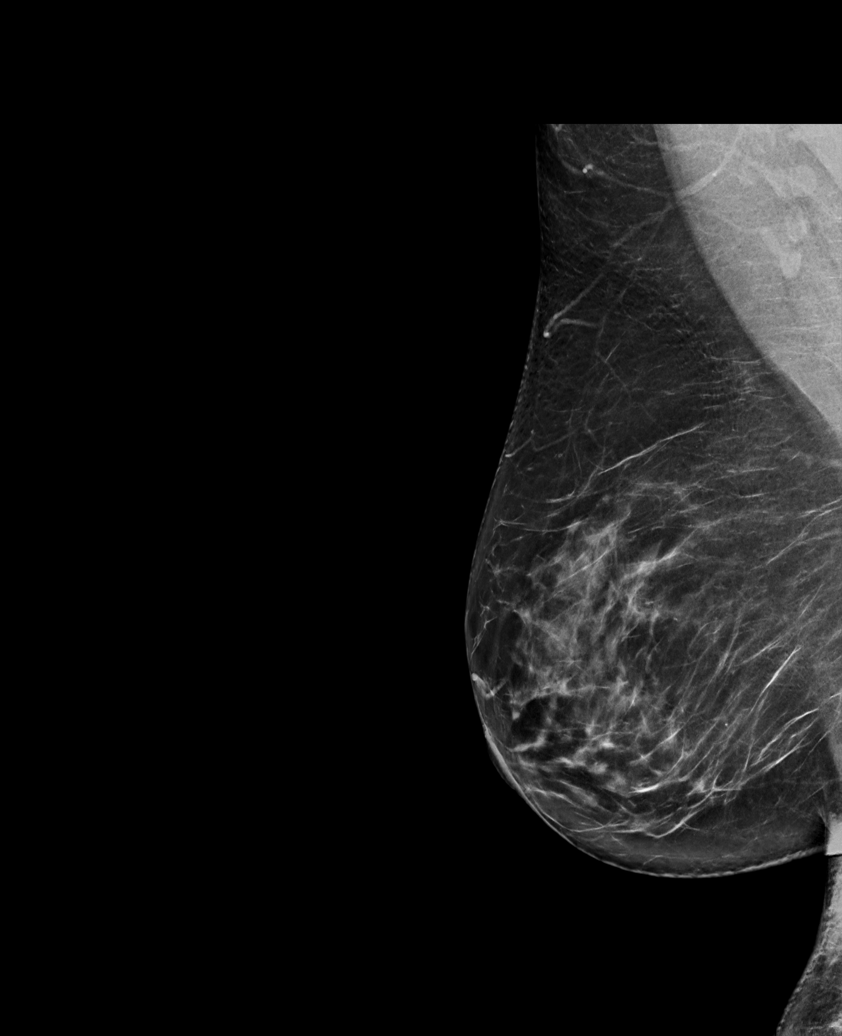

[R CC synth-2D]
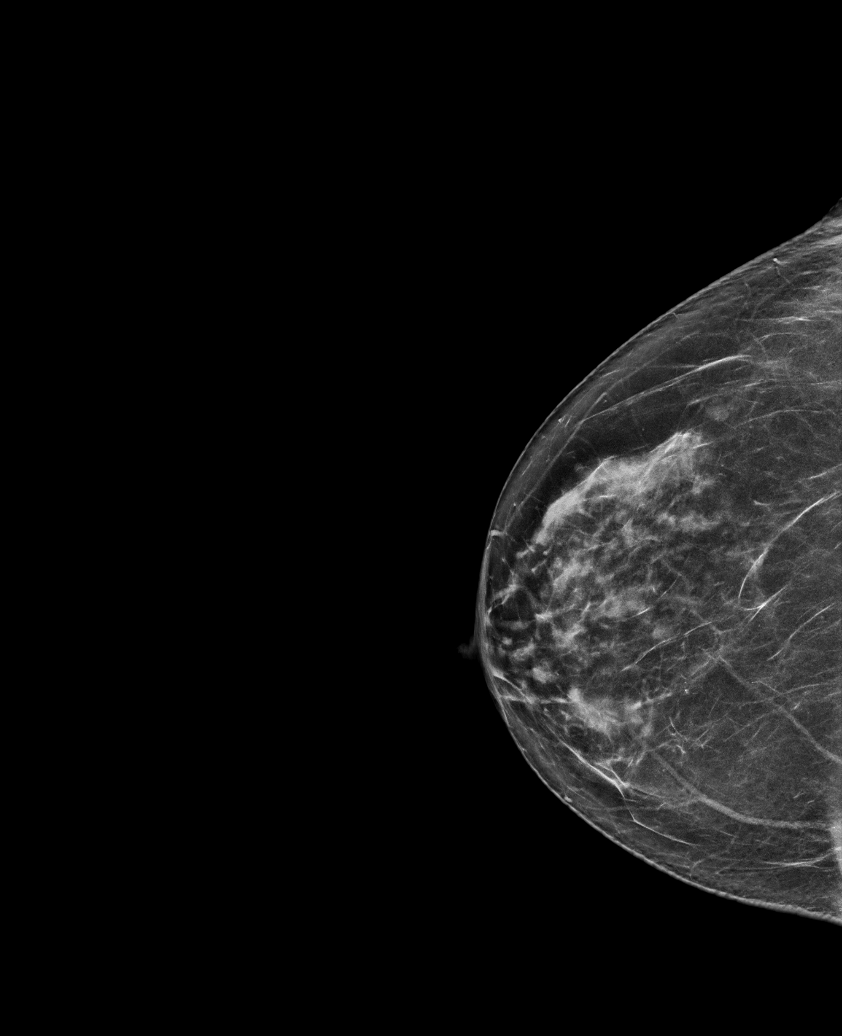

[L CC synth-2D]
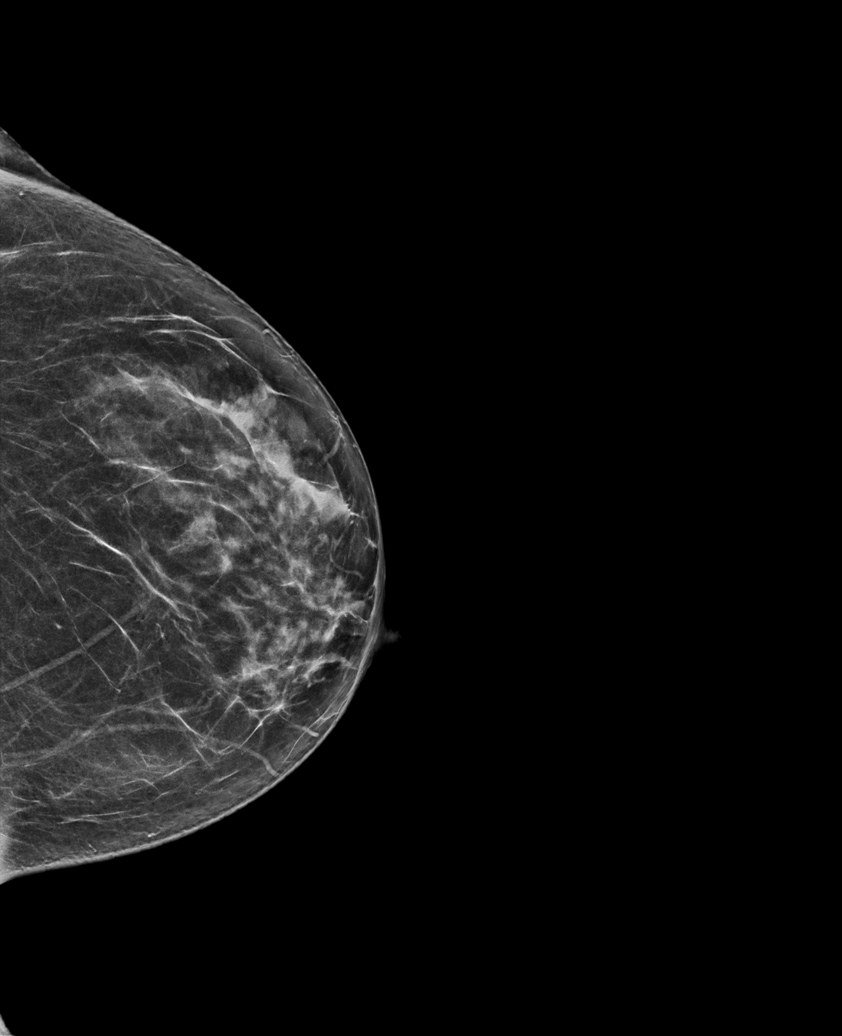

[R CC tomo · tomo slice 35/70.0]
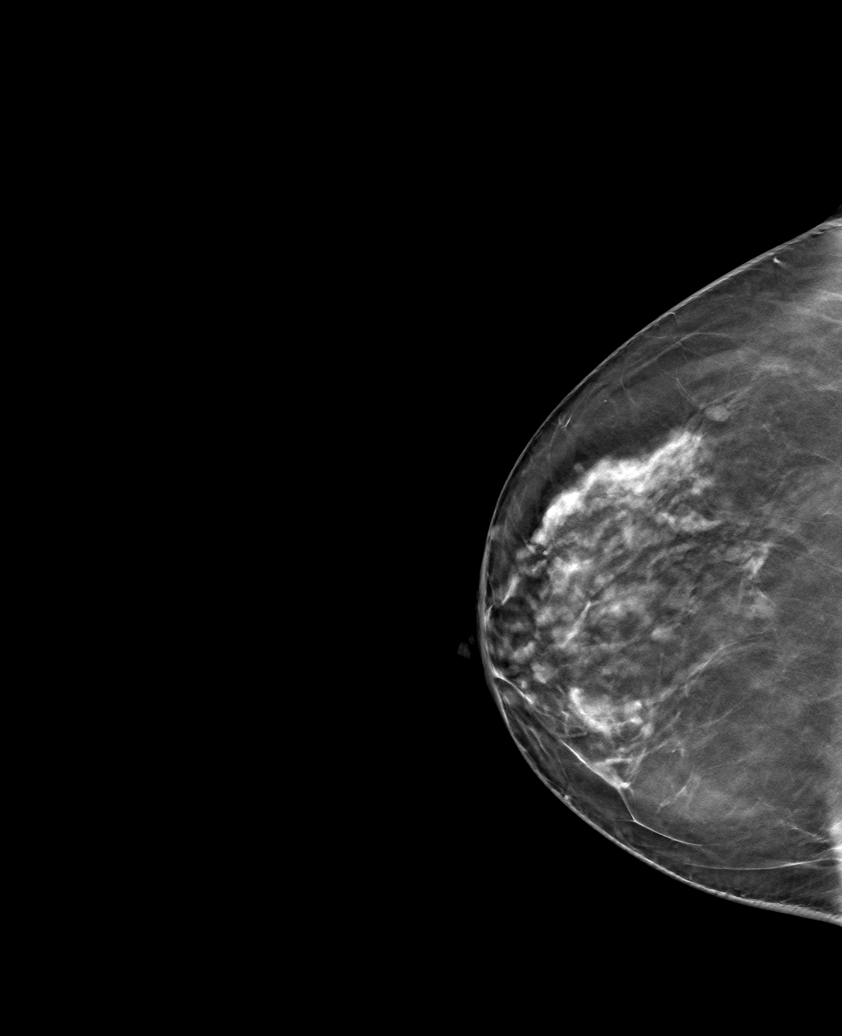

[R MLO tomo · tomo slice 43/86.0]
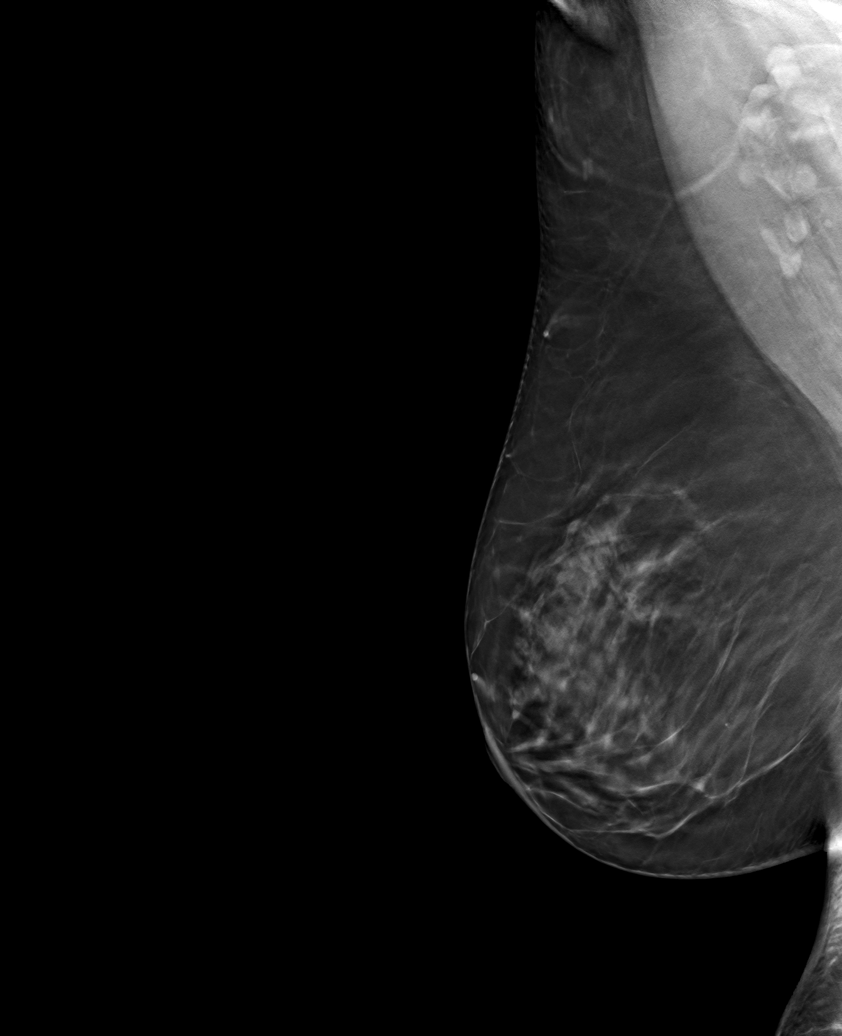

[L CC tomo · tomo slice 33/66.0]
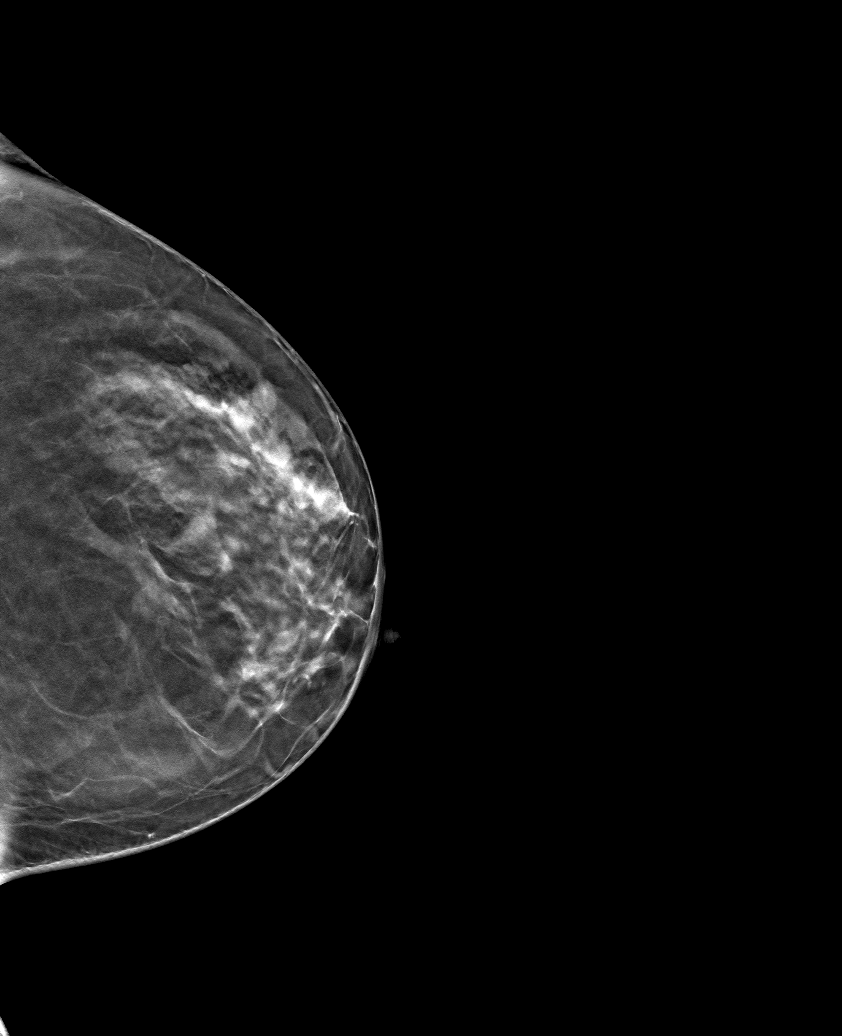

[6 of 18 positions shown; findings below may reference images not displayed]

ACR Breast Density Category c: The breast tissue is heterogeneously
dense, which may obscure small masses.
FINDINGS: There are no findings suspicious for malignancy. Images were
processed with CAD.
IMPRESSION: No mammographic evidence of malignancy. A result letter of this
screening mammogram will be mailed directly to the patient.

RECOMMENDATION:
Screening mammogram in one year. (Code:[id number]-U-LHB)

BI-RADS CATEGORY  1: Negative.

## 2020-07-15 ENCOUNTER — Other Ambulatory Visit: Payer: Self-pay

## 2020-07-15 MED FILL — Lisinopril Tab 5 MG: ORAL | 30 days supply | Qty: 30 | Fill #0 | Status: AC

## 2020-07-15 MED FILL — Rosuvastatin Calcium Tab 40 MG: ORAL | 30 days supply | Qty: 30 | Fill #0 | Status: AC

## 2020-07-15 MED FILL — Carvedilol Tab 3.125 MG: ORAL | 30 days supply | Qty: 60 | Fill #0 | Status: AC

## 2020-07-15 MED FILL — Gabapentin Cap 300 MG: ORAL | 30 days supply | Qty: 120 | Fill #0 | Status: AC

## 2020-07-18 ENCOUNTER — Other Ambulatory Visit: Payer: Self-pay

## 2020-08-10 ENCOUNTER — Other Ambulatory Visit: Payer: Self-pay

## 2020-08-10 MED FILL — Carvedilol Tab 3.125 MG: ORAL | 30 days supply | Qty: 60 | Fill #1 | Status: AC

## 2020-08-10 MED FILL — Rosuvastatin Calcium Tab 40 MG: ORAL | 30 days supply | Qty: 30 | Fill #1 | Status: AC

## 2020-08-10 MED FILL — Lisinopril Tab 5 MG: ORAL | 30 days supply | Qty: 30 | Fill #1 | Status: AC

## 2020-08-11 ENCOUNTER — Other Ambulatory Visit: Payer: Self-pay

## 2020-08-11 ENCOUNTER — Ambulatory Visit (HOSPITAL_COMMUNITY)
Admission: RE | Admit: 2020-08-11 | Discharge: 2020-08-11 | Disposition: A | Discharge status: Home / Self Care | Payer: 59 | Source: Ambulatory Visit | Attending: Cardiology | Admitting: Cardiology

## 2020-08-11 ENCOUNTER — Ambulatory Visit (HOSPITAL_BASED_OUTPATIENT_CLINIC_OR_DEPARTMENT_OTHER)
Admission: RE | Admit: 2020-08-11 | Discharge: 2020-08-11 | Disposition: A | Discharge status: Home / Self Care | Payer: 59 | Source: Ambulatory Visit | Attending: Cardiovascular Disease | Admitting: Cardiovascular Disease

## 2020-08-11 DIAGNOSIS — Z95828 Presence of other vascular implants and grafts: Secondary | ICD-10-CM | POA: Insufficient documentation

## 2020-08-11 DIAGNOSIS — I739 Peripheral vascular disease, unspecified: Secondary | ICD-10-CM | POA: Insufficient documentation

## 2020-09-02 ENCOUNTER — Other Ambulatory Visit: Payer: Self-pay

## 2020-09-02 MED FILL — Gabapentin Cap 300 MG: ORAL | 30 days supply | Qty: 120 | Fill #1 | Status: AC

## 2020-09-05 NOTE — Progress Notes (Signed)
Cardiology Office Note   Date:  09/06/2020   ID:  Robin Walsh, DOB 09-22-1965, MRN [id number]  PCP:  Robin Rakes, MD  Cardiologist:  Dr. Marlou Porch  No chief complaint on file.     History of Present Illness: Robin Walsh is a 55 y.o. female who presents for a follow-up visit regarding  peripheral arterial disease. She has known history of coronary artery disease, hypertension, hyperlipidemia and tobacco use. She is status post LAD PCI in 2017. She is status post bilateral common iliac artery kissing stent placement in 12/2015 for severe claudication. She had no significant infrainguinal disease. She had recurrent claudication in 2020 due to severe in-stent restenosis.  Preprocedure labs revealed severe anemia with a hemoglobin of 7.5 and thus the procedure was postponed.  She was hospitalized and transfused.  She was then seen by GI and had EGD and colonoscopy.  She had few polyps removed and she also reports having an anal fissure that was causing the bleeding.  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  I performed successful drug-coated balloon angioplasty to bilateral common iliac arteries.    She reports stable bilateral leg claudication worse on the right side.  This happened after walking about 1 block.  No rest pain. Most recent vascular studies earlier this month showed a slight decrease in ABI compared to before.  It was 0.83 on the right and 0.93 on the left.  Duplex showed evidence of significant in-stent restenosis with peak velocity of 438 on the right and 367 on the left.  She denies chest pain or shortness of breath.  She cut down on tobacco use but has not been able to quit.  She smokes 1 pack every week.  Past Medical History:  Diagnosis Date  . Anal fissure   . Anemia   . CAD (coronary artery disease)   . Hyperlipidemia   . Hypertension     Past Surgical History:   Procedure Laterality Date  . ABDOMINAL AORTOGRAM W/LOWER EXTREMITY N/A 01/14/2019   Procedure: ABDOMINAL AORTOGRAM W/LOWER EXTREMITY;  Surgeon: Wellington Hampshire, MD;  Location: [place] [facility];  Service: Cardiovascular;  Laterality: N/A;  limited  . CARDIAC CATHETERIZATION N/A 11/28/2015   Procedure: Left Heart Cath and Coronary Angiography;  Surgeon: Jettie Booze, MD;  Location: [place] [facility];  Service: Cardiovascular;  Laterality: N/A;  . CARDIAC CATHETERIZATION N/A 11/28/2015   Procedure: Coronary Stent Intervention;  Surgeon: Jettie Booze, MD;  Location: [place] [facility];  Service: Cardiovascular;  Laterality: N/A;  DES to Mid LAD; 3.0x12 Synergy  . LEG SURGERY Left 2002?   tibial plateau fracture  . PERIPHERAL VASCULAR BALLOON ANGIOPLASTY Bilateral 01/14/2019   Procedure: PERIPHERAL VASCULAR BALLOON ANGIOPLASTY;  Surgeon: Wellington Hampshire, MD;  Location: [place] [facility];  Service: Cardiovascular;  Laterality: Bilateral;  common iliac  . PERIPHERAL VASCULAR CATHETERIZATION N/A 01/04/2016   Procedure: Abdominal Aortogram;  Surgeon: Nelva Bush, MD;  Location: [place] [facility];  Service: Cardiovascular;  Laterality: N/A;  . PERIPHERAL VASCULAR CATHETERIZATION Bilateral 01/04/2016   Procedure: Lower Extremity Angiography;  Surgeon: Nelva Bush, MD;  Location: [place] [facility];  Service: Cardiovascular;  Laterality: Bilateral;  . PERIPHERAL VASCULAR CATHETERIZATION Bilateral 01/04/2016   Procedure: Peripheral Vascular Intervention;  Surgeon: Nelva Bush, MD;  Location: [place] [facility];  Service: Cardiovascular;  Laterality: Bilateral;  common iliac     Current Outpatient Medications  Medication Sig Dispense Refill  . acetaminophen (TYLENOL) 500 MG tablet Take 500 mg by mouth every 6 (six) hours as needed for headache (for headaches).     Marland Kitchen albuterol (VENTOLIN HFA) 108 (90 Base) MCG/ACT inhaler Inhale 1-2 puffs into the lungs every 6 (six) hours as  needed for wheezing or shortness of breath. 18 g 0  . aspirin 81 MG chewable tablet Chew 1 tablet (81 mg total) by mouth daily. (Patient taking differently: Chew 81 mg by mouth every evening.) 30 tablet 11  . benzonatate (TESSALON) 100 MG capsule TAKE 1 CAPSULE (100 MG TOTAL) BY MOUTH 3 (THREE) TIMES DAILY AS NEEDED FOR COUGH. 30 capsule 0  . carvedilol (COREG) 3.125 MG tablet TAKE 1 TABLET (3.125 MG TOTAL) BY MOUTH 2 (TWO) TIMES DAILY WITH A MEAL. 180 tablet 1  . ferrous sulfate 325 (65 FE) MG EC tablet Take 1 tablet (325 mg total) by mouth 2 (two) times daily. 60 tablet 3  . gabapentin (NEURONTIN) 300 MG capsule TAKE 2 CAPSULES BY MOUTH 2 TIMES DAILY. 120 capsule 6  . lisinopril (ZESTRIL) 5 MG tablet TAKE 1 TABLET (5 MG TOTAL) BY MOUTH DAILY. 90 tablet 1  . meloxicam (MOBIC) 7.5 MG tablet Take 1 tablet (7.5 mg total) by mouth daily. 30 tablet 1  . nitroGLYCERIN (NITROSTAT) 0.4 MG SL tablet Place 1 tablet (0.4 mg total) under the tongue every 5 (five) minutes as needed for chest pain. X 3 doses 25 tablet 1  . rosuvastatin (CRESTOR) 40 MG tablet TAKE 1 TABLET (40 MG TOTAL) BY MOUTH DAILY. 90 tablet 1  . tiZANidine (ZANAFLEX) 4 MG tablet Take 1 tablet (4 mg total) by mouth every 8 (eight) hours as needed for muscle spasms. 60 tablet 1  . vitamin B-12 (CYANOCOBALAMIN) 500 MCG tablet Take 500 mcg by mouth daily.     No current facility-administered medications for this visit.    Allergies:   Chantix [varenicline tartrate], Codeine, and Dilaudid [hydromorphone hcl]    Social History:  The patient  reports that she has been smoking cigarettes. She has a 12.50 pack-year smoking history. She has never used smokeless tobacco. She reports current drug use. Drug: Marijuana. She reports that she does not drink alcohol.   Family History:  The patient's family history includes Alcohol abuse (age of onset: 63) in her father; Congestive Heart Failure (age of onset: 21) in her mother; Heart disease in her  brother; Liver disease in her sister.    ROS:  Please see the history of present illness.   Otherwise, review of systems are positive for none.   All other systems are reviewed and negative.    PHYSICAL EXAM: VS:  BP (!) 150/80 (BP Location: Right Arm, Patient Position: Sitting, Cuff Size: Normal)   Pulse 79   Ht 5\' 4"  (1.626 m)   Wt 171 lb 9.6 oz (77.8 kg)   LMP 03/26/2017   BMI 29.46 kg/m  , BMI Body mass index is 29.46 kg/m. GEN: Well nourished, well developed, in no acute distress  HEENT: normal  Neck: no JVD, carotid bruits, or masses Cardiac: RRR; no murmurs, rubs, or gallops,no edema  Respiratory:  clear to auscultation bilaterally, normal work of breathing GI: soft, nontender, nondistended, + BS MS: no deformity or atrophy  Skin: warm and dry, no rash Neuro:  Strength and sensation are intact Psych: euthymic mood, full affect Vascular: Femoral pulses: +1 bilaterally.  Distal pulses are palpable on the left side but not the right.  EKG:  EKG is ordered today. EKG showed normal sinus rhythm with no significant ST or T wave changes.   Recent Labs: 02/29/2020: ALT 5    Lipid Panel    Component Value Date/Time   CHOL 130 02/29/2020 0851   TRIG 260 (H) 02/29/2020 0851   HDL 30 (L) 02/29/2020 0851   CHOLHDL 4.3 02/29/2020 0851   CHOLHDL 7.8 11/27/2015 0453   VLDL 66 (H) 11/27/2015 0453   LDLCALC 58 02/29/2020 0851      Wt Readings from Last 3 Encounters:  09/06/20 171 lb 9.6 oz (77.8 kg)  03/08/20 165 lb 6.4 oz (75 kg)  02/22/20 168 lb (76.2 kg)       No flowsheet data found.    ASSESSMENT AND PLAN:  1.  Peripheral arterial disease: Status post drug-coated balloon angioplasty for severe bilateral common iliac artery in-stent restenosis.  Recurrent moderate to severe claudication at the present time after walking 1 block.  This is due to in-stent restenosis mostly on the right side.  Nonetheless, she does not feel that her symptoms are lifestyle limiting.   Continue medical therapy for now.    2. Tobacco use: I again discussed with her the importance of smoking cessation  3. Coronary artery disease involving native coronary arteries with other forms of angina:   Continue medical therapy.  No anginal symptoms at the present time.  4. Hyperlipidemia: Significant improvement in lipid profile with high-dose rosuvastatin.  Most recent lipid profile showed an LDL of 58.  Triglyceride was mildly elevated at 260 and if this persists, treatment with Vascepa can be considered.  I provided her with a temporary 6 months handicap form given her claudication.    Disposition:   FU with me in 6 months  Signed,  Robin Sacramento, MD  09/06/2020 8:53 AM    [place]

## 2020-09-06 ENCOUNTER — Other Ambulatory Visit: Payer: Self-pay

## 2020-09-06 ENCOUNTER — Ambulatory Visit: Payer: 59 | Admitting: Cardiovascular Disease

## 2020-09-06 ENCOUNTER — Encounter: Payer: Self-pay | Admitting: Cardiovascular Disease

## 2020-09-06 VITALS — BP 150/80 | HR 79 | Ht 64.0 in | Wt 171.6 lb

## 2020-09-06 DIAGNOSIS — I25118 Atherosclerotic heart disease of native coronary artery with other forms of angina pectoris: Secondary | ICD-10-CM

## 2020-09-06 DIAGNOSIS — Z720 Tobacco use: Secondary | ICD-10-CM

## 2020-09-06 DIAGNOSIS — I739 Peripheral vascular disease, unspecified: Secondary | ICD-10-CM

## 2020-09-06 DIAGNOSIS — E785 Hyperlipidemia, unspecified: Secondary | ICD-10-CM | POA: Diagnosis not present

## 2020-09-06 NOTE — Patient Instructions (Signed)

## 2020-09-27 ENCOUNTER — Encounter: Payer: Self-pay | Admitting: Emergency Medicine

## 2020-09-27 ENCOUNTER — Telehealth: Payer: 59 | Admitting: Emergency Medicine

## 2020-09-27 DIAGNOSIS — U071 COVID-19: Secondary | ICD-10-CM

## 2020-09-27 MED ORDER — MOLNUPIRAVIR EUA 200MG CAPSULE: 4.0000 | ORAL_CAPSULE | Freq: Two times a day (BID) | ORAL | 0 refills | Status: AC

## 2020-09-27 MED ORDER — BENZONATATE 100 MG PO CAPS
100.0000 mg | ORAL_CAPSULE | Freq: Three times a day (TID) | ORAL | 0 refills | Status: DC | PRN
Start: 2020-09-27 — End: 2021-04-26

## 2020-09-27 NOTE — Patient Instructions (Signed)
Please follow up with primary care later this week if not improving or do not hesitate to seek in-person evaluation with urgent care or emergency department if you develop worsening symptoms including: chest pain, trouble breathing, unable to keep down fluids, or other new concerning symptoms develop.

## 2020-09-27 NOTE — Progress Notes (Signed)
[name], Walsh are scheduled for a virtual visit with your provider today.    Just as we do with appointments in the office, we must obtain your consent to participate.  Your consent will be active for this visit and any virtual visit you may have with one of our providers in the next 365 days.    If you have a MyChart account, I can also send a copy of this consent to you electronically.  All virtual visits are billed to your insurance company just like a traditional visit in the office.  As this is a virtual visit, video technology does not allow for your provider to perform a traditional examination.  This may limit your provider's ability to fully assess your condition.  If your provider identifies any concerns that need to be evaluated in person or the need to arrange testing such as labs, EKG, etc, we will make arrangements to do so.    Although advances in technology are sophisticated, we cannot ensure that it will always work on either your end or our end.  If the connection with a video visit is poor, we may have to switch to a telephone visit.  With either a video or telephone visit, we are not always able to ensure that we have a secure connection.   I need to obtain your verbal consent now.   Are you willing to proceed with your visit today?   Robin Walsh has provided verbal consent on 09/27/2020 for a virtual visit (video or telephone).   Robin Gens, PA-C 09/27/2020  11:52 AM   Date:  09/27/2020   ID:  Robin Walsh, DOB 15-Jun-1965, MRN [id number]  Patient Location: Home Provider Location: Home Office   Participants: Patient and Provider for Visit and Wrap up  Method of visit: Video  Location of Patient: Home Location of Provider: Home Office Consent was obtain for visit over the video. Services rendered by provider: Visit was performed via video  A video enabled telemedicine application was used and I verified that I am speaking with the correct person using two  identifiers.  PCP:  Charlott Rakes, MD   Chief Complaint:  COVID positive, worsening cough, congestion   History of Present Illness:    Robin Walsh is a 55 y.o. female with history as stated below. Presents video telehealth for an acute care visit  Onset of symptoms was Saturday 09/24/20 and symptoms have been persistent and include: mildly productive cough, congestion, mild sore throat and bilateral ear pain.  Sudafed helped with ear and sinus pain.   She has also take tylenol and ibuprofen as needed. Housemate tested positive for COVID 9 days ago, had flu-like symptoms.    Denies having fevers, chills, shortness of breath, chest pain. No n/v/d.    No other aggravating or relieving factors.  No other c/o.   The patient does have symptoms concerning for COVID-19 infection (fever, chills, cough, or new shortness of breath).  Patient has been tested for COVID during this illness.  Past Medical, Surgical, Social History, Allergies, and Medications have been Reviewed.  Patient Active Problem List   Diagnosis Date Noted   Symptomatic anemia 10/23/2018   Hemorrhoids 10/23/2018   Leukocytosis 10/23/2018   Renal insufficiency 10/23/2018   Tobacco abuse 10/23/2018   Vitamin D deficiency 02/11/2018   Hot flash, menopausal 02/06/2017   PAD (peripheral artery disease) ([place]) 01/04/2016   Medication management 12/09/2015   Chest pain    Abnormal finding on  EKG - anterior T-wave inversions concerning for LAD ischemia 11/28/2015   CAD S/P DES PCI TO mLAD - Synergy DES 3.0 mm x 12 mm 11/28/2015   Unstable angina ([place]) 11/27/2015   Essential hypertension 11/27/2015   Dyslipidemia, goal LDL below 70 11/27/2015   Tobacco dependence 11/27/2015   Noncompliance 11/27/2015   Marijuana dependence ([place]) 11/27/2015   Right hip pain 11/27/2015    Social History   Tobacco Use   Smoking status: Every Day    Packs/day: 0.50    Years: 25.00    Pack years: 12.50    Types: Cigarettes    Smokeless tobacco: Never  Substance Use Topics   Alcohol use: No     Current Outpatient Medications:    acetaminophen (TYLENOL) 500 MG tablet, Take 500 mg by mouth every 6 (six) hours as needed for headache (for headaches). , Disp: , Rfl:    albuterol (VENTOLIN HFA) 108 (90 Base) MCG/ACT inhaler, Inhale 1-2 puffs into the lungs every 6 (six) hours as needed for wheezing or shortness of breath., Disp: 18 g, Rfl: 0   aspirin 81 MG chewable tablet, Chew 1 tablet (81 mg total) by mouth daily. (Patient taking differently: Chew 81 mg by mouth every evening.), Disp: 30 tablet, Rfl: 11   benzonatate (TESSALON) 100 MG capsule, TAKE 1 CAPSULE (100 MG TOTAL) BY MOUTH 3 (THREE) TIMES DAILY AS NEEDED FOR COUGH., Disp: 30 capsule, Rfl: 0   carvedilol (COREG) 3.125 MG tablet, TAKE 1 TABLET (3.125 MG TOTAL) BY MOUTH 2 (TWO) TIMES DAILY WITH A MEAL., Disp: 180 tablet, Rfl: 1   ferrous sulfate 325 (65 FE) MG EC tablet, Take 1 tablet (325 mg total) by mouth 2 (two) times daily., Disp: 60 tablet, Rfl: 3   gabapentin (NEURONTIN) 300 MG capsule, TAKE 2 CAPSULES BY MOUTH 2 TIMES DAILY., Disp: 120 capsule, Rfl: 6   lisinopril (ZESTRIL) 5 MG tablet, TAKE 1 TABLET (5 MG TOTAL) BY MOUTH DAILY., Disp: 90 tablet, Rfl: 1   meloxicam (MOBIC) 7.5 MG tablet, Take 1 tablet (7.5 mg total) by mouth daily., Disp: 30 tablet, Rfl: 1   nitroGLYCERIN (NITROSTAT) 0.4 MG SL tablet, Place 1 tablet (0.4 mg total) under the tongue every 5 (five) minutes as needed for chest pain. X 3 doses, Disp: 25 tablet, Rfl: 1   rosuvastatin (CRESTOR) 40 MG tablet, TAKE 1 TABLET (40 MG TOTAL) BY MOUTH DAILY., Disp: 90 tablet, Rfl: 1   tiZANidine (ZANAFLEX) 4 MG tablet, Take 1 tablet (4 mg total) by mouth every 8 (eight) hours as needed for muscle spasms., Disp: 60 tablet, Rfl: 1   vitamin B-12 (CYANOCOBALAMIN) 500 MCG tablet, Take 500 mcg by mouth daily., Disp: , Rfl:    Allergies  Allergen Reactions   Chantix [Varenicline Tartrate] Nausea Only    Codeine Nausea And Vomiting   Dilaudid [Hydromorphone Hcl] Nausea And Vomiting     ROS See HPI for history of present illness.  Physical Exam            A&P  COVID Pt has hx of CAD, HTN and bronchitis in the past.  Pt agreeable to try Molnupiravir 800mg  twice daily for 5 days Tessalon 100-200mg  3 times daily as needed for cough Encouraged fluids, rest, tylenol and ibuprofen as needed Follow up with PCP later this week if not improving.  Discussed signs/symptoms for urgent/emergent in-person evaluation    Patient voiced understanding and agreement to plan.   Time:   Today, I have spent 15 minutes with the  patient with telehealth technology discussing the above problems, reviewing the chart, previous notes, medications and orders.    Tests Ordered: No orders of the defined types were placed in this encounter.   Medication Changes: No orders of the defined types were placed in this encounter.    Disposition:  Follow up PCP later this week as needed. Discussed signs/symptoms for urgent/emergent in-person evaluation.  Etta Grandchild, PA-C  09/27/2020 11:52 AM

## 2020-10-05 ENCOUNTER — Other Ambulatory Visit: Payer: Self-pay

## 2020-10-05 MED FILL — Rosuvastatin Calcium Tab 40 MG: ORAL | 30 days supply | Qty: 30 | Fill #2 | Status: AC

## 2020-10-05 MED FILL — Gabapentin Cap 300 MG: ORAL | 30 days supply | Qty: 120 | Fill #2 | Status: AC

## 2020-10-05 MED FILL — Lisinopril Tab 5 MG: ORAL | 30 days supply | Qty: 30 | Fill #2 | Status: AC

## 2020-10-05 MED FILL — Carvedilol Tab 3.125 MG: ORAL | 30 days supply | Qty: 60 | Fill #2 | Status: AC

## 2020-10-06 ENCOUNTER — Other Ambulatory Visit: Payer: Self-pay

## 2020-12-05 ENCOUNTER — Other Ambulatory Visit: Payer: Self-pay | Admitting: Family Medicine

## 2020-12-05 ENCOUNTER — Other Ambulatory Visit: Payer: Self-pay

## 2020-12-05 DIAGNOSIS — I10 Essential (primary) hypertension: Secondary | ICD-10-CM

## 2020-12-05 MED FILL — Rosuvastatin Calcium Tab 40 MG: ORAL | 30 days supply | Qty: 30 | Fill #3 | Status: AC

## 2020-12-05 MED FILL — Gabapentin Cap 300 MG: ORAL | 30 days supply | Qty: 120 | Fill #3 | Status: AC

## 2020-12-05 MED FILL — Lisinopril Tab 5 MG: ORAL | 30 days supply | Qty: 30 | Fill #3 | Status: AC

## 2020-12-05 NOTE — Telephone Encounter (Signed)
Requested medication (s) are due for refill today: yes  Requested medication (s) are on the active medication list: yes  Last refill:  02/22/20  Future visit scheduled: no  Notes to clinic:  called pt and LM on VM to make appt. Phone number provided.   Requested Prescriptions  Pending Prescriptions Disp Refills   carvedilol (COREG) 3.125 MG tablet 180 tablet 1    Sig: TAKE 1 TABLET (3.125 MG TOTAL) BY MOUTH 2 (TWO) TIMES DAILY WITH A MEAL.     Cardiovascular:  Beta Blockers Failed - 12/05/2020 11:31 AM      Failed - Last BP in normal range    BP Readings from Last 1 Encounters:  09/06/20 (!) 150/80          Failed - Valid encounter within last 6 months    Recent Outpatient Visits           9 months ago [place], Charlane Ferretti, MD   1 year ago Sciatica of right side   [place], Charlane Ferretti, MD   2 years ago PAD (peripheral artery disease) [facility])   [place], Charlane Ferretti, MD   2 years ago CAD S/P DES PCI TO mLAD - Synergy DES 3.0 mm x 12 mm   [place], Enobong, MD   2 years ago Other hemorrhoids   [place], Charlane Ferretti, MD              Passed - Last Heart Rate in normal range    Pulse Readings from Last 1 Encounters:  09/06/20 79

## 2020-12-08 ENCOUNTER — Other Ambulatory Visit: Payer: Self-pay

## 2020-12-08 MED ORDER — CARVEDILOL 3.125 MG PO TABS
3.1250 mg | ORAL_TABLET | Freq: Two times a day (BID) | ORAL | 0 refills | Status: DC
  Filled 2020-12-08 – 2020-12-16 (×2): qty 60, 30d supply, fill #0

## 2020-12-09 ENCOUNTER — Other Ambulatory Visit: Payer: Self-pay

## 2020-12-16 ENCOUNTER — Other Ambulatory Visit: Payer: Self-pay

## 2021-01-17 ENCOUNTER — Other Ambulatory Visit: Payer: Self-pay

## 2021-01-17 MED FILL — Gabapentin Cap 300 MG: ORAL | 30 days supply | Qty: 120 | Fill #4 | Status: AC

## 2021-02-20 ENCOUNTER — Other Ambulatory Visit: Payer: Self-pay | Admitting: Family Medicine

## 2021-02-20 ENCOUNTER — Other Ambulatory Visit: Payer: Self-pay

## 2021-02-20 DIAGNOSIS — N951 Menopausal and female climacteric states: Secondary | ICD-10-CM

## 2021-02-20 DIAGNOSIS — I10 Essential (primary) hypertension: Secondary | ICD-10-CM

## 2021-02-20 DIAGNOSIS — I2510 Atherosclerotic heart disease of native coronary artery without angina pectoris: Secondary | ICD-10-CM

## 2021-02-20 DIAGNOSIS — Z9861 Coronary angioplasty status: Secondary | ICD-10-CM

## 2021-02-20 MED ORDER — ROSUVASTATIN CALCIUM 40 MG PO TABS
ORAL_TABLET | Freq: Every day | ORAL | 0 refills | Status: DC
  Filled 2021-02-20: qty 30, 30d supply, fill #0
  Filled 2021-04-07: qty 30, 30d supply, fill #1
  Filled 2021-05-09: qty 30, 30d supply, fill #0

## 2021-02-20 MED ORDER — GABAPENTIN 300 MG PO CAPS
ORAL_CAPSULE | ORAL | 1 refills | Status: DC
  Filled 2021-02-20: qty 120, 30d supply, fill #0
  Filled 2021-04-07: qty 120, 30d supply, fill #1

## 2021-02-20 NOTE — Telephone Encounter (Signed)
Requested medications are due for refill today yes  Requested medications are on the active medication list yes  Last refill 12/05/20  Last visit 02/22/20  Future visit scheduled 04/26/21  Notes to clinic Failed protocol due to no valid visit within 6  months, please assess.  Requested Prescriptions  Pending Prescriptions Disp Refills   lisinopril (ZESTRIL) 5 MG tablet 90 tablet 1    Sig: TAKE 1 TABLET (5 MG TOTAL) BY MOUTH DAILY.     Cardiovascular:  ACE Inhibitors Failed - 02/20/2021  1:52 PM      Failed - Cr in normal range and within 180 days    Creatinine, Ser  Date Value Ref Range Status  05/18/2019 1.03 (H) 0.57 - 1.00 mg/dL Final          Failed - K in normal range and within 180 days    Potassium  Date Value Ref Range Status  05/18/2019 5.4 (H) 3.5 - 5.2 mmol/L Final          Failed - Last BP in normal range    BP Readings from Last 1 Encounters:  09/06/20 (!) 150/80          Failed - Valid encounter within last 6 months    Recent Outpatient Visits           12 months ago [place], Charlane Ferretti, MD   1 year ago Sciatica of right side   [place], Charlane Ferretti, MD   2 years ago PAD (peripheral artery disease) ()   [place], Charlane Ferretti, MD   2 years ago CAD S/P DES PCI TO mLAD - Synergy DES 3.0 mm x 12 mm   [place], [place], MD   3 years ago Other hemorrhoids   [place], Charlane Ferretti, MD       Future Appointments             In 2 weeks [place], Mertie Clause, MD [facility] [place], [facility]   In 2 months Charlott Rakes, MD [place] - Patient is not pregnant       carvedilol (COREG) 3.125 MG tablet 60 tablet 0    Sig: Take 1 tablet (3.125 mg total) by mouth 2 (two) times daily with a meal.      Cardiovascular:  Beta Blockers Failed - 02/20/2021  1:52 PM      Failed - Last BP in normal range    BP Readings from Last 1 Encounters:  09/06/20 (!) 150/80          Failed - Valid encounter within last 6 months    Recent Outpatient Visits           12 months ago [place], Charlane Ferretti, MD   1 year ago Sciatica of right side   [place], Charlane Ferretti, MD   2 years ago PAD (peripheral artery disease) [facility])   [place], Charlane Ferretti, MD   2 years ago CAD S/P DES PCI TO mLAD - Synergy DES 3.0 mm x 12 mm   [place], Enobong, MD   3 years ago Other hemorrhoids   [facility] Charlott Rakes, MD       Future Appointments             In 2 weeks [place], Mertie Clause, MD [facility] [place], [place]   In 2 months Charlott Rakes, MD [place] - Last Heart Rate in normal range    Pulse Readings from Last 1 Encounters:  09/06/20 79          Signed Prescriptions Disp Refills   gabapentin (NEURONTIN) 300 MG capsule 120 capsule 1    Sig: TAKE 2 CAPSULES BY MOUTH 2 TIMES DAILY.     Neurology: Anticonvulsants - gabapentin Failed - 02/20/2021  1:52 PM      Failed - Valid encounter within last 12 months    Recent Outpatient Visits           12 months ago [place], Enobong, MD   1 year ago Sciatica of right side   [place], Charlane Ferretti, MD   2 years ago PAD (peripheral artery disease) [facility])   [place], Charlane Ferretti, MD   2 years ago CAD S/P DES PCI TO mLAD - Synergy DES 3.0 mm x 12 mm   [place], Enobong, MD   3 years ago Other hemorrhoids   [place], Enobong, MD       Future Appointments             In 2 weeks [place], Mertie Clause, MD [facility] [place], [facility]   In 2 months Charlott Rakes, MD [place]             rosuvastatin (CRESTOR) 40 MG tablet 90 tablet 0    Sig: TAKE 1 TABLET (40 MG TOTAL) BY MOUTH DAILY.     Cardiovascular:  Antilipid - Statins Failed - 02/20/2021  1:52 PM      Failed - HDL in normal range and within 360 days    HDL  Date Value Ref Range Status  02/29/2020 30 (L) >39 mg/dL Final          Failed - Triglycerides in normal range and within 360 days    Triglycerides  Date Value Ref Range Status  02/29/2020 260 (H) 0 - 149 mg/dL Final          Failed - Valid encounter within last 12 months    Recent Outpatient Visits           12 months ago [employer], Charlane Ferretti, MD   1 year ago Sciatica of right side   [place], Charlane Ferretti, MD   2 years ago PAD (peripheral artery disease) ([place])   [place], Charlane Ferretti, MD   2 years ago CAD S/P DES PCI TO mLAD - Synergy DES 3.0 mm x 12 mm   [place], Enobong, MD   3 years ago Other hemorrhoids   [place], Enobong, MD       Future Appointments             In 2 weeks [place], Mertie Clause, MD [facility] [place], [facility]   In 2 months [place],  Charlane Ferretti, MD [place] - Total Cholesterol in normal range and within 360 days    Cholesterol, Total  Date Value Ref Range Status  02/29/2020 130 100 - 199 mg/dL Final          Passed - LDL in normal range and within 360 days    LDL Chol Calc (NIH)  Date Value Ref Range Status  02/29/2020 58 0 - 99 mg/dL Final          Passed - Patient is not pregnant

## 2021-02-20 NOTE — Telephone Encounter (Signed)
Requested Prescriptions  Pending Prescriptions Disp Refills  . gabapentin (NEURONTIN) 300 MG capsule 120 capsule 1    Sig: TAKE 2 CAPSULES BY MOUTH 2 TIMES DAILY.     Neurology: Anticonvulsants - gabapentin Failed - 02/20/2021  1:52 PM      Failed - Valid encounter within last 12 months    Recent Outpatient Visits          12 months ago [place], Enobong, MD   1 year ago Sciatica of right side   [place], Charlane Ferretti, MD   2 years ago PAD (peripheral artery disease) [facility])   [place], Charlane Ferretti, MD   2 years ago CAD S/P DES PCI TO mLAD - Synergy DES 3.0 mm x 12 mm   [place], Charlane Ferretti, MD   3 years ago Other hemorrhoids   [place], Enobong, MD      Future Appointments            In 2 weeks [place], Mertie Clause, MD [facility]   In 2 months Charlott Rakes, MD [place]           . rosuvastatin (CRESTOR) 40 MG tablet 90 tablet 0    Sig: TAKE 1 TABLET (40 MG TOTAL) BY MOUTH DAILY.     Cardiovascular:  Antilipid - Statins Failed - 02/20/2021  1:52 PM      Failed - HDL in normal range and within 360 days    HDL  Date Value Ref Range Status  02/29/2020 30 (L) >39 mg/dL Final         Failed - Triglycerides in normal range and within 360 days    Triglycerides  Date Value Ref Range Status  02/29/2020 260 (H) 0 - 149 mg/dL Final         Failed - Valid encounter within last 12 months    Recent Outpatient Visits          12 months ago [place], Charlane Ferretti, MD   1 year ago Sciatica of right side   [place], Charlane Ferretti, MD   2 years ago PAD (peripheral artery disease) [facility] [address])   [place], Charlane Ferretti, MD    2 years ago CAD S/P DES PCI TO mLAD - Synergy DES 3.0 mm x 12 mm   [place], Charlane Ferretti, MD   3 years ago Other hemorrhoids   [place], Charlane Ferretti, MD      Future Appointments            In 2 weeks [place], Mertie Clause, MD [facility] [place]   In 2 months Charlott Rakes, MD [place] - Total Cholesterol in normal range and within 360 days    Cholesterol, Total  Date Value Ref Range Status  02/29/2020 130 100 - 199 mg/dL Final         Passed - LDL in normal range and within 360 days    LDL Chol Calc (NIH)  Date Value Ref Range Status  02/29/2020 58 0 - 99 mg/dL Final  Passed - Patient is not pregnant      . lisinopril (ZESTRIL) 5 MG tablet 90 tablet 1    Sig: TAKE 1 TABLET (5 MG TOTAL) BY MOUTH DAILY.     Cardiovascular:  ACE Inhibitors Failed - 02/20/2021  1:52 PM      Failed - Cr in normal range and within 180 days    Creatinine, Ser  Date Value Ref Range Status  05/18/2019 1.03 (H) 0.57 - 1.00 mg/dL Final         Failed - K in normal range and within 180 days    Potassium  Date Value Ref Range Status  05/18/2019 5.4 (H) 3.5 - 5.2 mmol/L Final         Failed - Last BP in normal range    BP Readings from Last 1 Encounters:  09/06/20 (!) 150/80         Failed - Valid encounter within last 6 months    Recent Outpatient Visits          12 months ago [place], Charlane Ferretti, MD   1 year ago Sciatica of right side   [place], Charlane Ferretti, MD   2 years ago PAD (peripheral artery disease) ([place])   [place], Charlane Ferretti, MD   2 years ago CAD S/P DES PCI TO mLAD - Synergy DES 3.0 mm x 12 mm   [place], Charlane Ferretti, MD   3 years ago Other hemorrhoids   [place], Enobong, MD      Future Appointments            In 2 weeks [place], Mertie Clause, MD [facility]   In 2 months Charlott Rakes, MD [place] - Patient is not pregnant      . carvedilol (COREG) 3.125 MG tablet 60 tablet 0    Sig: Take 1 tablet (3.125 mg total) by mouth 2 (two) times daily with a meal.     Cardiovascular:  Beta Blockers Failed - 02/20/2021  1:52 PM      Failed - Last BP in normal range    BP Readings from Last 1 Encounters:  09/06/20 (!) 150/80         Failed - Valid encounter within last 6 months    Recent Outpatient Visits          12 months ago [place], Charlane Ferretti, MD   1 year ago Sciatica of right side   [place], Charlane Ferretti, MD   2 years ago PAD (peripheral artery disease) ([place])   [place], Charlane Ferretti, MD   2 years ago CAD S/P DES PCI TO mLAD - Synergy DES 3.0 mm x 12 mm   [place], Charlane Ferretti, MD   3 years ago Other hemorrhoids   [place], Enobong, MD      Future Appointments            In 2 weeks [place], Mertie Clause, MD [facility] [place]   In 2 months Charlott Rakes, MD [place]  Passed - Last Heart Rate in normal range    Pulse Readings from Last 1 Encounters:  09/06/20 79

## 2021-02-21 ENCOUNTER — Other Ambulatory Visit: Payer: Self-pay

## 2021-02-23 MED ORDER — LISINOPRIL 5 MG PO TABS
ORAL_TABLET | Freq: Every day | ORAL | 0 refills | Status: DC
  Filled 2021-02-23: qty 30, 30d supply, fill #0
  Filled 2021-04-07: qty 30, 30d supply, fill #1

## 2021-02-23 MED ORDER — CARVEDILOL 3.125 MG PO TABS
3.1250 mg | ORAL_TABLET | Freq: Two times a day (BID) | ORAL | 0 refills | Status: DC
  Filled 2021-02-23: qty 60, 30d supply, fill #0
  Filled 2021-04-07: qty 60, 30d supply, fill #1

## 2021-02-24 ENCOUNTER — Other Ambulatory Visit: Payer: Self-pay

## 2021-02-27 ENCOUNTER — Other Ambulatory Visit: Payer: Self-pay

## 2021-03-07 ENCOUNTER — Ambulatory Visit: Payer: 59 | Admitting: Cardiovascular Disease

## 2021-03-07 ENCOUNTER — Encounter: Payer: Self-pay | Admitting: Cardiovascular Disease

## 2021-03-07 ENCOUNTER — Other Ambulatory Visit: Payer: Self-pay

## 2021-03-07 VITALS — BP 138/84 | HR 94 | Ht 64.0 in | Wt 172.0 lb

## 2021-03-07 DIAGNOSIS — I25118 Atherosclerotic heart disease of native coronary artery with other forms of angina pectoris: Secondary | ICD-10-CM | POA: Diagnosis not present

## 2021-03-07 DIAGNOSIS — I739 Peripheral vascular disease, unspecified: Secondary | ICD-10-CM

## 2021-03-07 DIAGNOSIS — E785 Hyperlipidemia, unspecified: Secondary | ICD-10-CM

## 2021-03-07 DIAGNOSIS — Z720 Tobacco use: Secondary | ICD-10-CM | POA: Diagnosis not present

## 2021-03-07 NOTE — Progress Notes (Signed)
Cardiology Office Note   Date:  03/07/2021   ID:  Robin Walsh, DOB 10-Jan-1966, MRN [id number]  PCP:  Charlott Rakes, MD  Cardiologist:  Dr. Marlou Porch  Chief Complaint  Patient presents with   Follow-up    6 months.      History of Present Illness: Robin Walsh is a 55 y.o. female who presents for a follow-up visit regarding  peripheral arterial disease. She has known history of coronary artery disease, hypertension, hyperlipidemia and tobacco use. She is status post LAD PCI in 2017. She is status post bilateral common iliac artery kissing stent placement in 12/2015 for severe claudication. She had no significant infrainguinal disease. She had recurrent claudication in 2020 due to severe in-stent restenosis.  Preprocedure labs revealed severe anemia with a hemoglobin of 7.5 and thus the procedure was postponed.  She was hospitalized and transfused.  She was then seen by GI and had EGD and colonoscopy.  She had few polyps removed and she also reports having an anal fissure that was causing the bleeding.  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  I performed successful drug-coated balloon angioplasty to bilateral common iliac arteries.    Most recent vascular studies in May 2021 showed a slight decrease in ABI compared to before.  It was 0.83 on the right and 0.93 on the left.  Duplex showed evidence of significant in-stent restenosis with peak velocity of 438 on the right and 367 on the left.  She reports worsening bilateral leg claudication since last visit especially on the right side.  Unfortunately, she continues to smoke half a pack per day and she does admit that she does not take rosuvastatin all the time.  No chest pain or shortness of breath.  Past Medical History:  Diagnosis Date   Anal fissure    Anemia    CAD (coronary artery disease)    Hyperlipidemia    Hypertension     Past  Surgical History:  Procedure Laterality Date   ABDOMINAL AORTOGRAM W/LOWER EXTREMITY N/A 01/14/2019   Procedure: ABDOMINAL AORTOGRAM W/LOWER EXTREMITY;  Surgeon: Wellington Hampshire, MD;  Location: [place] [facility];  Service: Cardiovascular;  Laterality: N/A;  limited   CARDIAC CATHETERIZATION N/A 11/28/2015   Procedure: Left Heart Cath and Coronary Angiography;  Surgeon: Jettie Booze, MD;  Location: [place] [facility];  Service: Cardiovascular;  Laterality: N/A;   CARDIAC CATHETERIZATION N/A 11/28/2015   Procedure: Coronary Stent Intervention;  Surgeon: Jettie Booze, MD;  Location: [place] [facility];  Service: Cardiovascular;  Laterality: N/A;  DES to Mid LAD; 3.0x12 Synergy   LEG SURGERY Left 2002?   tibial plateau fracture   PERIPHERAL VASCULAR BALLOON ANGIOPLASTY Bilateral 01/14/2019   Procedure: PERIPHERAL VASCULAR BALLOON ANGIOPLASTY;  Surgeon: Wellington Hampshire, MD;  Location: [place] [facility];  Service: Cardiovascular;  Laterality: Bilateral;  common iliac   PERIPHERAL VASCULAR CATHETERIZATION N/A 01/04/2016   Procedure: Abdominal Aortogram;  Surgeon: Nelva Bush, MD;  Location: [place] [facility];  Service: Cardiovascular;  Laterality: N/A;   PERIPHERAL VASCULAR CATHETERIZATION Bilateral 01/04/2016   Procedure: Lower Extremity Angiography;  Surgeon: Nelva Bush, MD;  Location: [place] [facility];  Service: Cardiovascular;  Laterality: Bilateral;   PERIPHERAL VASCULAR CATHETERIZATION Bilateral 01/04/2016   Procedure: Peripheral Vascular Intervention;  Surgeon: Nelva Bush, MD;  Location: [place] [facility];  Service: Cardiovascular;  Laterality: Bilateral;  common iliac  Current Outpatient Medications  Medication Sig Dispense Refill   acetaminophen (TYLENOL) 500 MG tablet Take 500 mg by mouth every 6 (six) hours as needed for headache (for headaches).      albuterol (VENTOLIN HFA) 108 (90 Base) MCG/ACT inhaler Inhale 1-2 puffs into the lungs every 6 (six)  hours as needed for wheezing or shortness of breath. 18 g 0   aspirin 81 MG chewable tablet Chew 1 tablet (81 mg total) by mouth daily. (Patient taking differently: Chew 81 mg by mouth every evening.) 30 tablet 11   benzonatate (TESSALON) 100 MG capsule Take 1-2 capsules (100-200 mg total) by mouth 3 (three) times daily as needed. 20 capsule 0   carvedilol (COREG) 3.125 MG tablet Take 1 tablet (3.125 mg total) by mouth 2 (two) times daily with a meal. 180 tablet 0   gabapentin (NEURONTIN) 300 MG capsule TAKE 2 CAPSULES BY MOUTH 2 TIMES DAILY. *Must attend Jan doctor appointment for further refills* 120 capsule 1   lisinopril (ZESTRIL) 5 MG tablet TAKE 1 TABLET (5 MG TOTAL) BY MOUTH DAILY. 90 tablet 0   meloxicam (MOBIC) 7.5 MG tablet Take 1 tablet (7.5 mg total) by mouth daily. 30 tablet 1   nitroGLYCERIN (NITROSTAT) 0.4 MG SL tablet Place 1 tablet (0.4 mg total) under the tongue every 5 (five) minutes as needed for chest pain. X 3 doses 25 tablet 1   rosuvastatin (CRESTOR) 40 MG tablet TAKE 1 TABLET (40 MG TOTAL) BY MOUTH DAILY. 90 tablet 0   tiZANidine (ZANAFLEX) 4 MG tablet Take 1 tablet (4 mg total) by mouth every 8 (eight) hours as needed for muscle spasms. 60 tablet 1   vitamin B-12 (CYANOCOBALAMIN) 500 MCG tablet Take 500 mcg by mouth daily.     ferrous sulfate 325 (65 FE) MG EC tablet Take 1 tablet (325 mg total) by mouth 2 (two) times daily. 60 tablet 3   No current facility-administered medications for this visit.    Allergies:   Chantix [varenicline tartrate], Codeine, and Dilaudid [hydromorphone hcl]    Social History:  The patient  reports that she has been smoking cigarettes. She has a 12.50 pack-year smoking history. She has never used smokeless tobacco. She reports current drug use. Drug: Marijuana. She reports that she does not drink alcohol.   Family History:  The patient's family history includes Alcohol abuse (age of onset: 30) in her father; Congestive Heart Failure (age of  onset: 74) in her mother; Heart disease in her brother; Liver disease in her sister.    ROS:  Please see the history of present illness.   Otherwise, review of systems are positive for none.   All other systems are reviewed and negative.    PHYSICAL EXAM: VS:  BP 138/84 (BP Location: Left Arm, Patient Position: Sitting, Cuff Size: Normal)   Pulse 94   Ht 5\' 4"  (1.626 m)   Wt 172 lb (78 kg)   LMP 03/26/2017   BMI 29.52 kg/m  , BMI Body mass index is 29.52 kg/m. GEN: Well nourished, well developed, in no acute distress  HEENT: normal  Neck: no JVD, carotid bruits, or masses Cardiac: RRR; no murmurs, rubs, or gallops,no edema  Respiratory:  clear to auscultation bilaterally, normal work of breathing GI: soft, nontender, nondistended, + BS MS: no deformity or atrophy  Skin: warm and dry, no rash Neuro:  Strength and sensation are intact Psych: euthymic mood, full affect Vascular: Femoral pulses: +1 bilaterally.  Distal pulses are palpable.  EKG:  EKG is not ordered today.    Recent Labs: No results found for requested labs within last 8760 hours.    Lipid Panel    Component Value Date/Time   CHOL 130 02/29/2020 0851   TRIG 260 (H) 02/29/2020 0851   HDL 30 (L) 02/29/2020 0851   CHOLHDL 4.3 02/29/2020 0851   CHOLHDL 7.8 11/27/2015 0453   VLDL 66 (H) 11/27/2015 0453   LDLCALC 58 02/29/2020 0851      Wt Readings from Last 3 Encounters:  03/07/21 172 lb (78 kg)  09/06/20 171 lb 9.6 oz (77.8 kg)  03/08/20 165 lb 6.4 oz (75 kg)       No flowsheet data found.    ASSESSMENT AND PLAN:  1.  Peripheral arterial disease: Status post drug-coated balloon angioplasty for severe bilateral common iliac artery in-stent restenosis.  Recurrent moderate to severe claudication at the present time after walking 1 block.  This is due to in-stent restenosis mostly on the right side.  Her femoral pulses are barely palpable and I suspect progression of disease given continued  uncontrolled risk factors.  She still feels that her symptoms are not lifestyle limiting and thus we will continue with medical therapy.  I requested repeat Doppler studies to be done in May.  Suspect that she will require revascularization in the near future given severity of her symptoms.  2. Tobacco use: I again discussed with her the importance of smoking cessation  3. Coronary artery disease involving native coronary arteries with other forms of angina:   Continue medical therapy.  No anginal symptoms at the present time.  4. Hyperlipidemia: Most recent lipid profile showed an LDL of 58.  Triglycerides were mildly elevated.  She will have a physical with her primary care physician early next year.  Continue rosuvastatin.  I discussed with her the importance of taking the medication regularly.  I provided her with another temporary 6 months handicap form given her claudication.    Disposition:   FU with me in 6 months  Signed,  [name], MD  03/07/2021 9:14 AM    [place]

## 2021-03-07 NOTE — Patient Instructions (Signed)
Medication Instructions:  Your physician recommends that you continue on your current medications as directed. Please refer to the Current Medication list given to you today.  *If you need a refill on your cardiac medications before your next appointment, please call your pharmacy*  Testing/Procedures: Your physician has requested that you have an ankle brachial index (ABI) in May 2023. During this test an ultrasound and blood pressure cuff are used to evaluate the arteries that supply the arms and legs with blood. Allow thirty minutes for this exam. There are no restrictions or special instructions.  Your physician has requested that you have an aorto-iliac duplex in May 2023. During this test, an ultrasound is used to evaluate the aorta and iliac arteries. Do not eat after midnight the day before and avoid carbonated beverages  Follow-Up: At [facility], you and your health needs are our priority.  As part of our continuing mission to provide you with exceptional heart care, we have created designated Provider Care Teams.  These Care Teams include your primary Cardiologist (physician) and Advanced Practice Providers (APPs -  Physician Assistants and Nurse Practitioners) who all work together to provide you with the care you need, when you need it.  We recommend signing up for the patient portal called "MyChart".  Sign up information is provided on this After Visit Summary.  MyChart is used to connect with patients for Virtual Visits (Telemedicine).  Patients are able to view lab/test results, encounter notes, upcoming appointments, etc.  Non-urgent messages can be sent to your provider as well.   To learn more about what you can do with MyChart, go to [email or web address].    Your next appointment:   May (1 week after testing)  The format for your next appointment:   In Person  Provider:   Dr. Fletcher Anon

## 2021-03-18 ENCOUNTER — Telehealth (HOSPITAL_COMMUNITY): Payer: Self-pay | Admitting: Emergency Medicine

## 2021-03-18 ENCOUNTER — Ambulatory Visit (HOSPITAL_COMMUNITY)
Admission: EM | Admit: 2021-03-18 | Discharge: 2021-03-18 | Disposition: A | Discharge status: Home / Self Care | Payer: 59 | Attending: Emergency Medicine | Admitting: Emergency Medicine

## 2021-03-18 ENCOUNTER — Encounter (HOSPITAL_COMMUNITY): Payer: Self-pay

## 2021-03-18 ENCOUNTER — Other Ambulatory Visit: Payer: Self-pay

## 2021-03-18 DIAGNOSIS — J069 Acute upper respiratory infection, unspecified: Secondary | ICD-10-CM

## 2021-03-18 MED ORDER — ALBUTEROL SULFATE HFA 108 (90 BASE) MCG/ACT IN AERS
1.0000 | INHALATION_SPRAY | Freq: Four times a day (QID) | RESPIRATORY_TRACT | 0 refills | Status: DC | PRN
Start: 2021-03-18 — End: 2021-03-18

## 2021-03-18 MED ORDER — HYDROCOD POLST-CPM POLST ER 10-8 MG/5ML PO SUER
5.0000 mL | Freq: Two times a day (BID) | ORAL | 0 refills | Status: AC
Start: 2021-03-18 — End: 2021-03-23

## 2021-03-18 MED ORDER — HYDROCOD POLST-CPM POLST ER 10-8 MG/5ML PO SUER
5.0000 mL | Freq: Two times a day (BID) | ORAL | 0 refills | Status: DC
  Filled 2021-03-18: qty 50, 5d supply, fill #0

## 2021-03-18 MED ORDER — ALBUTEROL SULFATE HFA 108 (90 BASE) MCG/ACT IN AERS
1.0000 | INHALATION_SPRAY | Freq: Four times a day (QID) | RESPIRATORY_TRACT | 0 refills | Status: DC | PRN
Start: 2021-03-18 — End: 2021-11-27

## 2021-03-18 MED ORDER — ALBUTEROL SULFATE HFA 108 (90 BASE) MCG/ACT IN AERS
1.0000 | INHALATION_SPRAY | Freq: Four times a day (QID) | RESPIRATORY_TRACT | 0 refills | Status: DC | PRN
  Filled 2021-03-18: qty 1, fill #0

## 2021-03-18 MED ORDER — HYDROCOD POLST-CPM POLST ER 10-8 MG/5ML PO SUER: 5.0000 mL | Freq: Two times a day (BID) | ORAL | 0 refills | Status: DC

## 2021-03-18 NOTE — Telephone Encounter (Signed)
Medications sent to wrong pharmacy

## 2021-03-18 NOTE — Telephone Encounter (Signed)
Patient needs pharmacy changed

## 2021-03-18 NOTE — ED Provider Notes (Signed)
[place]  ____________________________________________  Time seen: Approximately 11:40 AM  I have reviewed the triage vital signs and the nursing notes.   HISTORY  Chief Complaint Sore Throat   Historian Patient     HPI Robin Walsh is a 55 y.o. female presents to the urgent care with cough, nasal congestion and body aches for the past 3 to 4 days.  Patient has been taking Mucinex at home with little relief.  She has had some low-grade fever but denies vomiting or diarrhea.   Past Medical History:  Diagnosis Date   Anal fissure    Anemia    CAD (coronary artery disease)    Hyperlipidemia    Hypertension      Immunizations up to date:  Yes.     Past Medical History:  Diagnosis Date   Anal fissure    Anemia    CAD (coronary artery disease)    Hyperlipidemia    Hypertension     Patient Active Problem List   Diagnosis Date Noted   Symptomatic anemia 10/23/2018   Hemorrhoids 10/23/2018   Leukocytosis 10/23/2018   Renal insufficiency 10/23/2018   Tobacco abuse 10/23/2018   Vitamin D deficiency 02/11/2018   Hot flash, menopausal 02/06/2017   PAD (peripheral artery disease) ([place]) 01/04/2016   Medication management 12/09/2015   Chest pain    Abnormal finding on EKG - anterior T-wave inversions concerning for LAD ischemia 11/28/2015   CAD S/P DES PCI TO mLAD - Synergy DES 3.0 mm x 12 mm 11/28/2015   Unstable angina ([place]) 11/27/2015   Essential hypertension 11/27/2015   Dyslipidemia, goal LDL below 70 11/27/2015   Tobacco dependence 11/27/2015   Noncompliance 11/27/2015   Marijuana dependence ([place]) 11/27/2015   Right hip pain 11/27/2015    Past Surgical History:  Procedure Laterality Date   ABDOMINAL AORTOGRAM W/LOWER EXTREMITY N/A 01/14/2019   Procedure: ABDOMINAL AORTOGRAM W/LOWER EXTREMITY;  Surgeon: Wellington Hampshire, MD;  Location: [facility];  Service: Cardiovascular;  Laterality: N/A;  limited   CARDIAC CATHETERIZATION N/A  11/28/2015   Procedure: Left Heart Cath and Coronary Angiography;  Surgeon: Jettie Booze, MD;  Location: [place] [facility];  Service: Cardiovascular;  Laterality: N/A;   CARDIAC CATHETERIZATION N/A 11/28/2015   Procedure: Coronary Stent Intervention;  Surgeon: Jettie Booze, MD;  Location: [facility];  Service: Cardiovascular;  Laterality: N/A;  DES to Mid LAD; 3.0x12 Synergy   LEG SURGERY Left 2002?   tibial plateau fracture   PERIPHERAL VASCULAR BALLOON ANGIOPLASTY Bilateral 01/14/2019   Procedure: PERIPHERAL VASCULAR BALLOON ANGIOPLASTY;  Surgeon: Wellington Hampshire, MD;  Location: [facility];  Service: Cardiovascular;  Laterality: Bilateral;  common iliac   PERIPHERAL VASCULAR CATHETERIZATION N/A 01/04/2016   Procedure: Abdominal Aortogram;  Surgeon: Nelva Bush, MD;  Location: [facility];  Service: Cardiovascular;  Laterality: N/A;   PERIPHERAL VASCULAR CATHETERIZATION Bilateral 01/04/2016   Procedure: Lower Extremity Angiography;  Surgeon: Nelva Bush, MD;  Location: [facility];  Service: Cardiovascular;  Laterality: Bilateral;   PERIPHERAL VASCULAR CATHETERIZATION Bilateral 01/04/2016   Procedure: Peripheral Vascular Intervention;  Surgeon: Nelva Bush, MD;  Location: [facility];  Service: Cardiovascular;  Laterality: Bilateral;  common iliac    Prior to Admission medications   Medication Sig Start Date End Date Taking? Authorizing Provider  albuterol (VENTOLIN HFA) 108 (90 Base) MCG/ACT inhaler Inhale 1-2 puffs into the lungs every 6 (six) hours as needed for wheezing or  shortness of breath. 03/18/21  Yes Vallarie Mare M, PA-C  chlorpheniramine-HYDROcodone (TUSSIONEX PENNKINETIC ER) 10-8 MG/5ML SUER Take 5 mLs by mouth 2 (two) times daily for 5 days. 03/18/21 03/23/21 Yes Vallarie Mare M, PA-C  acetaminophen (TYLENOL) 500 MG tablet Take 500 mg by mouth every 6 (six) hours as needed for headache (for headaches).     [provider]  aspirin 81 MG chewable tablet Chew 1 tablet (81 mg total) by mouth daily. Patient taking differently: Chew 81 mg by mouth every evening. 02/26/17   Charlott Rakes, MD  benzonatate (TESSALON) 100 MG capsule Take 1-2 capsules (100-200 mg total) by mouth 3 (three) times daily as needed. 09/27/20   [name], PA-C  carvedilol (COREG) 3.125 MG tablet Take 1 tablet (3.125 mg total) by mouth 2 (two) times daily with a meal. 02/23/21 03/29/21  Charlott Rakes, MD  ferrous sulfate 325 (65 FE) MG EC tablet Take 1 tablet (325 mg total) by mouth 2 (two) times daily. 10/24/18 02/21/19  Barb Merino, MD  gabapentin (NEURONTIN) 300 MG capsule TAKE 2 CAPSULES BY MOUTH 2 TIMES DAILY. *Must attend Jan doctor appointment for further refills* 02/20/21 02/20/22  Charlott Rakes, MD  lisinopril (ZESTRIL) 5 MG tablet TAKE 1 TABLET (5 MG TOTAL) BY MOUTH DAILY. 02/23/21 02/23/22  Charlott Rakes, MD  meloxicam (MOBIC) 7.5 MG tablet Take 1 tablet (7.5 mg total) by mouth daily. 05/14/19   Charlott Rakes, MD  nitroGLYCERIN (NITROSTAT) 0.4 MG SL tablet Place 1 tablet (0.4 mg total) under the tongue every 5 (five) minutes as needed for chest pain. X 3 doses 01/01/20   Charlott Rakes, MD  rosuvastatin (CRESTOR) 40 MG tablet TAKE 1 TABLET (40 MG TOTAL) BY MOUTH DAILY. 02/20/21 02/20/22  Charlott Rakes, MD  tiZANidine (ZANAFLEX) 4 MG tablet Take 1 tablet (4 mg total) by mouth every 8 (eight) hours as needed for muscle spasms. 05/14/19   Charlott Rakes, MD  vitamin B-12 (CYANOCOBALAMIN) 500 MCG tablet Take 500 mcg by mouth daily.    [provider]    Allergies Chantix [varenicline tartrate], Codeine, and Dilaudid [hydromorphone hcl]  Family History  Problem Relation Age of Onset   Congestive Heart Failure Mother 41   Alcohol abuse Father 3   Heart disease Brother    Liver disease Sister        kidney/liver    Social History Social History   Tobacco Use   Smoking status: Every Day     Packs/day: 0.50    Years: 25.00    Pack years: 12.50    Types: Cigarettes   Smokeless tobacco: Never  Vaping Use   Vaping Use: Never used  Substance Use Topics   Alcohol use: No   Drug use: Yes    Types: Marijuana    Comment: one week ago      Review of Systems  Constitutional: Patient has fever.  Eyes: No visual changes. No discharge ENT: Patient has congestion.  Cardiovascular: no chest pain. Respiratory: Patient has cough.  Gastrointestinal: No abdominal pain.  No nausea, no vomiting. Patient had diarrhea.  Genitourinary: Negative for dysuria. No hematuria Musculoskeletal: Patient has myalgias.  Skin: Negative for rash, abrasions, lacerations, ecchymosis. Neurological: Patient has headache, no focal weakness or numbness.    ____________________________________________   PHYSICAL EXAM:  VITAL SIGNS: ED Triage Vitals  Enc Vitals Group     BP 03/18/21 1045 133/78     Pulse Rate 03/18/21 1045 94     Resp 03/18/21 1045 16  Temp 03/18/21 1045 98.2 F (36.8 C)     Temp Source 03/18/21 1045 Oral     SpO2 03/18/21 1045 100 %     Weight --      Height --      Head Circumference --      Peak Flow --      Pain Score 03/18/21 1046 0     Pain Loc --      Pain Edu? --      Excl. in [place]? --      Constitutional: Alert and oriented. Patient is lying supine. Eyes: Conjunctivae are normal. PERRL. EOMI. Head: Atraumatic. ENT:      Ears: Tympanic membranes are mildly injected with mild effusion bilaterally.       Nose: No congestion/rhinnorhea.      Mouth/Throat: Mucous membranes are moist. Posterior pharynx is mildly erythematous.  Hematological/Lymphatic/Immunilogical: No cervical lymphadenopathy.  Cardiovascular: Normal rate, regular rhythm. Normal S1 and S2.  Good peripheral circulation. Respiratory: Normal respiratory effort without tachypnea or retractions. Lungs CTAB. Good air entry to the bases with no decreased or absent breath sounds. Gastrointestinal: Bowel  sounds 4 quadrants. Soft and nontender to palpation. No guarding or rigidity. No palpable masses. No distention. No CVA tenderness. Musculoskeletal: Full range of motion to all extremities. No gross deformities appreciated. Neurologic:  Normal speech and language. No gross focal neurologic deficits are appreciated.  Skin:  Skin is warm, dry and intact. No rash noted. Psychiatric: Mood and affect are normal. Speech and behavior are normal. Patient exhibits appropriate insight and judgement.   ____________________________________________   LABS (all labs ordered are listed, but only abnormal results are displayed)  Labs Reviewed - No data to display ____________________________________________  EKG   ____________________________________________  RADIOLOGY   No results found.  ____________________________________________    PROCEDURES  Procedure(s) performed:     Procedures     Medications - No data to display   ____________________________________________   INITIAL IMPRESSION / ASSESSMENT AND PLAN / ED COURSE  Pertinent labs & imaging results that were available during my care of the patient were reviewed by me and considered in my medical decision making (see chart for details).      Assessment and plan:  Viral URI 55 year old female presents to the urgent care with viral URI-like symptoms.  Declines viral testing.  She was prescribed Tussionex for cough at her request.  Also recommended Flonase and Tylenol and ibuprofen alternating for body aches.     ____________________________________________  FINAL CLINICAL IMPRESSION(S) / ED DIAGNOSES  Final diagnoses:  Viral URI      NEW MEDICATIONS STARTED DURING THIS VISIT:  ED Discharge Orders          Ordered    chlorpheniramine-HYDROcodone (TUSSIONEX PENNKINETIC ER) 10-8 MG/5ML SUER  2 times daily        03/18/21 1135    albuterol (VENTOLIN HFA) 108 (90 Base) MCG/ACT inhaler  Every 6 hours PRN         03/18/21 1136                This chart was dictated using voice recognition software/Dragon. Despite best efforts to proofread, errors can occur which can change the meaning. Any change was purely unintentional.     Lannie Fields, PA-C 03/18/21 1142

## 2021-03-18 NOTE — ED Triage Notes (Signed)
Pt complains of cough,body aches and congestion x 3 days.

## 2021-03-18 NOTE — Discharge Instructions (Signed)
You can take 2 puffs of albuterol every 4 hours as needed for wheezing. He can take 5 mL of Tussidex twice daily for cough. You can use Flonase, 1 spray each side. Please take 650 of Tylenol every 4 hours and 600 mg of ibuprofen for body aches.

## 2021-03-20 ENCOUNTER — Other Ambulatory Visit: Payer: Self-pay

## 2021-03-27 ENCOUNTER — Other Ambulatory Visit: Payer: Self-pay

## 2021-04-07 ENCOUNTER — Other Ambulatory Visit: Payer: Self-pay

## 2021-04-26 ENCOUNTER — Ambulatory Visit: Payer: 59 | Attending: Family Medicine | Admitting: Family Medicine

## 2021-04-26 ENCOUNTER — Other Ambulatory Visit: Payer: Self-pay

## 2021-04-26 ENCOUNTER — Encounter: Payer: Self-pay | Admitting: Family Medicine

## 2021-04-26 VITALS — BP 143/85 | HR 84 | Ht 64.0 in | Wt 167.6 lb

## 2021-04-26 DIAGNOSIS — Z9861 Coronary angioplasty status: Secondary | ICD-10-CM

## 2021-04-26 DIAGNOSIS — I10 Essential (primary) hypertension: Secondary | ICD-10-CM | POA: Diagnosis not present

## 2021-04-26 DIAGNOSIS — M5431 Sciatica, right side: Secondary | ICD-10-CM

## 2021-04-26 DIAGNOSIS — M62838 Other muscle spasm: Secondary | ICD-10-CM

## 2021-04-26 DIAGNOSIS — N951 Menopausal and female climacteric states: Secondary | ICD-10-CM

## 2021-04-26 DIAGNOSIS — I739 Peripheral vascular disease, unspecified: Secondary | ICD-10-CM | POA: Diagnosis not present

## 2021-04-26 DIAGNOSIS — R0989 Other specified symptoms and signs involving the circulatory and respiratory systems: Secondary | ICD-10-CM

## 2021-04-26 DIAGNOSIS — I2510 Atherosclerotic heart disease of native coronary artery without angina pectoris: Secondary | ICD-10-CM | POA: Diagnosis not present

## 2021-04-26 DIAGNOSIS — Z720 Tobacco use: Secondary | ICD-10-CM

## 2021-04-26 DIAGNOSIS — Z1159 Encounter for screening for other viral diseases: Secondary | ICD-10-CM

## 2021-04-26 MED ORDER — CARVEDILOL 3.125 MG PO TABS
3.1250 mg | ORAL_TABLET | Freq: Two times a day (BID) | ORAL | 0 refills | Status: DC
  Filled 2021-04-26: qty 180, 90d supply, fill #0
  Filled 2021-05-09: qty 60, 30d supply, fill #0
  Filled 2021-09-06: qty 60, 30d supply, fill #1
  Filled 2021-10-09: qty 60, 30d supply, fill #2

## 2021-04-26 MED ORDER — TIZANIDINE HCL 4 MG PO TABS
4.0000 mg | ORAL_TABLET | Freq: Four times a day (QID) | ORAL | 0 refills | Status: DC | PRN
  Filled 2021-04-26: qty 30, 8d supply, fill #0

## 2021-04-26 MED ORDER — LISINOPRIL 5 MG PO TABS
ORAL_TABLET | Freq: Every day | ORAL | 0 refills | Status: DC
  Filled 2021-04-26: qty 90, fill #0
  Filled 2021-05-09: qty 30, 30d supply, fill #0
  Filled 2021-09-06: qty 30, 30d supply, fill #1
  Filled 2021-10-09: qty 30, 30d supply, fill #2

## 2021-04-26 MED ORDER — GABAPENTIN 300 MG PO CAPS
ORAL_CAPSULE | ORAL | 1 refills | Status: DC
  Filled 2021-04-26: qty 120, fill #0

## 2021-04-26 MED ORDER — GABAPENTIN 300 MG PO CAPS
ORAL_CAPSULE | ORAL | 3 refills | Status: DC
  Filled 2021-04-26: qty 150, fill #0
  Filled 2021-05-09: qty 150, 30d supply, fill #0
  Filled 2021-09-06: qty 150, 30d supply, fill #1
  Filled 2021-10-09: qty 150, 30d supply, fill #2
  Filled 2021-11-23: qty 150, 30d supply, fill #3

## 2021-04-26 NOTE — Progress Notes (Signed)
Subjective:  Patient ID: Robin Walsh, female    DOB: 1965/05/28  Age: 56 y.o. MRN: [id number]  CC: Hypertension   HPI Robin Walsh is a 56 y.o. year old female with a history of tobacco abuse, coronary artery disease (status post PCI and drug-eluting stent placement to LAD in 11/2015) peripheral arterial disease (status post stenting of bilateral common iliac artery) who presents today for follow-up visit  Interval History: She has had a knot on the posterior R shoulder for a few months and it feels tight when she touches it. She types for a living. She has Sciatica on her R side but she is unsure if her work schedule will allow her to attend PT but her muscle relaxant and Voltaren gel have been ineffective. Last seen by Dr. Fletcher Anon for management of PAD in 02/2021) notes she most likely has in-stent restenosis in her RLE. She still has claudication pain Smokes half a ppd or less of cigarettes.  States she is allergic to Wellbutrin and Chantix makes her bloated, she is not open to trying nicotine patches as she feels it is giving her an additional dose of nicotine.  BP is slightly elevated but she was stressed at work and attributes elevation to this.  Endorses compliance with her antihypertensive. She would like to increase Gabapentin dose as she is having breakthrough sweating from her night sweats. Past Medical History:  Diagnosis Date   Anal fissure    Anemia    CAD (coronary artery disease)    Hyperlipidemia    Hypertension     Past Surgical History:  Procedure Laterality Date   ABDOMINAL AORTOGRAM W/LOWER EXTREMITY N/A 01/14/2019   Procedure: ABDOMINAL AORTOGRAM W/LOWER EXTREMITY;  Surgeon: Wellington Hampshire, MD;  Location: [facility];  Service: Cardiovascular;  Laterality: N/A;  limited   CARDIAC CATHETERIZATION N/A 11/28/2015   Procedure: Left Heart Cath and Coronary Angiography;  Surgeon: Jettie Booze, MD;  Location: [facility];  Service:  Cardiovascular;  Laterality: N/A;   CARDIAC CATHETERIZATION N/A 11/28/2015   Procedure: Coronary Stent Intervention;  Surgeon: Jettie Booze, MD;  Location: [facility];  Service: Cardiovascular;  Laterality: N/A;  DES to Mid LAD; 3.0x12 Synergy   LEG SURGERY Left 2002?   tibial plateau fracture   PERIPHERAL VASCULAR BALLOON ANGIOPLASTY Bilateral 01/14/2019   Procedure: PERIPHERAL VASCULAR BALLOON ANGIOPLASTY;  Surgeon: Wellington Hampshire, MD;  Location: [facility];  Service: Cardiovascular;  Laterality: Bilateral;  common iliac   PERIPHERAL VASCULAR CATHETERIZATION N/A 01/04/2016   Procedure: Abdominal Aortogram;  Surgeon: Nelva Bush, MD;  Location: [facility];  Service: Cardiovascular;  Laterality: N/A;   PERIPHERAL VASCULAR CATHETERIZATION Bilateral 01/04/2016   Procedure: Lower Extremity Angiography;  Surgeon: Nelva Bush, MD;  Location: [facility];  Service: Cardiovascular;  Laterality: Bilateral;   PERIPHERAL VASCULAR CATHETERIZATION Bilateral 01/04/2016   Procedure: Peripheral Vascular Intervention;  Surgeon: Nelva Bush, MD;  Location: [facility];  Service: Cardiovascular;  Laterality: Bilateral;  common iliac    Family History  Problem Relation Age of Onset   Congestive Heart Failure Mother 63   Alcohol abuse Father 17   Heart disease Brother    Liver disease Sister        kidney/liver    Allergies  Allergen Reactions   Chantix [Varenicline Tartrate] Nausea Only   Wellbutrin [Bupropion] Itching   Codeine Nausea And Vomiting   Dilaudid [Hydromorphone Hcl] Nausea And Vomiting  Outpatient Medications Prior to Visit  Medication Sig Dispense Refill   acetaminophen (TYLENOL) 500 MG tablet Take 500 mg by mouth every 6 (six) hours as needed for headache (for headaches).      albuterol (VENTOLIN HFA) 108 (90 Base) MCG/ACT inhaler Inhale 1-2 puffs into the lungs every 6 (six) hours as needed for wheezing or shortness of breath. 1  each 0   aspirin 81 MG chewable tablet Chew 1 tablet (81 mg total) by mouth daily. (Patient taking differently: Chew 81 mg by mouth every evening.) 30 tablet 11   carvedilol (COREG) 3.125 MG tablet Take 1 tablet (3.125 mg total) by mouth 2 (two) times daily with a meal. 180 tablet 0   gabapentin (NEURONTIN) 300 MG capsule TAKE 2 CAPSULES BY MOUTH 2 TIMES DAILY. *Must attend Jan doctor appointment for further refills* 120 capsule 1   lisinopril (ZESTRIL) 5 MG tablet TAKE 1 TABLET (5 MG TOTAL) BY MOUTH DAILY. 90 tablet 0   nitroGLYCERIN (NITROSTAT) 0.4 MG SL tablet Place 1 tablet (0.4 mg total) under the tongue every 5 (five) minutes as needed for chest pain. X 3 doses 25 tablet 1   rosuvastatin (CRESTOR) 40 MG tablet TAKE 1 TABLET (40 MG TOTAL) BY MOUTH DAILY. 90 tablet 0   benzonatate (TESSALON) 100 MG capsule Take 1-2 capsules (100-200 mg total) by mouth 3 (three) times daily as needed. (Patient not taking: Reported on 04/26/2021) 20 capsule 0   ferrous sulfate 325 (65 FE) MG EC tablet Take 1 tablet (325 mg total) by mouth 2 (two) times daily. 60 tablet 3   vitamin B-12 (CYANOCOBALAMIN) 500 MCG tablet Take 500 mcg by mouth daily. (Patient not taking: Reported on 04/26/2021)     meloxicam (MOBIC) 7.5 MG tablet Take 1 tablet (7.5 mg total) by mouth daily. 30 tablet 1   tiZANidine (ZANAFLEX) 4 MG tablet Take 1 tablet (4 mg total) by mouth every 8 (eight) hours as needed for muscle spasms. 60 tablet 1   No facility-administered medications prior to visit.     ROS Review of Systems  Constitutional:  Negative for activity change, appetite change and fatigue.  HENT:  Negative for congestion, sinus pressure and sore throat.   Eyes:  Negative for visual disturbance.  Respiratory:  Negative for cough, chest tightness, shortness of breath and wheezing.   Cardiovascular:  Negative for chest pain and palpitations.  Gastrointestinal:  Negative for abdominal distention, abdominal pain and constipation.   Endocrine: Negative for polydipsia.  Genitourinary:  Negative for dysuria and frequency.  Musculoskeletal:  Negative for arthralgias and back pain.  Skin:  Negative for rash.  Neurological:  Negative for tremors, light-headedness and numbness.  Hematological:  Does not bruise/bleed easily.  Psychiatric/Behavioral:  Negative for agitation and behavioral problems.    Objective:  BP (!) 143/85    Pulse 84    Ht $R'5\' 4"'GG$  (1.626 m)    Wt 167 lb 9.6 oz (76 kg)    LMP 03/26/2017    SpO2 99%    BMI 28.77 kg/m   BP/Weight 04/26/2021 03/18/2021 47/12/6281  Systolic BP 662 947 654  Diastolic BP 85 78 84  Wt. (Lbs) 167.6 - 172  BMI 28.77 - 29.52      Physical Exam Constitutional:      Appearance: She is well-developed.  Neck:     Vascular: Carotid bruit (L) present.  Cardiovascular:     Rate and Rhythm: Normal rate.     Heart sounds: Normal heart sounds. No murmur  heard.    Comments: Weak left dorsalis pedis Unable to palpate dorsalis pedis and posterior tibialis on the right Pulmonary:     Effort: Pulmonary effort is normal.     Breath sounds: Normal breath sounds. No wheezing or rales.  Chest:     Chest wall: No tenderness.  Abdominal:     General: Bowel sounds are normal. There is no distension.     Palpations: Abdomen is soft. There is no mass.     Tenderness: There is no abdominal tenderness.  Musculoskeletal:        General: Normal range of motion.     Right lower leg: No edema.     Left lower leg: No edema.     Comments: Tenderness to palpation on right trapezius muscle on extremes of motion of right upper extremity  Neurological:     Mental Status: She is alert and oriented to person, place, and time.  Psychiatric:        Mood and Affect: Mood normal.    CMP Latest Ref Rng & Units 02/29/2020 05/18/2019 01/14/2019  Glucose 65 - 99 mg/dL - 90 -  BUN 6 - 24 mg/dL - 12 -  Creatinine 0.57 - 1.00 mg/dL - 1.03(H) -  Sodium 134 - 144 mmol/L - 144 140  Potassium 3.5 - 5.2 mmol/L  - 5.4(H) 4.6  Chloride 96 - 106 mmol/L - 106 -  CO2 20 - 29 mmol/L - 21 -  Calcium 8.7 - 10.2 mg/dL - 9.8 -  Total Protein 6.0 - 8.5 g/dL 6.8 7.0 -  Total Bilirubin 0.0 - 1.2 mg/dL <0.2 0.3 -  Alkaline Phos 44 - 121 IU/L 59 76 -  AST 0 - 40 IU/L 13 14 -  ALT 0 - 32 IU/L 5 12 -    Lipid Panel     Component Value Date/Time   CHOL 130 02/29/2020 0851   TRIG 260 (H) 02/29/2020 0851   HDL 30 (L) 02/29/2020 0851   CHOLHDL 4.3 02/29/2020 0851   CHOLHDL 7.8 11/27/2015 0453   VLDL 66 (H) 11/27/2015 0453   LDLCALC 58 02/29/2020 0851    CBC    Component Value Date/Time   WBC 7.2 12/30/2018 0912   WBC 9.4 10/24/2018 0533   RBC 5.12 12/30/2018 0912   RBC 4.07 10/24/2018 0533   HGB 14.6 01/14/2019 0928   HGB 14.1 12/30/2018 0912   HCT 43.0 01/14/2019 0928   HCT 43.7 12/30/2018 0912   PLT 306 12/30/2018 0912   MCV 85 12/30/2018 0912   MCH 27.5 12/30/2018 0912   MCH 21.4 (L) 10/24/2018 0533   MCHC 32.3 12/30/2018 0912   MCHC 29.3 (L) 10/24/2018 0533   RDW 19.9 (H) 12/30/2018 0912   LYMPHSABS 2.0 10/23/2018 1406   LYMPHSABS 3.3 (H) 02/11/2018 1624   MONOABS 0.7 10/23/2018 1406   EOSABS 0.3 10/23/2018 1406   EOSABS 0.5 (H) 02/11/2018 1624   BASOSABS 0.0 10/23/2018 1406   BASOSABS 0.0 02/11/2018 1624    Lab Results  Component Value Date   HGBA1C 5.8 (H) 11/27/2015    Assessment & Plan:  1. Menopausal vasomotor syndrome Uncontrolled Increased gabapentin dose - gabapentin (NEURONTIN) 300 MG capsule; Take 2 capsules by mouth in the morning and 3 capsules in the evening  Dispense: 150 capsule; Refill: 3  2. Essential hypertension Slightly above goal Previous blood pressures have been normal hence I will make no regimen changes today Counseled on blood pressure goal of less than 130/80, low-sodium, DASH diet,  medication compliance, 150 minutes of moderate intensity exercise per week. Discussed medication compliance, adverse effects. - CMP14+EGFR - carvedilol (COREG) 3.125  MG tablet; Take 1 tablet (3.125 mg total) by mouth 2 (two) times daily with a meal.  Dispense: 180 tablet; Refill: 0 - lisinopril (ZESTRIL) 5 MG tablet; TAKE 1 TABLET (5 MG TOTAL) BY MOUTH DAILY.  Dispense: 90 tablet; Refill: 0  3. CAD S/P DES PCI TO mLAD - Synergy DES 3.0 mm x 12 mm Asymptomatic Risk factor modification including blood pressure control and cholesterol control Unfortunately she continues to smoke which is a major risk factor Due for lipid panel today Continue high intensity statin - CBC with Differential/Platelet - LP+Non-HDL Cholesterol; Future  4. PAD (peripheral artery disease) ([facility]) Uncontrolled with ongoing intermittent claudication Status post bilateral common iliac artery stenting Questionable in-stent restenosis per cardiology note Strongly advised to work on quitting smoking which she is not ready to do. Continue high intensity statin and aspirin   5. Bruit of left carotid artery - US Carotid Duplex Bilateral; Future  6. Need for hepatitis C screening test - HCV Ab w Reflex to Quant PCR  7. Tobacco abuse Smoking cessation support: smoking cessation hotline: 1-800-QUIT-NOW.  Smoking cessation classes are available through [facility]. Call [phone] or visit our website at [email or web address]  Spent 3 minutes counseling on dangers of tobacco use and benefits of quitting, offered pharmacological intervention to aid quitting and patient is not ready to quit.   8. Sciatica of right side Would like to refer for physical therapy however she will check with her job and get back to me if her schedule will allow - tiZANidine (ZANAFLEX) 4 MG tablet; Take 1 tablet (4 mg total) by mouth every 6 (six) hours as needed for muscle spasms.  Dispense: 30 tablet; Refill: 0  9. Muscle spasm Advised that massage will be beneficial Placed on tizanidine    No orders of the defined types were placed in this encounter.   Follow-up: Return in  about 1 month (around 05/27/2021) for Complete physical exam.    46 minutes of total face to face time spent including median intraservice time reviewing previous notes and test results, counseling patient on diagnosis and work up of carotid bruit, in addition to management of chronic medical conditions.Time also spent ordering medications, investigations and documenting in the chart.  All questions were answered to the patient's satisfaction    Robin Rakes, MD, FAAFP. [facility] and [place] [place], Seth Ward   04/26/2021, 1:50 PM

## 2021-04-26 NOTE — Patient Instructions (Signed)
Managing the Challenge of Quitting Smoking ?Quitting smoking is a physical and mental challenge. You will face cravings, withdrawal symptoms, and temptation. Before quitting, work with your health care provider to make a plan that can help you manage quitting. Preparation can help you quit and keep you from giving in. ?How to manage lifestyle changes ?Managing stress ?Stress can make you want to smoke, and wanting to smoke may cause stress. It is important to find ways to manage your stress. You might try some of the following: ?Practice relaxation techniques. ?Breathe slowly and deeply, in through your nose and out through your mouth. ?Listen to music. ?Soak in a bath or take a shower. ?Imagine a peaceful place or vacation. ?Get some support. ?Talk with family or friends about your stress. ?Join a support group. ?Talk with a counselor or therapist. ?Get some physical activity. ?Go for a walk, run, or bike ride. ?Play a favorite sport. ?Practice yoga. ? ?Medicines ?Talk with your health care provider about medicines that might help you deal with cravings and make quitting easier for you. ?Relationships ?Social situations can be difficult when you are quitting smoking. To manage this, you can: ?Avoid parties and other social situations where people might be smoking. ?Avoid alcohol. ?Leave right away if you have the urge to smoke. ?Explain to your family and friends that you are quitting smoking. Ask for support and let them know you might be a bit grumpy. ?Plan activities where smoking is not an option. ?General instructions ?Be aware that many people gain weight after they quit smoking. However, not everyone does. To keep from gaining weight, have a plan in place before you quit and stick to the plan after you quit. Your plan should include: ?Having healthy snacks. When you have a craving, it may help to: ?Eat popcorn, carrots, celery, or other cut vegetables. ?Chew sugar-free gum. ?Changing how you eat. ?Eat small  portion sizes at meals. ?Eat 4-6 small meals throughout the day instead of 1-2 large meals a day. ?Be mindful when you eat. Do not watch television or do other things that might distract you as you eat. ?Exercising regularly. ?Make time to exercise each day. If you do not have time for a long workout, do short bouts of exercise for 5-10 minutes several times a day. ?Do some form of strengthening exercise, such as weight lifting. ?Do some exercise that gets your heart beating and causes you to breathe deeply, such as walking fast, running, swimming, or biking. This is very important. ?Drinking plenty of water or other low-calorie or no-calorie drinks. Drink 6-8 glasses of water daily. ? ?How to recognize withdrawal symptoms ?Your body and mind may experience discomfort as you try to get used to not having nicotine in your system. These effects are called withdrawal symptoms. They may include: ?Feeling hungrier than normal. ?Having trouble concentrating. ?Feeling irritable or restless. ?Having trouble sleeping. ?Feeling depressed. ?Craving a cigarette. ?To manage withdrawal symptoms: ?Avoid places, people, and activities that trigger your cravings. ?Remember why you want to quit. ?Get plenty of sleep. ?Avoid coffee and other caffeinated drinks. These may worsen some of your symptoms. ?These symptoms may surprise you. But be assured that they are normal to have when quitting smoking. ?How to manage cravings ?Come up with a plan for how to deal with your cravings. The plan should include the following: ?A definition of the specific situation you want to deal with. ?An alternative action you will take. ?A clear idea for how this action   will help. ?The name of someone who might help you with this. ?Cravings usually last for 5-10 minutes. Consider taking the following actions to help you with your plan to deal with cravings: ?Keep your mouth busy. ?Chew sugar-free gum. ?Suck on hard candies or a straw. ?Brush your  teeth. ?Keep your hands and body busy. ?Change to a different activity right away. ?Squeeze or play with a ball. ?Do an activity or a hobby, such as making bead jewelry, practicing needlepoint, or working with wood. ?Mix up your normal routine. ?Take a short exercise break. Go for a quick walk or run up and down stairs. ?Focus on doing something kind or helpful for someone else. ?Call a friend or family member to talk during a craving. ?Join a support group. ?Contact a quitline. ?Where to find support ?To get help or find a support group: ?Call the National Cancer Institute's Smoking Quitline: 1-800-QUIT NOW (784-8669) ?Visit the website of the Substance Abuse and Mental Health Services Administration: www.samhsa.gov ?Text QUIT to SmokefreeTXT: 478848 ?Where to find more information ?Visit these websites to find more information on quitting smoking: ?National Cancer Institute: www.smokefree.gov ?American Lung Association: www.lung.org ?American Cancer Society: www.cancer.org ?Centers for Disease Control and Prevention: www.cdc.gov ?American Heart Association: www.heart.org ?Contact a health care provider if: ?You want to change your plan for quitting. ?The medicines you are taking are not helping. ?Your eating feels out of control or you cannot sleep. ?Get help right away if: ?You feel depressed or become very anxious. ?Summary ?Quitting smoking is a physical and mental challenge. You will face cravings, withdrawal symptoms, and temptation to smoke again. Preparation can help you as you go through these challenges. ?Try different techniques to manage stress, handle social situations, and prevent weight gain. ?You can deal with cravings by keeping your mouth busy (such as by chewing gum), keeping your hands and body busy, calling family or friends, or contacting a quitline for people who want to quit smoking. ?You can deal with withdrawal symptoms by avoiding places where people smoke, getting plenty of rest, and  avoiding drinks with caffeine. ?This information is not intended to replace advice given to you by your health care provider. Make sure you discuss any questions you have with your health care provider. ?Document Revised: 12/02/2020 Document Reviewed: 01/13/2019 ?Elsevier Patient Education ? 2022 Elsevier Inc. ? ?

## 2021-04-27 ENCOUNTER — Other Ambulatory Visit: Payer: Self-pay

## 2021-04-27 DIAGNOSIS — R0989 Other specified symptoms and signs involving the circulatory and respiratory systems: Secondary | ICD-10-CM

## 2021-04-27 LAB — CMP14+EGFR
ALT: 8 IU/L (ref 0–32)
AST: 13 IU/L (ref 0–40)
Albumin/Globulin Ratio: 1.9 (ref 1.2–2.2)
Albumin: 4.6 g/dL (ref 3.8–4.9)
Alkaline Phosphatase: 84 IU/L (ref 44–121)
BUN/Creatinine Ratio: 8 — ABNORMAL LOW (ref 9–23)
BUN: 8 mg/dL (ref 6–24)
Bilirubin Total: 0.2 mg/dL (ref 0.0–1.2)
CO2: 23 mmol/L (ref 20–29)
Calcium: 9.5 mg/dL (ref 8.7–10.2)
Chloride: 104 mmol/L (ref 96–106)
Creatinine, Ser: 0.98 mg/dL (ref 0.57–1.00)
Globulin, Total: 2.4 g/dL (ref 1.5–4.5)
Glucose: 100 mg/dL — ABNORMAL HIGH (ref 70–99)
Potassium: 4.5 mmol/L (ref 3.5–5.2)
Sodium: 142 mmol/L (ref 134–144)
Total Protein: 7 g/dL (ref 6.0–8.5)
eGFR: 69 mL/min/{1.73_m2} (ref >59)

## 2021-04-27 LAB — CBC WITH DIFFERENTIAL/PLATELET
Basophils Absolute: 0 10*3/uL (ref 0.0–0.2)
Basos: 0 %
EOS (ABSOLUTE): 0.5 10*3/uL — ABNORMAL HIGH (ref 0.0–0.4)
Eos: 4 %
Hematocrit: 43.3 % (ref 34.0–46.6)
Hemoglobin: 14.8 g/dL (ref 11.1–15.9)
Immature Grans (Abs): 0 10*3/uL (ref 0.0–0.1)
Immature Granulocytes: 0 %
Lymphocytes Absolute: 2.6 10*3/uL (ref 0.7–3.1)
Lymphs: 23 %
MCH: 32.2 pg (ref 26.6–33.0)
MCHC: 34.2 g/dL (ref 31.5–35.7)
MCV: 94 fL (ref 79–97)
Monocytes Absolute: 0.7 10*3/uL (ref 0.1–0.9)
Monocytes: 6 %
Neutrophils Absolute: 7.6 10*3/uL — ABNORMAL HIGH (ref 1.4–7.0)
Neutrophils: 67 %
Platelets: 278 10*3/uL (ref 150–450)
RBC: 4.6 x10E6/uL (ref 3.77–5.28)
RDW: 12.4 % (ref 11.7–15.4)
WBC: 11.4 10*3/uL — ABNORMAL HIGH (ref 3.4–10.8)

## 2021-04-27 LAB — HCV INTERPRETATION

## 2021-04-27 LAB — HCV AB W REFLEX TO QUANT PCR: HCV Ab: <0.1 s/co ratio (ref 0.0–0.9)

## 2021-05-02 ENCOUNTER — Other Ambulatory Visit: Payer: Self-pay

## 2021-05-02 ENCOUNTER — Ambulatory Visit (HOSPITAL_COMMUNITY)
Admission: RE | Admit: 2021-05-02 | Discharge: 2021-05-02 | Disposition: A | Discharge status: Home / Self Care | Payer: 59 | Source: Ambulatory Visit | Attending: Family Medicine | Admitting: Family Medicine

## 2021-05-02 DIAGNOSIS — R0989 Other specified symptoms and signs involving the circulatory and respiratory systems: Secondary | ICD-10-CM

## 2021-05-02 NOTE — Progress Notes (Signed)
Carotid duplex bilateral study completed.   Please see CV Proc for preliminary results.   Sabah Zucco, RDMS, RVT  

## 2021-05-03 ENCOUNTER — Telehealth: Payer: Self-pay

## 2021-05-03 NOTE — Telephone Encounter (Signed)
-----   Message from Charlott Rakes, MD sent at 05/02/2021  3:59 PM EST ----- Please inform her that her preliminary carotid Doppler result does not reveal any evidence of carotid artery blockage.

## 2021-05-03 NOTE — Telephone Encounter (Signed)
Patient was called and a voicemail was left informing patient to return phone call for lab results.   CRM created. That part

## 2021-05-09 ENCOUNTER — Other Ambulatory Visit: Payer: Self-pay

## 2021-05-09 ENCOUNTER — Telehealth: Payer: Self-pay

## 2021-05-09 ENCOUNTER — Other Ambulatory Visit: Payer: Self-pay | Admitting: Family Medicine

## 2021-05-09 ENCOUNTER — Ambulatory Visit: Payer: 59 | Attending: Family Medicine

## 2021-05-09 DIAGNOSIS — Z9861 Coronary angioplasty status: Secondary | ICD-10-CM

## 2021-05-09 DIAGNOSIS — I2510 Atherosclerotic heart disease of native coronary artery without angina pectoris: Secondary | ICD-10-CM

## 2021-05-09 DIAGNOSIS — R350 Frequency of micturition: Secondary | ICD-10-CM

## 2021-05-09 LAB — POCT URINALYSIS DIP (CLINITEK)
Bilirubin, UA: NEGATIVE
Glucose, UA: NEGATIVE mg/dL
Ketones, POC UA: NEGATIVE mg/dL
Nitrite, UA: POSITIVE — AB
POC PROTEIN,UA: NEGATIVE
Spec Grav, UA: >=1.03 — AB (ref 1.010–1.025)
Urobilinogen, UA: 0.2 E.U./dL
pH, UA: 6 (ref 5.0–8.0)

## 2021-05-09 MED ORDER — NITROFURANTOIN MONOHYD MACRO 100 MG PO CAPS
100.0000 mg | ORAL_CAPSULE | Freq: Two times a day (BID) | ORAL | 0 refills | Status: DC
  Filled 2021-05-09: qty 10, 5d supply, fill #0

## 2021-05-09 NOTE — Telephone Encounter (Signed)
Pt's UA results are in chart.

## 2021-05-10 LAB — LP+NON-HDL CHOLESTEROL
Cholesterol, Total: 139 mg/dL (ref 100–199)
HDL: 28 mg/dL — ABNORMAL LOW (ref >39)
LDL Chol Calc (NIH): 71 mg/dL (ref 0–99)
Total Non-HDL-Chol (LDL+VLDL): 111 mg/dL (ref 0–129)
Triglycerides: 246 mg/dL — ABNORMAL HIGH (ref 0–149)
VLDL Cholesterol Cal: 40 mg/dL (ref 5–40)

## 2021-05-29 ENCOUNTER — Other Ambulatory Visit: Payer: Self-pay

## 2021-05-29 ENCOUNTER — Telehealth: Payer: Self-pay

## 2021-05-29 ENCOUNTER — Other Ambulatory Visit (HOSPITAL_COMMUNITY)
Admission: RE | Admit: 2021-05-29 | Discharge: 2021-05-29 | Disposition: A | Discharge status: Home / Self Care | Payer: 59 | Source: Ambulatory Visit | Attending: Family Medicine | Admitting: Family Medicine

## 2021-05-29 ENCOUNTER — Ambulatory Visit: Payer: 59 | Attending: Family Medicine | Admitting: Family Medicine

## 2021-05-29 ENCOUNTER — Encounter: Payer: Self-pay | Admitting: Family Medicine

## 2021-05-29 VITALS — BP 137/77 | HR 90 | Ht 64.0 in | Wt 167.8 lb

## 2021-05-29 DIAGNOSIS — R140 Abdominal distension (gaseous): Secondary | ICD-10-CM | POA: Diagnosis not present

## 2021-05-29 DIAGNOSIS — F17210 Nicotine dependence, cigarettes, uncomplicated: Secondary | ICD-10-CM | POA: Diagnosis not present

## 2021-05-29 DIAGNOSIS — K623 Rectal prolapse: Secondary | ICD-10-CM

## 2021-05-29 DIAGNOSIS — K644 Residual hemorrhoidal skin tags: Secondary | ICD-10-CM

## 2021-05-29 DIAGNOSIS — Z0001 Encounter for general adult medical examination with abnormal findings: Secondary | ICD-10-CM

## 2021-05-29 DIAGNOSIS — Z124 Encounter for screening for malignant neoplasm of cervix: Secondary | ICD-10-CM | POA: Diagnosis present

## 2021-05-29 DIAGNOSIS — K5909 Other constipation: Secondary | ICD-10-CM

## 2021-05-29 DIAGNOSIS — Z0000 Encounter for general adult medical examination without abnormal findings: Secondary | ICD-10-CM

## 2021-05-29 DIAGNOSIS — Z1231 Encounter for screening mammogram for malignant neoplasm of breast: Secondary | ICD-10-CM

## 2021-05-29 MED ORDER — HYDROCORTISONE ACETATE 25 MG RE SUPP
25.0000 mg | Freq: Two times a day (BID) | RECTAL | 1 refills | Status: DC
  Filled 2021-05-29 – 2021-09-07 (×2): qty 30, 15d supply, fill #0

## 2021-05-29 MED ORDER — LUBIPROSTONE 8 MCG PO CAPS
8.0000 ug | ORAL_CAPSULE | Freq: Two times a day (BID) | ORAL | 3 refills | Status: DC
  Filled 2021-05-29: qty 30, 15d supply, fill #0

## 2021-05-29 MED ORDER — LINACLOTIDE 72 MCG PO CAPS
72.0000 ug | ORAL_CAPSULE | Freq: Every day | ORAL | 3 refills | Status: DC
  Filled 2021-05-29 – 2021-09-06 (×3): qty 30, 30d supply, fill #0

## 2021-05-29 NOTE — Telephone Encounter (Signed)
Thanks

## 2021-05-29 NOTE — Progress Notes (Signed)
CPE/PAP

## 2021-05-29 NOTE — Progress Notes (Signed)
Subjective:  Patient ID: Robin Walsh, female    DOB: 12-04-1965  Age: 56 y.o. MRN: [id number]  CC: Annual Exam and Gynecologic Exam   HPI Robin Walsh is a 56 y.o. year old female with a history of tobacco abuse, coronary artery disease (status post PCI and drug-eluting stent placement to LAD in 11/2015) peripheral arterial disease (status post stenting of bilateral common iliac artery) who presents today for her gynecological exam.  Interval History: She has smoked about half a pack to a pack day. She would like to do Chantix to help with cessation but is unable to take it due to side effects.  She is unable to do Wellbutrin or nicotine patches. Due for mammogram and Pap smear today.  Colonoscopy was in 11/2018 and next due in 11/2023.  She does have a history of constipation and complains of abdominal bloating which causes her to strain and sometimes she does not even make a bowel movement.  She has used suppositories in the past in her rectum.  Moves her bowels every third day. Past Medical History:  Diagnosis Date   Anal fissure    Anemia    CAD (coronary artery disease)    Hyperlipidemia    Hypertension     Past Surgical History:  Procedure Laterality Date   ABDOMINAL AORTOGRAM W/LOWER EXTREMITY N/A 01/14/2019   Procedure: ABDOMINAL AORTOGRAM W/LOWER EXTREMITY;  Surgeon: Wellington Hampshire, MD;  Location: [facility];  Service: Cardiovascular;  Laterality: N/A;  limited   CARDIAC CATHETERIZATION N/A 11/28/2015   Procedure: Left Heart Cath and Coronary Angiography;  Surgeon: Jettie Booze, MD;  Location: [facility];  Service: Cardiovascular;  Laterality: N/A;   CARDIAC CATHETERIZATION N/A 11/28/2015   Procedure: Coronary Stent Intervention;  Surgeon: Jettie Booze, MD;  Location: [facility];  Service: Cardiovascular;  Laterality: N/A;  DES to Mid LAD; 3.0x12 Synergy   LEG SURGERY Left 2002?   tibial plateau fracture   PERIPHERAL VASCULAR  BALLOON ANGIOPLASTY Bilateral 01/14/2019   Procedure: PERIPHERAL VASCULAR BALLOON ANGIOPLASTY;  Surgeon: Wellington Hampshire, MD;  Location: [facility];  Service: Cardiovascular;  Laterality: Bilateral;  common iliac   PERIPHERAL VASCULAR CATHETERIZATION N/A 01/04/2016   Procedure: Abdominal Aortogram;  Surgeon: Nelva Bush, MD;  Location: [facility];  Service: Cardiovascular;  Laterality: N/A;   PERIPHERAL VASCULAR CATHETERIZATION Bilateral 01/04/2016   Procedure: Lower Extremity Angiography;  Surgeon: Nelva Bush, MD;  Location: [facility];  Service: Cardiovascular;  Laterality: Bilateral;   PERIPHERAL VASCULAR CATHETERIZATION Bilateral 01/04/2016   Procedure: Peripheral Vascular Intervention;  Surgeon: Nelva Bush, MD;  Location: [facility];  Service: Cardiovascular;  Laterality: Bilateral;  common iliac    Family History  Problem Relation Age of Onset   Congestive Heart Failure Mother 69   Alcohol abuse Father 5   Heart disease Brother    Liver disease Sister        kidney/liver    Allergies  Allergen Reactions   Chantix [Varenicline Tartrate] Nausea Only   Wellbutrin [Bupropion] Itching   Codeine Nausea And Vomiting   Dilaudid [Hydromorphone Hcl] Nausea And Vomiting    Outpatient Medications Prior to Visit  Medication Sig Dispense Refill   acetaminophen (TYLENOL) 500 MG tablet Take 500 mg by mouth every 6 (six) hours as needed for headache (for headaches).      albuterol (VENTOLIN HFA) 108 (90 Base) MCG/ACT inhaler Inhale 1-2 puffs into the lungs  every 6 (six) hours as needed for wheezing or shortness of breath. 1 each 0   aspirin 81 MG chewable tablet Chew 1 tablet (81 mg total) by mouth daily. (Patient taking differently: Chew 81 mg by mouth every evening.) 30 tablet 11   carvedilol (COREG) 3.125 MG tablet Take 1 tablet (3.125 mg total) by mouth 2 (two) times daily with a meal. 180 tablet 0   gabapentin (NEURONTIN) 300 MG capsule Take 2  capsules by mouth in the morning and 3 capsules in the evening 150 capsule 3   lisinopril (ZESTRIL) 5 MG tablet TAKE 1 TABLET (5 MG TOTAL) BY MOUTH DAILY. 90 tablet 0   nitroGLYCERIN (NITROSTAT) 0.4 MG SL tablet Place 1 tablet (0.4 mg total) under the tongue every 5 (five) minutes as needed for chest pain. X 3 doses 25 tablet 1   rosuvastatin (CRESTOR) 40 MG tablet TAKE 1 TABLET (40 MG TOTAL) BY MOUTH DAILY. Must attend upcoming appt prior to further refills. 90 tablet 0   tiZANidine (ZANAFLEX) 4 MG tablet Take 1 tablet (4 mg total) by mouth every 6 (six) hours as needed for muscle spasms. 30 tablet 0   ferrous sulfate 325 (65 FE) MG EC tablet Take 1 tablet (325 mg total) by mouth 2 (two) times daily. 60 tablet 3   nitrofurantoin, macrocrystal-monohydrate, (MACROBID) 100 MG capsule Take 1 capsule (100 mg total) by mouth 2 (two) times daily. (Patient not taking: Reported on 05/29/2021) 10 capsule 0   No facility-administered medications prior to visit.     ROS Review of Systems  Constitutional:  Negative for activity change, appetite change and fatigue.  HENT:  Negative for congestion, sinus pressure and sore throat.   Eyes:  Negative for visual disturbance.  Respiratory:  Negative for cough, chest tightness, shortness of breath and wheezing.   Cardiovascular:  Negative for chest pain and palpitations.  Gastrointestinal:  Positive for constipation. Negative for abdominal distention and abdominal pain.  Endocrine: Negative for polydipsia.  Genitourinary:  Negative for dysuria and frequency.  Musculoskeletal:  Negative for arthralgias and back pain.  Skin:  Negative for rash.  Neurological:  Negative for tremors, light-headedness and numbness.  Hematological:  Does not bruise/bleed easily.  Psychiatric/Behavioral:  Negative for agitation and behavioral problems.    Objective:  BP 137/77    Pulse 90    Ht 5\' 4"  (1.626 m)    Wt 167 lb 12.8 oz (76.1 kg)    LMP 03/26/2017    SpO2 98%    BMI  28.80 kg/m   BP/Weight 05/29/2021 04/26/2021 93/26/7124  Systolic BP 580 998 338  Diastolic BP 77 85 78  Wt. (Lbs) 167.8 167.6 -  BMI 28.8 28.77 -      Physical Exam Exam conducted with a chaperone present.  Constitutional:      General: She is not in acute distress.    Appearance: She is well-developed. She is not diaphoretic.  HENT:     Head: Normocephalic.     Right Ear: External ear normal.     Left Ear: External ear normal.     Nose: Nose normal.  Eyes:     Conjunctiva/sclera: Conjunctivae normal.     Pupils: Pupils are equal, round, and reactive to light.  Neck:     Vascular: No JVD.  Cardiovascular:     Rate and Rhythm: Normal rate and regular rhythm.     Heart sounds: Normal heart sounds. No murmur heard.   No gallop.  Pulmonary:  Effort: Pulmonary effort is normal. No respiratory distress.     Breath sounds: Normal breath sounds. No wheezing or rales.  Chest:     Chest wall: No tenderness.  Breasts:    Right: Normal. No mass, nipple discharge or tenderness.     Left: Normal. No mass, nipple discharge or tenderness.  Abdominal:     General: Bowel sounds are normal. There is no distension.     Palpations: Abdomen is soft. There is no mass.     Tenderness: There is no abdominal tenderness.     Hernia: There is no hernia in the left inguinal area or right inguinal area.  Genitourinary:    General: Normal vulva.     Pubic Area: No rash.      Labia:        Right: No rash.        Left: No rash.      Vagina: Normal.     Cervix: Normal.     Uterus: Normal.      Adnexa: Right adnexa normal and left adnexa normal.       Right: No tenderness.         Left: No tenderness.       Comments: Rectal prolapse with visible mucosa, unable to reduce prolapse. Musculoskeletal:        General: No tenderness. Normal range of motion.     Cervical back: Normal range of motion. No tenderness.     Right lower leg: No edema.     Left lower leg: No edema.  Lymphadenopathy:      Upper Body:     Right upper body: No supraclavicular or axillary adenopathy.     Left upper body: No supraclavicular or axillary adenopathy.  Skin:    General: Skin is warm and dry.  Neurological:     Mental Status: She is alert and oriented to person, place, and time.     Deep Tendon Reflexes: Reflexes are normal and symmetric.  Psychiatric:        Mood and Affect: Mood normal.    CMP Latest Ref Rng & Units 04/26/2021 02/29/2020 05/18/2019  Glucose 70 - 99 mg/dL 100(H) - 90  BUN 6 - 24 mg/dL 8 - 12  Creatinine 0.57 - 1.00 mg/dL 0.98 - 1.03(H)  Sodium 134 - 144 mmol/L 142 - 144  Potassium 3.5 - 5.2 mmol/L 4.5 - 5.4(H)  Chloride 96 - 106 mmol/L 104 - 106  CO2 20 - 29 mmol/L 23 - 21  Calcium 8.7 - 10.2 mg/dL 9.5 - 9.8  Total Protein 6.0 - 8.5 g/dL 7.0 6.8 7.0  Total Bilirubin 0.0 - 1.2 mg/dL 0.2 <0.2 0.3  Alkaline Phos 44 - 121 IU/L 84 59 76  AST 0 - 40 IU/L 13 13 14   ALT 0 - 32 IU/L 8 5 12     Lipid Panel     Component Value Date/Time   CHOL 139 05/09/2021 0835   TRIG 246 (H) 05/09/2021 0835   HDL 28 (L) 05/09/2021 0835   CHOLHDL 4.3 02/29/2020 0851   CHOLHDL 7.8 11/27/2015 0453   VLDL 66 (H) 11/27/2015 0453   LDLCALC 71 05/09/2021 0835    CBC    Component Value Date/Time   WBC 11.4 (H) 04/26/2021 1418   WBC 9.4 10/24/2018 0533   RBC 4.60 04/26/2021 1418   RBC 4.07 10/24/2018 0533   HGB 14.8 04/26/2021 1418   HCT 43.3 04/26/2021 1418   PLT 278 04/26/2021 1418   MCV 94  04/26/2021 1418   MCH 32.2 04/26/2021 1418   MCH 21.4 (L) 10/24/2018 0533   MCHC 34.2 04/26/2021 1418   MCHC 29.3 (L) 10/24/2018 0533   RDW 12.4 04/26/2021 1418   LYMPHSABS 2.6 04/26/2021 1418   MONOABS 0.7 10/23/2018 1406   EOSABS 0.5 (H) 04/26/2021 1418   BASOSABS 0.0 04/26/2021 1418    Lab Results  Component Value Date   HGBA1C 5.8 (H) 11/27/2015    Assessment & Plan:  1. Annual physical exam Counseled on 150 minutes of exercise per week, healthy eating (including decreased daily  intake of saturated fats, cholesterol, added sugars, sodium), STI prevention, routine healthcare maintenance.   2. Encounter for screening mammogram for malignant neoplasm of breast - MM DIGITAL SCREENING BILATERAL; Future  3. Screening for cervical cancer - Cytology - PAP  4. Smoking greater than 20 pack years Spent 3 minutes counseling regarding smoking cessation and hazardous effect of ongoing smoking however she is not ready to quit at this time. - CT CHEST LUNG CANCER SCREENING LOW DOSE WO CONTRAST; Future  5. Partial rectal prolapse Secondary to chronic constipation and straining - Ambulatory referral to General Surgery  6. Other constipation She has used fiber and laxatives and remains constipated - lubiprostone (AMITIZA) 8 MCG capsule; Take 1 capsule (8 mcg total) by mouth 2 (two) times daily with a meal.  Dispense: 30 capsule; Refill: 3  7. Bloating Has been using Gas-X with no relief - Celiac Disease Comprehensive Panel w Reflexes, Infant  8. External hemorrhoids Main goal will be to work in preventing constipation - hydrocortisone (ANUSOL-HC) 25 MG suppository; Place 1 suppository (25 mg total) rectally 2 (two) times daily.  Dispense: 30 suppository; Refill: 1   Meds ordered this encounter  Medications   lubiprostone (AMITIZA) 8 MCG capsule    Sig: Take 1 capsule (8 mcg total) by mouth 2 (two) times daily with a meal.    Dispense:  30 capsule    Refill:  3   hydrocortisone (ANUSOL-HC) 25 MG suppository    Sig: Place 1 suppository (25 mg total) rectally 2 (two) times daily.    Dispense:  30 suppository    Refill:  1    Follow-up: Return in about 6 months (around 11/26/2021) for Chronic medical conditions.       Charlott Rakes, MD, FAAFP. [facility] [place], [place]   05/29/2021, 1:07 PM

## 2021-05-29 NOTE — Patient Instructions (Signed)

## 2021-05-29 NOTE — Telephone Encounter (Signed)
AMITIZA is non-preferred.  Patient must have a history of trial and failure with Linzess or Symproic.  If appropriate, can therapy be changed to Linzess or Symproic?

## 2021-05-29 NOTE — Telephone Encounter (Signed)
PA for Linzess approved until 05/29/2022

## 2021-05-29 NOTE — Telephone Encounter (Signed)
Funny that I previously ordered Linzess but it appeared under epic as not covered so I switched to Amitiza.  Anyway, I have sent a prescription for Linzess to the pharmacy for her.

## 2021-05-30 ENCOUNTER — Other Ambulatory Visit: Payer: Self-pay

## 2021-05-30 LAB — CELIAC DISEASE COMPREHENSIVE PANEL W REFLEXES, INFANT
Antigliadin Abs, IgA: 3 units (ref 0–19)
IgA/Immunoglobulin A, Serum: 179 mg/dL (ref 87–352)
Transglutaminase IgA: <2 U/mL (ref 0–3)

## 2021-05-31 LAB — CYTOLOGY - PAP
Adequacy: ABSENT
Comment: NEGATIVE
Diagnosis: NEGATIVE
High risk HPV: NEGATIVE

## 2021-06-16 ENCOUNTER — Ambulatory Visit (HOSPITAL_COMMUNITY): Payer: 59

## 2021-08-07 ENCOUNTER — Ambulatory Visit (HOSPITAL_COMMUNITY)
Admission: RE | Admit: 2021-08-07 | Payer: 59 | Source: Ambulatory Visit | Attending: Cardiovascular Disease | Admitting: Cardiovascular Disease

## 2021-09-06 ENCOUNTER — Other Ambulatory Visit: Payer: Self-pay

## 2021-09-06 ENCOUNTER — Other Ambulatory Visit: Payer: Self-pay | Admitting: Family Medicine

## 2021-09-06 DIAGNOSIS — I2510 Atherosclerotic heart disease of native coronary artery without angina pectoris: Secondary | ICD-10-CM

## 2021-09-06 MED ORDER — ROSUVASTATIN CALCIUM 40 MG PO TABS
ORAL_TABLET | Freq: Every day | ORAL | 0 refills | Status: DC
  Filled 2021-09-06: qty 30, 30d supply, fill #0
  Filled 2021-10-09: qty 30, 30d supply, fill #1

## 2021-09-07 ENCOUNTER — Other Ambulatory Visit: Payer: Self-pay

## 2021-09-12 ENCOUNTER — Ambulatory Visit (HOSPITAL_BASED_OUTPATIENT_CLINIC_OR_DEPARTMENT_OTHER)
Admission: RE | Admit: 2021-09-12 | Discharge: 2021-09-12 | Disposition: A | Discharge status: Home / Self Care | Payer: 59 | Source: Ambulatory Visit | Attending: Cardiovascular Disease | Admitting: Cardiovascular Disease

## 2021-09-12 ENCOUNTER — Ambulatory Visit (HOSPITAL_COMMUNITY)
Admission: RE | Admit: 2021-09-12 | Discharge: 2021-09-12 | Disposition: A | Discharge status: Home / Self Care | Payer: 59 | Source: Ambulatory Visit | Attending: Cardiovascular Disease | Admitting: Cardiovascular Disease

## 2021-09-12 DIAGNOSIS — Z95828 Presence of other vascular implants and grafts: Secondary | ICD-10-CM

## 2021-09-12 DIAGNOSIS — I739 Peripheral vascular disease, unspecified: Secondary | ICD-10-CM

## 2021-09-19 ENCOUNTER — Other Ambulatory Visit: Payer: Self-pay | Admitting: *Deleted

## 2021-09-19 DIAGNOSIS — I739 Peripheral vascular disease, unspecified: Secondary | ICD-10-CM

## 2021-10-09 ENCOUNTER — Other Ambulatory Visit: Payer: Self-pay

## 2021-11-23 ENCOUNTER — Other Ambulatory Visit: Payer: Self-pay

## 2021-11-24 ENCOUNTER — Other Ambulatory Visit: Payer: Self-pay

## 2021-11-27 ENCOUNTER — Other Ambulatory Visit: Payer: Self-pay

## 2021-11-27 ENCOUNTER — Encounter: Payer: Self-pay | Admitting: Family Medicine

## 2021-11-27 ENCOUNTER — Ambulatory Visit: Payer: 59 | Attending: Family Medicine | Admitting: Family Medicine

## 2021-11-27 VITALS — BP 129/77 | HR 83 | Temp 98.1°F | Ht 64.0 in | Wt 167.6 lb

## 2021-11-27 DIAGNOSIS — N951 Menopausal and female climacteric states: Secondary | ICD-10-CM

## 2021-11-27 DIAGNOSIS — Z9861 Coronary angioplasty status: Secondary | ICD-10-CM

## 2021-11-27 DIAGNOSIS — R42 Dizziness and giddiness: Secondary | ICD-10-CM

## 2021-11-27 DIAGNOSIS — Z720 Tobacco use: Secondary | ICD-10-CM

## 2021-11-27 DIAGNOSIS — I739 Peripheral vascular disease, unspecified: Secondary | ICD-10-CM

## 2021-11-27 DIAGNOSIS — I2510 Atherosclerotic heart disease of native coronary artery without angina pectoris: Secondary | ICD-10-CM

## 2021-11-27 DIAGNOSIS — I10 Essential (primary) hypertension: Secondary | ICD-10-CM | POA: Diagnosis not present

## 2021-11-27 MED ORDER — ROSUVASTATIN CALCIUM 40 MG PO TABS
ORAL_TABLET | Freq: Every day | ORAL | 1 refills | Status: DC
  Filled 2021-11-27: qty 30, 30d supply, fill #0
  Filled 2022-01-31: qty 90, 90d supply, fill #0

## 2021-11-27 MED ORDER — LISINOPRIL 5 MG PO TABS
ORAL_TABLET | Freq: Every day | ORAL | 1 refills | Status: DC
  Filled 2021-11-27: qty 30, 30d supply, fill #0
  Filled 2022-01-31: qty 90, 90d supply, fill #0
  Filled 2022-07-10: qty 90, 90d supply, fill #1

## 2021-11-27 MED ORDER — GABAPENTIN 300 MG PO CAPS
ORAL_CAPSULE | ORAL | 3 refills | Status: DC
  Filled 2021-11-27: qty 150, fill #0
  Filled 2022-01-03: qty 150, 30d supply, fill #0
  Filled 2022-01-31: qty 150, 30d supply, fill #1
  Filled 2022-03-07: qty 150, 30d supply, fill #2
  Filled 2022-04-10: qty 150, 30d supply, fill #3

## 2021-11-27 MED ORDER — MECLIZINE HCL 25 MG PO TABS
25.0000 mg | ORAL_TABLET | Freq: Three times a day (TID) | ORAL | 1 refills | Status: DC | PRN
  Filled 2021-11-27: qty 60, 20d supply, fill #0

## 2021-11-27 MED ORDER — CARVEDILOL 3.125 MG PO TABS
3.1250 mg | ORAL_TABLET | Freq: Two times a day (BID) | ORAL | 1 refills | Status: DC
  Filled 2021-11-27: qty 60, 30d supply, fill #0
  Filled 2022-01-31: qty 180, 90d supply, fill #0
  Filled 2022-07-10: qty 180, 90d supply, fill #1

## 2021-11-27 MED ORDER — ALBUTEROL SULFATE HFA 108 (90 BASE) MCG/ACT IN AERS
1.0000 | INHALATION_SPRAY | Freq: Four times a day (QID) | RESPIRATORY_TRACT | 0 refills | Status: DC | PRN
  Filled 2021-11-27: qty 8.5, 25d supply, fill #0

## 2021-11-27 MED ORDER — NITROGLYCERIN 0.4 MG SL SUBL
0.4000 mg | SUBLINGUAL_TABLET | SUBLINGUAL | 1 refills | Status: AC | PRN
  Filled 2021-11-27: qty 25, 25d supply, fill #0

## 2021-11-27 NOTE — Progress Notes (Signed)
Subjective:  Patient ID: Robin Walsh, female    DOB: February 29, 1966  Age: 56 y.o. MRN: [id number]  CC: Hypertension   HPI Robin Walsh is a 56 y.o. year old female with a history of tobacco abuse, coronary artery disease (status post PCI and drug-eluting stent placement to LAD in 11/2015) peripheral arterial disease (status post stenting of bilateral common iliac artery).  Interval History:  Linzess caused her to have to have a urgency when she had to move her bowels.  Sometimes she would pass gas and this would be followed by moving her bowels hence she discontinued using it regularly and only uses sparingly.  She increased her smoking after she lost her job 2 months ago. She smokes less than half a ppd. Chantix makes her bloated.  She Complains of vertigo with change of position and has to tilt her head to the lateral position for relief. She is starting to have claudication pains in her legs and it hurts to walk a long distance.She previously received a handicap form from her vascular doctor and is requesting one today as she will be unable to see him prior to her insurance running out. Endorses adherence with her statin, aspirin.  Denies presence of chest pain but would like to have a refill of nitroglycerin in the event that she has chest pain. She states she will be losing her insurance at the end of the month but is working on getting something else.  Past Medical History:  Diagnosis Date   Anal fissure    Anemia    CAD (coronary artery disease)    Hyperlipidemia    Hypertension     Past Surgical History:  Procedure Laterality Date   ABDOMINAL AORTOGRAM W/LOWER EXTREMITY N/A 01/14/2019   Procedure: ABDOMINAL AORTOGRAM W/LOWER EXTREMITY;  Surgeon: Wellington Hampshire, MD;  Location: [facility];  Service: Cardiovascular;  Laterality: N/A;  limited   CARDIAC CATHETERIZATION N/A 11/28/2015   Procedure: Left Heart Cath and Coronary Angiography;  Surgeon: Jettie Booze, MD;  Location: [facility];  Service: Cardiovascular;  Laterality: N/A;   CARDIAC CATHETERIZATION N/A 11/28/2015   Procedure: Coronary Stent Intervention;  Surgeon: Jettie Booze, MD;  Location: [facility];  Service: Cardiovascular;  Laterality: N/A;  DES to Mid LAD; 3.0x12 Synergy   LEG SURGERY Left 2002?   tibial plateau fracture   PERIPHERAL VASCULAR BALLOON ANGIOPLASTY Bilateral 01/14/2019   Procedure: PERIPHERAL VASCULAR BALLOON ANGIOPLASTY;  Surgeon: Wellington Hampshire, MD;  Location: [facility];  Service: Cardiovascular;  Laterality: Bilateral;  common iliac   PERIPHERAL VASCULAR CATHETERIZATION N/A 01/04/2016   Procedure: Abdominal Aortogram;  Surgeon: Nelva Bush, MD;  Location: [facility];  Service: Cardiovascular;  Laterality: N/A;   PERIPHERAL VASCULAR CATHETERIZATION Bilateral 01/04/2016   Procedure: Lower Extremity Angiography;  Surgeon: Nelva Bush, MD;  Location: [facility];  Service: Cardiovascular;  Laterality: Bilateral;   PERIPHERAL VASCULAR CATHETERIZATION Bilateral 01/04/2016   Procedure: Peripheral Vascular Intervention;  Surgeon: Nelva Bush, MD;  Location: [facility];  Service: Cardiovascular;  Laterality: Bilateral;  common iliac    Family History  Problem Relation Age of Onset   Congestive Heart Failure Mother 84   Alcohol abuse Father 60   Heart disease Brother    Liver disease Sister        kidney/liver    Social History   Socioeconomic History   Marital status: Single    Spouse  name: Not on file   Number of children: 0   Years of education: Not on file   Highest education level: Not on file  Occupational History   Occupation: clerical work  Tobacco Use   Smoking status: Every Day    Packs/day: 0.50    Years: 25.00    Total pack years: 12.50    Types: Cigarettes   Smokeless tobacco: Never  Vaping Use   Vaping Use: Never used  Substance and Sexual Activity   Alcohol use: No    Drug use: Yes    Types: Marijuana    Comment: one week ago   Sexual activity: Not on file  Other Topics Concern   Not on file  Social History Narrative   Not on file   Social Determinants of Health   Financial Resource Strain: Not on file  Food Insecurity: Not on file  Transportation Needs: Not on file  Physical Activity: Not on file  Stress: Not on file  Social Connections: Not on file    Allergies  Allergen Reactions   Chantix [Varenicline Tartrate] Nausea Only   Wellbutrin [Bupropion] Itching   Codeine Nausea And Vomiting   Dilaudid [Hydromorphone Hcl] Nausea And Vomiting    Outpatient Medications Prior to Visit  Medication Sig Dispense Refill   acetaminophen (TYLENOL) 500 MG tablet Take 500 mg by mouth every 6 (six) hours as needed for headache (for headaches).      aspirin 81 MG chewable tablet Chew 1 tablet (81 mg total) by mouth daily. (Patient taking differently: Chew 81 mg by mouth every evening.) 30 tablet 11   tiZANidine (ZANAFLEX) 4 MG tablet Take 1 tablet (4 mg total) by mouth every 6 (six) hours as needed for muscle spasms. 30 tablet 0   albuterol (VENTOLIN HFA) 108 (90 Base) MCG/ACT inhaler Inhale 1-2 puffs into the lungs every 6 (six) hours as needed for wheezing or shortness of breath. 1 each 0   gabapentin (NEURONTIN) 300 MG capsule Take 2 capsules by mouth in the morning and 3 capsules in the evening 150 capsule 3   lisinopril (ZESTRIL) 5 MG tablet TAKE 1 TABLET (5 MG TOTAL) BY MOUTH DAILY. 90 tablet 0   nitroGLYCERIN (NITROSTAT) 0.4 MG SL tablet Place 1 tablet (0.4 mg total) under the tongue every 5 (five) minutes as needed for chest pain. X 3 doses 25 tablet 1   rosuvastatin (CRESTOR) 40 MG tablet TAKE 1 TABLET (40 MG TOTAL) BY MOUTH DAILY. Must attend upcoming appt prior to further refills. 90 tablet 0   ferrous sulfate 325 (65 FE) MG EC tablet Take 1 tablet (325 mg total) by mouth 2 (two) times daily. 60 tablet 3   carvedilol (COREG) 3.125 MG tablet Take 1  tablet (3.125 mg total) by mouth 2 (two) times daily with a meal. 180 tablet 0   hydrocortisone (ANUSOL-HC) 25 MG suppository Place 1 suppository (25 mg total) rectally 2 (two) times daily. (Patient not taking: Reported on 11/27/2021) 30 suppository 1   linaclotide (LINZESS) 72 MCG capsule Take 1 capsule (72 mcg total) by mouth daily before breakfast. (Patient not taking: Reported on 11/27/2021) 30 capsule 3   nitrofurantoin, macrocrystal-monohydrate, (MACROBID) 100 MG capsule Take 1 capsule (100 mg total) by mouth 2 (two) times daily. (Patient not taking: Reported on 05/29/2021) 10 capsule 0   No facility-administered medications prior to visit.     ROS Review of Systems  Constitutional:  Negative for activity change and appetite change.  HENT:  Negative for  sinus pressure and sore throat.   Respiratory:  Negative for chest tightness, shortness of breath and wheezing.   Cardiovascular:  Negative for chest pain and palpitations.  Gastrointestinal:  Negative for abdominal distention, abdominal pain and constipation.  Genitourinary: Negative.   Musculoskeletal:        Leg pains  Psychiatric/Behavioral:  Negative for behavioral problems and dysphoric mood.     Objective:  BP 129/77   Pulse 83   Temp 98.1 F (36.7 C) (Oral)   Ht '5\' 4"'$  (1.626 m)   Wt 167 lb 9.6 oz (76 kg)   LMP 03/26/2017   SpO2 99%   BMI 28.77 kg/m      11/27/2021    8:38 AM 05/29/2021   10:33 AM 04/26/2021    1:32 PM  BP/Weight  Systolic BP 696 295 284  Diastolic BP 77 77 85  Wt. (Lbs) 167.6 167.8 167.6  BMI 28.77 kg/m2 28.8 kg/m2 28.77 kg/m2      Physical Exam Constitutional:      Appearance: She is well-developed.  Cardiovascular:     Rate and Rhythm: Normal rate.     Heart sounds: Normal heart sounds. No murmur heard. Pulmonary:     Effort: Pulmonary effort is normal.     Breath sounds: Normal breath sounds. No wheezing or rales.  Chest:     Chest wall: No tenderness.  Abdominal:     General:  Bowel sounds are normal. There is no distension.     Palpations: Abdomen is soft. There is no mass.     Tenderness: There is no abdominal tenderness.  Musculoskeletal:        General: Normal range of motion.     Right lower leg: No edema.     Left lower leg: No edema.  Neurological:     Mental Status: She is alert and oriented to person, place, and time.  Psychiatric:        Mood and Affect: Mood normal.        Latest Ref Rng & Units 04/26/2021    2:18 PM 02/29/2020    8:51 AM 05/18/2019    8:49 AM  CMP  Glucose 70 - 99 mg/dL 100   90   BUN 6 - 24 mg/dL 8   12   Creatinine 0.57 - 1.00 mg/dL 0.98   1.03   Sodium 134 - 144 mmol/L 142   144   Potassium 3.5 - 5.2 mmol/L 4.5   5.4   Chloride 96 - 106 mmol/L 104   106   CO2 20 - 29 mmol/L 23   21   Calcium 8.7 - 10.2 mg/dL 9.5   9.8   Total Protein 6.0 - 8.5 g/dL 7.0  6.8  7.0   Total Bilirubin 0.0 - 1.2 mg/dL 0.2  <0.2  0.3   Alkaline Phos 44 - 121 IU/L 84  59  76   AST 0 - 40 IU/L '13  13  14   '$ ALT 0 - 32 IU/L '8  5  12     '$ Lipid Panel     Component Value Date/Time   CHOL 139 05/09/2021 0835   TRIG 246 (H) 05/09/2021 0835   HDL 28 (L) 05/09/2021 0835   CHOLHDL 4.3 02/29/2020 0851   CHOLHDL 7.8 11/27/2015 0453   VLDL 66 (H) 11/27/2015 0453   LDLCALC 71 05/09/2021 0835    CBC    Component Value Date/Time   WBC 11.4 (H) 04/26/2021 1418   WBC 9.4 10/24/2018  0533   RBC 4.60 04/26/2021 1418   RBC 4.07 10/24/2018 0533   HGB 14.8 04/26/2021 1418   HCT 43.3 04/26/2021 1418   PLT 278 04/26/2021 1418   MCV 94 04/26/2021 1418   MCH 32.2 04/26/2021 1418   MCH 21.4 (L) 10/24/2018 0533   MCHC 34.2 04/26/2021 1418   MCHC 29.3 (L) 10/24/2018 0533   RDW 12.4 04/26/2021 1418   LYMPHSABS 2.6 04/26/2021 1418   MONOABS 0.7 10/23/2018 1406   EOSABS 0.5 (H) 04/26/2021 1418   BASOSABS 0.0 04/26/2021 1418    Lab Results  Component Value Date   HGBA1C 5.8 (H) 11/27/2015    Assessment & Plan:  1. Essential  hypertension Controlled Continue current regimen Counseled on blood pressure goal of less than 130/80, low-sodium, DASH diet, medication compliance, 150 minutes of moderate intensity exercise per week. Discussed medication compliance, adverse effects. - Basic Metabolic Panel - carvedilol (COREG) 3.125 MG tablet; Take 1 tablet (3.125 mg total) by mouth 2 (two) times daily with a meal.  Dispense: 180 tablet; Refill: 1 - lisinopril (ZESTRIL) 5 MG tablet; TAKE 1 TABLET (5 MG TOTAL) BY MOUTH DAILY.  Dispense: 90 tablet; Refill: 1  2. Menopausal vasomotor syndrome Stable - gabapentin (NEURONTIN) 300 MG capsule; Take 2 capsules by mouth in the morning and 3 capsules in the evening  Dispense: 150 capsule; Refill: 3  3. CAD S/P DES PCI TO mLAD - Synergy DES 3.0 mm x 12 mm Asymptomatic Continue risk factor modification including smoking cessation - nitroGLYCERIN (NITROSTAT) 0.4 MG SL tablet; Place 1 tablet (0.4 mg total) under the tongue every 5 (five) minutes as needed for chest pain. X 3 doses  Dispense: 25 tablet; Refill: 1 - rosuvastatin (CRESTOR) 40 MG tablet; TAKE 1 TABLET (40 MG TOTAL) BY MOUTH DAILY. Must attend upcoming appt prior to further refills.  Dispense: 90 tablet; Refill: 1  4. PAD (peripheral artery disease) ([place]) Uncontrolled with claudication pain Provided handicap form for 48-month and advise she needs to see her vascular doctor if she needs this renewed  5. Tobacco abuse Spent 3 minutes counseling on cessation She did not do well with Chantix in the past Current unemployment status contributing She will be working on quitting Consider low-dose chest CT at next visit  6. Vertigo Trial of meclizine If uncontrolled consider vestibular rehab - meclizine (ANTIVERT) 25 MG tablet; Take 1 tablet (25 mg total) by mouth 3 (three) times daily as needed for dizziness.  Dispense: 60 tablet; Refill: 1   Health Care Maintenance: We will need to order lung cancer screening however her  insurance will be running out of the end of this month hence we will order at her next visit. Meds ordered this encounter  Medications   carvedilol (COREG) 3.125 MG tablet    Sig: Take 1 tablet (3.125 mg total) by mouth 2 (two) times daily with a meal.    Dispense:  180 tablet    Refill:  1   gabapentin (NEURONTIN) 300 MG capsule    Sig: Take 2 capsules by mouth in the morning and 3 capsules in the evening    Dispense:  150 capsule    Refill:  3   lisinopril (ZESTRIL) 5 MG tablet    Sig: TAKE 1 TABLET (5 MG TOTAL) BY MOUTH DAILY.    Dispense:  90 tablet    Refill:  1   nitroGLYCERIN (NITROSTAT) 0.4 MG SL tablet    Sig: Place 1 tablet (0.4 mg total) under the tongue  every 5 (five) minutes as needed for chest pain. X 3 doses    Dispense:  25 tablet    Refill:  1   rosuvastatin (CRESTOR) 40 MG tablet    Sig: TAKE 1 TABLET (40 MG TOTAL) BY MOUTH DAILY. Must attend upcoming appt prior to further refills.    Dispense:  90 tablet    Refill:  1   albuterol (VENTOLIN HFA) 108 (90 Base) MCG/ACT inhaler    Sig: Inhale 1-2 puffs into the lungs every 6 (six) hours as needed for wheezing or shortness of breath.    Dispense:  1 each    Refill:  0    Follow-up: Return in about 6 months (around 05/30/2022) for Chronic medical conditions.       Charlott Rakes, MD, FAAFP. [facility] and [place], [place]   11/27/2021, 9:18 AM

## 2021-11-27 NOTE — Patient Instructions (Signed)

## 2021-11-28 LAB — BASIC METABOLIC PANEL
BUN/Creatinine Ratio: 10 (ref 9–23)
BUN: 12 mg/dL (ref 6–24)
CO2: 23 mmol/L (ref 20–29)
Calcium: 10 mg/dL (ref 8.7–10.2)
Chloride: 104 mmol/L (ref 96–106)
Creatinine, Ser: 1.19 mg/dL — ABNORMAL HIGH (ref 0.57–1.00)
Glucose: 91 mg/dL (ref 70–99)
Potassium: 5 mmol/L (ref 3.5–5.2)
Sodium: 141 mmol/L (ref 134–144)
eGFR: 54 mL/min/{1.73_m2} — ABNORMAL LOW (ref >59)

## 2021-12-04 ENCOUNTER — Other Ambulatory Visit: Payer: Self-pay

## 2022-01-03 ENCOUNTER — Other Ambulatory Visit: Payer: Self-pay

## 2022-01-31 ENCOUNTER — Other Ambulatory Visit: Payer: Self-pay

## 2022-03-07 ENCOUNTER — Other Ambulatory Visit: Payer: Self-pay

## 2022-04-10 ENCOUNTER — Other Ambulatory Visit: Payer: Self-pay

## 2022-05-14 ENCOUNTER — Other Ambulatory Visit: Payer: Self-pay

## 2022-05-14 ENCOUNTER — Other Ambulatory Visit: Payer: Self-pay | Admitting: Family Medicine

## 2022-05-14 ENCOUNTER — Encounter: Payer: Self-pay | Admitting: Family Medicine

## 2022-05-14 DIAGNOSIS — N951 Menopausal and female climacteric states: Secondary | ICD-10-CM

## 2022-05-14 MED ORDER — GABAPENTIN 300 MG PO CAPS
ORAL_CAPSULE | ORAL | 1 refills | Status: DC
  Filled 2022-05-14: qty 150, fill #0

## 2022-05-14 MED ORDER — GABAPENTIN 300 MG PO CAPS
ORAL_CAPSULE | ORAL | 1 refills | Status: DC
  Filled 2022-05-14: qty 150, 30d supply, fill #0
  Filled 2022-07-10: qty 150, 30d supply, fill #1

## 2022-05-30 ENCOUNTER — Other Ambulatory Visit: Payer: Self-pay

## 2022-05-30 ENCOUNTER — Ambulatory Visit: Payer: 59 | Attending: Family Medicine | Admitting: Family Medicine

## 2022-05-30 ENCOUNTER — Encounter: Payer: Self-pay | Admitting: Family Medicine

## 2022-05-30 VITALS — BP 118/75 | HR 94 | Temp 98.2°F | Ht 64.0 in | Wt 170.4 lb

## 2022-05-30 DIAGNOSIS — Z1231 Encounter for screening mammogram for malignant neoplasm of breast: Secondary | ICD-10-CM

## 2022-05-30 DIAGNOSIS — I739 Peripheral vascular disease, unspecified: Secondary | ICD-10-CM

## 2022-05-30 DIAGNOSIS — F17210 Nicotine dependence, cigarettes, uncomplicated: Secondary | ICD-10-CM

## 2022-05-30 DIAGNOSIS — I2510 Atherosclerotic heart disease of native coronary artery without angina pectoris: Secondary | ICD-10-CM

## 2022-05-30 DIAGNOSIS — N951 Menopausal and female climacteric states: Secondary | ICD-10-CM

## 2022-05-30 DIAGNOSIS — I10 Essential (primary) hypertension: Secondary | ICD-10-CM | POA: Diagnosis not present

## 2022-05-30 DIAGNOSIS — F330 Major depressive disorder, recurrent, mild: Secondary | ICD-10-CM

## 2022-05-30 DIAGNOSIS — Z9861 Coronary angioplasty status: Secondary | ICD-10-CM

## 2022-05-30 DIAGNOSIS — R42 Dizziness and giddiness: Secondary | ICD-10-CM

## 2022-05-30 MED ORDER — DULOXETINE HCL 60 MG PO CPEP
60.0000 mg | ORAL_CAPSULE | Freq: Every day | ORAL | 3 refills | Status: DC
  Filled 2022-05-30: qty 30, 30d supply, fill #0
  Filled 2022-07-10: qty 30, 30d supply, fill #1
  Filled 2022-08-07: qty 30, 30d supply, fill #2
  Filled 2022-09-05: qty 30, 30d supply, fill #3

## 2022-05-30 MED ORDER — VEOZAH 45 MG PO TABS
1.0000 | ORAL_TABLET | Freq: Every day | ORAL | 1 refills | Status: DC
  Filled 2022-05-30: qty 30, 30d supply, fill #0
  Filled 2022-07-10: qty 30, 30d supply, fill #1
  Filled 2022-08-07: qty 30, 30d supply, fill #2
  Filled 2022-09-05: qty 30, 30d supply, fill #3
  Filled 2022-10-08: qty 30, 30d supply, fill #4

## 2022-05-30 MED ORDER — ROSUVASTATIN CALCIUM 40 MG PO TABS
40.0000 mg | ORAL_TABLET | Freq: Every day | ORAL | 1 refills | Status: DC
  Filled 2022-05-30: qty 30, 30d supply, fill #0
  Filled 2022-05-31 – 2022-07-10 (×2): qty 90, 90d supply, fill #0
  Filled 2022-08-07 – 2022-10-08 (×2): qty 90, 90d supply, fill #1

## 2022-05-30 NOTE — Patient Instructions (Signed)
Managing Hot Flashes During Menopause You will learn what hot flashes/night sweats are, what causes them and what the treatment methods are. To view the content, go to this web address: [email or web address]  This video will expire on: 12/13/2023. If you need access to this video following this date, please reach out to the healthcare provider who assigned it to you. This information is not intended to replace advice given to you by your health care provider. Make sure you discuss any questions you have with your health care provider. [employer] Patient Education  [place].

## 2022-05-30 NOTE — Progress Notes (Signed)
Subjective:  Patient ID: Robin Walsh, female    DOB: [birth year]  Age: 57 y.o. MRN: [id number]  CC: Hypertension   HPI Robin Walsh is a 57 y.o. year old female with a history of tobacco abuse ( I ppd since the age of 37), coronary artery disease (status post PCI and drug-eluting stent placement to LAD in 11/2015) peripheral arterial disease (status post stenting of bilateral common iliac artery).   Interval History:  She still has claudication pain which occurs when she walks and is relieved when she sits. She has no pedal edema, color change.  She has not been to see her vascular surgeon in a while due to the fact that she lost her insurance but now she has gotten her another insurance and she plans to schedule an appointment. Smokes cigarettes and is not yet ready to quit.  In the past she tried Wellbutrin and did not like how it made her feel. Denies presence of chest pain or dyspnea. Endorses adherence with her chronic medications.   She is under stress but has enough energy to do the things she needs to do like go to work but when she gets home she does not feel like doing anything. She has anhedonia, low energy.  Denies suicidal ideations and intent. Tried Effexor in the past but this was not effective.  Her hot flashes are so bothersome and she has had insomnia as well. Goes to bed at 2am and 6am.  Gabapentin was initiated for hot flashes as well as her neuropathy but it has not helped her hot flashes. Past Medical History:  Diagnosis Date   Anal fissure    Anemia    CAD (coronary artery disease)    Hyperlipidemia    Hypertension     Past Surgical History:  Procedure Laterality Date   ABDOMINAL AORTOGRAM W/LOWER EXTREMITY N/A 01/14/2019   Procedure: ABDOMINAL AORTOGRAM W/LOWER EXTREMITY;  Surgeon: Wellington Hampshire, MD;  Location: [facility];  Service: Cardiovascular;  Laterality: N/A;  limited   CARDIAC CATHETERIZATION N/A 11/28/2015   Procedure: Left Heart  Cath and Coronary Angiography;  Surgeon: Jettie Booze, MD;  Location: [facility];  Service: Cardiovascular;  Laterality: N/A;   CARDIAC CATHETERIZATION N/A 11/28/2015   Procedure: Coronary Stent Intervention;  Surgeon: Jettie Booze, MD;  Location: [facility];  Service: Cardiovascular;  Laterality: N/A;  DES to Mid LAD; 3.0x12 Synergy   LEG SURGERY Left 2002?   tibial plateau fracture   PERIPHERAL VASCULAR BALLOON ANGIOPLASTY Bilateral 01/14/2019   Procedure: PERIPHERAL VASCULAR BALLOON ANGIOPLASTY;  Surgeon: Wellington Hampshire, MD;  Location: [facility];  Service: Cardiovascular;  Laterality: Bilateral;  common iliac   PERIPHERAL VASCULAR CATHETERIZATION N/A 01/04/2016   Procedure: Abdominal Aortogram;  Surgeon: Nelva Bush, MD;  Location: [facility];  Service: Cardiovascular;  Laterality: N/A;   PERIPHERAL VASCULAR CATHETERIZATION Bilateral 01/04/2016   Procedure: Lower Extremity Angiography;  Surgeon: Nelva Bush, MD;  Location: [facility];  Service: Cardiovascular;  Laterality: Bilateral;   PERIPHERAL VASCULAR CATHETERIZATION Bilateral 01/04/2016   Procedure: Peripheral Vascular Intervention;  Surgeon: Nelva Bush, MD;  Location: [facility];  Service: Cardiovascular;  Laterality: Bilateral;  common iliac    Family History  Problem Relation Age of Onset   Congestive Heart Failure Mother 49   Alcohol abuse Father 82   Heart disease Brother    Liver disease Sister  kidney/liver    Social History   Socioeconomic History   Marital status: Single    Spouse name: Not on file   Number of children: 0   Years of education: Not on file   Highest education level: Not on file  Occupational History   Occupation: clerical work  Tobacco Use   Smoking status: Every Day    Packs/day: 0.50    Years: 25.00    Total pack years: 12.50    Types: Cigarettes   Smokeless tobacco: Never  Vaping Use   Vaping Use: Never used   Substance and Sexual Activity   Alcohol use: No   Drug use: Yes    Types: Marijuana    Comment: one week ago   Sexual activity: Not on file  Other Topics Concern   Not on file  Social History Narrative   Not on file   Social Determinants of Health   Financial Resource Strain: Not on file  Food Insecurity: Not on file  Transportation Needs: Not on file  Physical Activity: Not on file  Stress: Not on file  Social Connections: Not on file    Allergies  Allergen Reactions   Chantix [Varenicline Tartrate] Nausea Only   Wellbutrin [Bupropion] Itching   Codeine Nausea And Vomiting   Dilaudid [Hydromorphone Hcl] Nausea And Vomiting    Outpatient Medications Prior to Visit  Medication Sig Dispense Refill   albuterol (VENTOLIN HFA) 108 (90 Base) MCG/ACT inhaler Inhale 1-2 puffs into the lungs every 6 (six) hours as needed for wheezing or shortness of breath. 8.5 g 0   aspirin 81 MG chewable tablet Chew 1 tablet (81 mg total) by mouth daily. (Patient taking differently: Chew 81 mg by mouth every evening.) 30 tablet 11   gabapentin (NEURONTIN) 300 MG capsule Take 2 capsules by mouth in the morning and 3 capsules in the evening 150 capsule 1   lisinopril (ZESTRIL) 5 MG tablet TAKE 1 TABLET (5 MG TOTAL) BY MOUTH DAILY. 90 tablet 1   rosuvastatin (CRESTOR) 40 MG tablet TAKE 1 TABLET (40 MG TOTAL) BY MOUTH DAILY. Must attend upcoming appt prior to further refills. 90 tablet 1   acetaminophen (TYLENOL) 500 MG tablet Take 500 mg by mouth every 6 (six) hours as needed for headache (for headaches).  (Patient not taking: Reported on 05/30/2022)     carvedilol (COREG) 3.125 MG tablet Take 1 tablet (3.125 mg total) by mouth 2 (two) times daily with a meal. 180 tablet 1   ferrous sulfate 325 (65 FE) MG EC tablet Take 1 tablet (325 mg total) by mouth 2 (two) times daily. 60 tablet 3   meclizine (ANTIVERT) 25 MG tablet Take 1 tablet (25 mg total) by mouth 3 (three) times daily as needed for dizziness.  (Patient not taking: Reported on 05/30/2022) 60 tablet 1   nitroGLYCERIN (NITROSTAT) 0.4 MG SL tablet Place 1 tablet (0.4 mg total) under the tongue every 5 (five) minutes as needed for chest pain. X 3 doses (Patient not taking: Reported on 05/30/2022) 25 tablet 1   tiZANidine (ZANAFLEX) 4 MG tablet Take 1 tablet (4 mg total) by mouth every 6 (six) hours as needed for muscle spasms. (Patient not taking: Reported on 05/30/2022) 30 tablet 0   No facility-administered medications prior to visit.     ROS Review of Systems  Constitutional:  Negative for activity change and appetite change.  HENT:  Negative for sinus pressure and sore throat.   Respiratory:  Negative for chest tightness, shortness  of breath and wheezing.   Cardiovascular:  Negative for chest pain and palpitations.  Gastrointestinal:  Negative for abdominal distention, abdominal pain and constipation.  Genitourinary: Negative.   Musculoskeletal: Negative.   Psychiatric/Behavioral:  Negative for behavioral problems and dysphoric mood.     Objective:  BP 118/75   Pulse 94   Temp 98.2 F (36.8 C) (Oral)   Ht 5' 4"$  (1.626 m)   Wt 170 lb 6.4 oz (77.3 kg)   LMP 03/26/2017   SpO2 99%   BMI 29.25 kg/m      05/30/2022   11:32 AM 05/30/2022   11:31 AM 05/30/2022    9:18 AM  BP/Weight  Systolic BP [id number]  Diastolic BP 87 93 75  Wt. (Lbs)   170.4  BMI   29.25 kg/m2      Physical Exam Constitutional:      Appearance: She is well-developed.  Cardiovascular:     Rate and Rhythm: Normal rate.     Heart sounds: Normal heart sounds. No murmur heard. Pulmonary:     Effort: Pulmonary effort is normal.     Breath sounds: Normal breath sounds. No wheezing or rales.  Chest:     Chest wall: No tenderness.  Abdominal:     General: Bowel sounds are normal. There is no distension.     Palpations: Abdomen is soft. There is no mass.     Tenderness: There is no abdominal tenderness.  Musculoskeletal:        General: Normal  range of motion.     Right lower leg: No edema.     Left lower leg: No edema.  Neurological:     Mental Status: She is alert and oriented to person, place, and time.  Psychiatric:        Mood and Affect: Mood normal.        Latest Ref Rng & Units 11/27/2021    9:24 AM 04/26/2021    2:18 PM 02/29/2020    8:51 AM  CMP  Glucose 70 - 99 mg/dL 91  100    BUN 6 - 24 mg/dL 12  8    Creatinine 0.57 - 1.00 mg/dL 1.19  0.98    Sodium 134 - 144 mmol/L 141  142    Potassium 3.5 - 5.2 mmol/L 5.0  4.5    Chloride 96 - 106 mmol/L 104  104    CO2 20 - 29 mmol/L 23  23    Calcium 8.7 - 10.2 mg/dL 10.0  9.5    Total Protein 6.0 - 8.5 g/dL  7.0  6.8   Total Bilirubin 0.0 - 1.2 mg/dL  0.2  <0.2   Alkaline Phos 44 - 121 IU/L  84  59   AST 0 - 40 IU/L  13  13   ALT 0 - 32 IU/L  8  5     Lipid Panel     Component Value Date/Time   CHOL 139 05/09/2021 0835   TRIG 246 (H) 05/09/2021 0835   HDL 28 (L) 05/09/2021 0835   CHOLHDL 4.3 02/29/2020 0851   CHOLHDL 7.8 11/27/2015 0453   VLDL 66 (H) 11/27/2015 0453   LDLCALC 71 05/09/2021 0835    CBC    Component Value Date/Time   WBC 11.4 (H) 04/26/2021 1418   WBC 9.4 10/24/2018 0533   RBC 4.60 04/26/2021 1418   RBC 4.07 10/24/2018 0533   HGB 14.8 04/26/2021 1418   HCT 43.3 04/26/2021 1418   PLT 278  04/26/2021 1418   MCV 94 04/26/2021 1418   MCH 32.2 04/26/2021 1418   MCH 21.4 (L) 10/24/2018 0533   MCHC 34.2 04/26/2021 1418   MCHC 29.3 (L) 10/24/2018 0533   RDW 12.4 04/26/2021 1418   LYMPHSABS 2.6 04/26/2021 1418   MONOABS 0.7 10/23/2018 1406   EOSABS 0.5 (H) 04/26/2021 1418   BASOSABS 0.0 04/26/2021 1418    Lab Results  Component Value Date   HGBA1C 5.8 (H) 11/27/2015    Assessment & Plan:  1. CAD S/P DES PCI TO mLAD - Synergy DES 3.0 mm x 12 mm Symptomatic Risk factor modification including smoking cessation Continue statin - rosuvastatin (CRESTOR) 40 MG tablet; TAKE 1 TABLET (40 MG TOTAL) BY MOUTH DAILY. Must attend upcoming  appt prior to further refills.  Dispense: 90 tablet; Refill: 1  2. Encounter for screening mammogram for malignant neoplasm of breast - MM 3D SCREEN BREAST BILATERAL; Future  3. Smoking greater than 20 pack years Spent 3 minutes counseling on smoking cessation and she is not ready to quit at this time She continues to smoke due to underlying stressors - CT CHEST LUNG CANCER SCREENING LOW DOSE WO CONTRAST; Future  4. Essential hypertension Controlled Continue antihypertensives Counseled on blood pressure goal of less than 130/80, low-sodium, DASH diet, medication compliance, 150 minutes of moderate intensity exercise per week. Discussed medication compliance, adverse effects. - CMP14+EGFR  5. PAD (peripheral artery disease) ([place]) Uncontrolled with intermittent claudication Advised to schedule appointment with her vascular surgeon Counseled the smoking cessation will be beneficial towards reversing her condition Continue statin  6. Vertigo Controlled and she uses meclizine as needed  7. Vasomotor symptoms due to menopause Uncontrolled and gabapentin Will place on Veozah per request - Fezolinetant (VEOZAH) 45 MG TABS; Take 1 tablet (45 mg total) by mouth daily.  Dispense: 90 tablet; Refill: 1  8. Mild episode of recurrent major depressive disorder ([place]) Underlying stressors After shared decision making she is open to initiating Cymbalta which will also be beneficial from a neuropathy and pain standpoint LCSW called in for psychotherapy - DULoxetine (CYMBALTA) 60 MG capsule; Take 1 capsule (60 mg total) by mouth daily.  Dispense: 30 capsule; Refill: 3   Meds ordered this encounter  Medications   Fezolinetant (VEOZAH) 45 MG TABS    Sig: Take 1 tablet (45 mg total) by mouth daily.    Dispense:  90 tablet    Refill:  1   DULoxetine (CYMBALTA) 60 MG capsule    Sig: Take 1 capsule (60 mg total) by mouth daily.    Dispense:  30 capsule    Refill:  3   rosuvastatin (CRESTOR) 40  MG tablet    Sig: TAKE 1 TABLET (40 MG TOTAL) BY MOUTH DAILY. Must attend upcoming appt prior to further refills.    Dispense:  90 tablet    Refill:  1    Follow-up: Return in about 6 months (around 11/28/2022) for Chronic medical conditions.       Charlott Rakes, MD, FAAFP. [facility], [place]   05/30/2022, 1:06 PM

## 2022-05-30 NOTE — Progress Notes (Signed)
Discuss menopause Wants to start Veozah medication.

## 2022-05-30 NOTE — Progress Notes (Signed)
Patient seen by Pilar Jarvis and Angus Seller, PharmD Candidates on 05/30/22 while they were picking up prescriptions at [place] at [facility].   Blood pressure today was : 178/93, HR 86; on recheck (if >140 or >90): 168/87, HR 83.   Patient does not have an automated home blood pressure machine. She reports that she lives close enough to [facility] to check. Has wrist watch to check but does not trust reading.  Reports normal BP 120/80 typically.   Medication review was performed. They are not taking medications as prescribed. Differences from their prescribed list include: Taking Coreg QD at night.   The following barriers to adherence were noted:  - They do not have cost concerns.  - They do not have transportation concerns.  - They do not need assistance obtaining refills.  - They do not occasionally forget to take some of their prescribed medications.  - They do feel like one/some of their medications make them feel poorly. She reports feeling "drags down" or lethargic when taking Coreg BID due to "low BP".  - They do not have questions or concerns about their medications.  - They do have follow up scheduled with their primary care provider/cardiologist.   The following interventions were completed:  - Medications were reviewed.  - Patient was educated on goal blood pressures and long term health implications of elevated blood pressure.  - Patient was counseled on lifestyle modifications to improve blood pressure, including exercise and diet as she reports no exercise and does not drink coffee but only [facility].  - Patient was counseled on benefit of tobacco cessation. She reports smoking 0.5 PPD.   The patient has follow up scheduled: 11/29/2022  PCP: Dr. Ebbie Latus and Angus Seller, PharmD Candidates   Joseph Art, Pharm.D. PGY-2 Ambulatory Care Pharmacy Resident

## 2022-05-31 ENCOUNTER — Other Ambulatory Visit: Payer: Self-pay

## 2022-05-31 LAB — CMP14+EGFR
ALT: 10 IU/L (ref 0–32)
AST: 12 IU/L (ref 0–40)
Albumin/Globulin Ratio: 1.6 (ref 1.2–2.2)
Albumin: 4.4 g/dL (ref 3.8–4.9)
Alkaline Phosphatase: 86 IU/L (ref 44–121)
BUN/Creatinine Ratio: 11 (ref 9–23)
BUN: 10 mg/dL (ref 6–24)
Bilirubin Total: 0.3 mg/dL (ref 0.0–1.2)
CO2: 23 mmol/L (ref 20–29)
Calcium: 9.6 mg/dL (ref 8.7–10.2)
Chloride: 103 mmol/L (ref 96–106)
Creatinine, Ser: 0.95 mg/dL (ref 0.57–1.00)
Globulin, Total: 2.8 g/dL (ref 1.5–4.5)
Glucose: 106 mg/dL — ABNORMAL HIGH (ref 70–99)
Potassium: 5.2 mmol/L (ref 3.5–5.2)
Sodium: 140 mmol/L (ref 134–144)
Total Protein: 7.2 g/dL (ref 6.0–8.5)
eGFR: 71 mL/min/{1.73_m2} (ref >59)

## 2022-06-01 ENCOUNTER — Other Ambulatory Visit: Payer: Self-pay

## 2022-06-06 ENCOUNTER — Other Ambulatory Visit: Payer: Self-pay

## 2022-06-12 ENCOUNTER — Inpatient Hospital Stay: Admission: RE | Admit: 2022-06-12 | Payer: 59 | Source: Ambulatory Visit

## 2022-07-10 ENCOUNTER — Other Ambulatory Visit: Payer: Self-pay

## 2022-07-10 ENCOUNTER — Other Ambulatory Visit: Payer: Self-pay | Admitting: Family Medicine

## 2022-07-10 NOTE — Telephone Encounter (Signed)
Requested medication (s) are due for refill today -unsure  Requested medication (s) are on the active medication list -yes  Future visit scheduled -yes  Last refill: 11/27/21 8.5g  Notes to clinic: not sure this is long term for this patient- sent for review.  Requested Prescriptions  Pending Prescriptions Disp Refills   albuterol (VENTOLIN HFA) 108 (90 Base) MCG/ACT inhaler 8.5 g 0    Sig: Inhale 1-2 puffs into the lungs every 6 (six) hours as needed for wheezing or shortness of breath.     Pulmonology:  Beta Agonists 2 Failed - 07/10/2022  9:17 AM      Failed - Last BP in normal range    BP Readings from Last 1 Encounters:  05/30/22 (!) 168/87         Passed - Last Heart Rate in normal range    Pulse Readings from Last 1 Encounters:  05/30/22 83         Passed - Valid encounter within last 12 months    Recent Outpatient Visits           1 month ago Encounter for screening mammogram for malignant neoplasm of breast   [place], Charlane Ferretti, MD   7 months ago PAD (peripheral artery disease) [facility])   [place] Charlott Rakes, MD   1 year ago Annual physical exam   [place], Charlane Ferretti, MD   1 year ago CAD S/P DES PCI TO mLAD - Synergy DES 3.0 mm x 12 mm   [place] Charlott Rakes, MD   2 years ago [place], Charlane Ferretti, MD       Future Appointments             In 4 months Charlott Rakes, MD [place]               Requested Prescriptions  Pending Prescriptions Disp Refills   albuterol (VENTOLIN HFA) 108 (90 Base) MCG/ACT inhaler 8.5 g 0    Sig: Inhale 1-2 puffs into the lungs every 6 (six) hours as needed for wheezing or shortness of breath.     Pulmonology:  Beta Agonists 2 Failed - 07/10/2022  9:17 AM      Failed  - Last BP in normal range    BP Readings from Last 1 Encounters:  05/30/22 (!) 168/87         Passed - Last Heart Rate in normal range    Pulse Readings from Last 1 Encounters:  05/30/22 83         Passed - Valid encounter within last 12 months    Recent Outpatient Visits           1 month ago Encounter for screening mammogram for malignant neoplasm of breast   [place], Charlane Ferretti, MD   7 months ago PAD (peripheral artery disease) [facility])   [place] Charlott Rakes, MD   1 year ago Annual physical exam   [place], Charlane Ferretti, MD   1 year ago CAD S/P DES PCI TO mLAD - Synergy DES 3.0 mm x 12 mm   [place], [place], MD   2 years ago Bronchitis   [place], MD       Future Appointments             In 4 months Charlott Rakes, MD [place]

## 2022-07-11 ENCOUNTER — Other Ambulatory Visit: Payer: Self-pay

## 2022-07-11 MED ORDER — ALBUTEROL SULFATE HFA 108 (90 BASE) MCG/ACT IN AERS
1.0000 | INHALATION_SPRAY | Freq: Four times a day (QID) | RESPIRATORY_TRACT | 2 refills | Status: DC | PRN
  Filled 2022-07-11: qty 8.5, 25d supply, fill #0
  Filled 2023-01-31: qty 6.7, 20d supply, fill #1
  Filled 2023-03-06: qty 6.7, 25d supply, fill #2

## 2022-07-12 ENCOUNTER — Other Ambulatory Visit: Payer: Self-pay

## 2022-07-26 ENCOUNTER — Ambulatory Visit: Payer: 59

## 2022-08-07 ENCOUNTER — Other Ambulatory Visit: Payer: Self-pay | Admitting: Family Medicine

## 2022-08-07 DIAGNOSIS — N951 Menopausal and female climacteric states: Secondary | ICD-10-CM

## 2022-08-08 ENCOUNTER — Other Ambulatory Visit: Payer: Self-pay

## 2022-08-08 MED ORDER — GABAPENTIN 300 MG PO CAPS
ORAL_CAPSULE | ORAL | 1 refills | Status: DC
  Filled 2022-08-08: qty 150, 30d supply, fill #0
  Filled 2022-09-05: qty 150, 30d supply, fill #1

## 2022-08-09 ENCOUNTER — Other Ambulatory Visit: Payer: Self-pay

## 2022-09-06 ENCOUNTER — Other Ambulatory Visit: Payer: Self-pay

## 2022-09-17 ENCOUNTER — Other Ambulatory Visit: Payer: Self-pay

## 2022-09-17 ENCOUNTER — Telehealth: Payer: Self-pay | Admitting: Family Medicine

## 2022-09-17 NOTE — Telephone Encounter (Signed)
Both Ct and MM orders needs to be changed from [facility] to [employer] so that her insurance will cover.

## 2022-09-17 NOTE — Telephone Encounter (Signed)
Copied from CRM [id number]. Topic: Referral - Status >> Sep 17, 2022 11:56 AM Macon Large wrote: Reason for CRM: Pt stated her insurance will not cover for her to go to [facility]. Pt stated she has an appt on 10/10/22 and requests that the referral be sent to [facility] [address] ph# [phone]

## 2022-09-18 NOTE — Telephone Encounter (Signed)
Orders have already been changed.

## 2022-09-20 ENCOUNTER — Ambulatory Visit (HOSPITAL_COMMUNITY)
Admission: RE | Admit: 2022-09-20 | Discharge: 2022-09-20 | Disposition: A | Discharge status: Home / Self Care | Payer: 59 | Source: Ambulatory Visit | Attending: Internal Medicine | Admitting: Internal Medicine

## 2022-09-20 ENCOUNTER — Ambulatory Visit (HOSPITAL_BASED_OUTPATIENT_CLINIC_OR_DEPARTMENT_OTHER)
Admission: RE | Admit: 2022-09-20 | Discharge: 2022-09-20 | Disposition: A | Discharge status: Home / Self Care | Payer: 59 | Source: Ambulatory Visit | Attending: Cardiovascular Disease | Admitting: Cardiovascular Disease

## 2022-09-20 DIAGNOSIS — I739 Peripheral vascular disease, unspecified: Secondary | ICD-10-CM

## 2022-09-20 DIAGNOSIS — Z95820 Peripheral vascular angioplasty status with implants and grafts: Secondary | ICD-10-CM | POA: Diagnosis not present

## 2022-09-20 LAB — VAS US ABI WITH/WO TBI
Left ABI: 0.56
Right ABI: 0.48

## 2022-10-08 ENCOUNTER — Encounter: Payer: Self-pay | Admitting: Family Medicine

## 2022-10-08 ENCOUNTER — Other Ambulatory Visit: Payer: Self-pay | Admitting: Family Medicine

## 2022-10-08 ENCOUNTER — Other Ambulatory Visit: Payer: Self-pay

## 2022-10-08 DIAGNOSIS — I2510 Atherosclerotic heart disease of native coronary artery without angina pectoris: Secondary | ICD-10-CM

## 2022-10-08 DIAGNOSIS — I10 Essential (primary) hypertension: Secondary | ICD-10-CM

## 2022-10-08 DIAGNOSIS — F330 Major depressive disorder, recurrent, mild: Secondary | ICD-10-CM

## 2022-10-08 DIAGNOSIS — N951 Menopausal and female climacteric states: Secondary | ICD-10-CM

## 2022-10-08 MED ORDER — GABAPENTIN 300 MG PO CAPS
ORAL_CAPSULE | ORAL | 1 refills | Status: DC
Start: 2022-10-08 — End: 2022-12-12
  Filled 2022-10-08: qty 150, 30d supply, fill #0
  Filled 2022-11-05: qty 150, 30d supply, fill #1

## 2022-10-08 MED ORDER — CARVEDILOL 3.125 MG PO TABS
3.1250 mg | ORAL_TABLET | Freq: Two times a day (BID) | ORAL | 1 refills | Status: DC
Start: 2022-10-08 — End: 2022-11-19
  Filled 2022-10-08: qty 180, 90d supply, fill #0

## 2022-10-08 MED ORDER — ROSUVASTATIN CALCIUM 40 MG PO TABS
40.0000 mg | ORAL_TABLET | Freq: Every day | ORAL | 1 refills | Status: DC
Start: 2022-10-08 — End: 2023-07-16
  Filled 2023-01-31: qty 90, 90d supply, fill #0
  Filled 2023-03-06 – 2023-04-30 (×3): qty 90, 90d supply, fill #1

## 2022-10-08 MED ORDER — DULOXETINE HCL 60 MG PO CPEP
60.0000 mg | ORAL_CAPSULE | Freq: Every day | ORAL | 1 refills | Status: DC
Start: 2022-10-08 — End: 2022-12-12
  Filled 2022-10-08: qty 90, 90d supply, fill #0

## 2022-10-08 MED ORDER — LISINOPRIL 5 MG PO TABS
5.0000 mg | ORAL_TABLET | Freq: Every day | ORAL | 1 refills | Status: DC
Start: 2022-10-08 — End: 2022-10-24
  Filled 2022-10-08: qty 90, 90d supply, fill #0

## 2022-10-08 MED ORDER — VEOZAH 45 MG PO TABS
1.0000 | ORAL_TABLET | Freq: Every day | ORAL | 1 refills | Status: DC
Start: 2022-10-08 — End: 2022-12-12
  Filled 2022-10-08: qty 30, 30d supply, fill #0
  Filled 2022-11-05: qty 30, 30d supply, fill #1

## 2022-10-09 ENCOUNTER — Other Ambulatory Visit: Payer: Self-pay

## 2022-10-09 ENCOUNTER — Ambulatory Visit: Payer: 59 | Attending: Cardiovascular Disease | Admitting: Cardiovascular Disease

## 2022-10-09 VITALS — BP 142/70 | HR 87 | Ht 64.0 in | Wt 164.2 lb

## 2022-10-09 DIAGNOSIS — E785 Hyperlipidemia, unspecified: Secondary | ICD-10-CM

## 2022-10-09 DIAGNOSIS — I25118 Atherosclerotic heart disease of native coronary artery with other forms of angina pectoris: Secondary | ICD-10-CM

## 2022-10-09 DIAGNOSIS — Z720 Tobacco use: Secondary | ICD-10-CM

## 2022-10-09 DIAGNOSIS — I739 Peripheral vascular disease, unspecified: Secondary | ICD-10-CM

## 2022-10-09 LAB — CBC

## 2022-10-09 LAB — COMPREHENSIVE METABOLIC PANEL
Calcium: 9.6 mg/dL (ref 8.7–10.2)
Chloride: 103 mmol/L (ref 96–106)
Creatinine, Ser: 1.01 mg/dL — ABNORMAL HIGH (ref 0.57–1.00)
Total Protein: 7 g/dL (ref 6.0–8.5)

## 2022-10-09 LAB — LIPID PANEL
Triglycerides: 242 mg/dL — ABNORMAL HIGH (ref 0–149)
VLDL Cholesterol Cal: 42 mg/dL — ABNORMAL HIGH (ref 5–40)

## 2022-10-09 NOTE — H&P (View-Only) (Signed)
Cardiology Office Note   Date:  10/09/2022   ID:  LLADIRA Walsh, DOB 10/14/1965, MRN [id number]  PCP:  Hoy Register, MD  Cardiologist:  Dr. Anne Fu  No chief complaint on file.     History of Present Illness: Robin Walsh is a 57 y.o. female who presents for a follow-up visit regarding  peripheral arterial disease. She has known history of coronary artery disease, hypertension, hyperlipidemia and tobacco use. She is status post LAD PCI in 2017. She is status post bilateral common iliac artery kissing stent placement in 12/2015 for severe claudication. She had no significant infrainguinal disease. She had recurrent claudication in 2020 due to severe in-stent restenosis.  Preprocedure labs revealed severe anemia with a hemoglobin of 7.5 and thus the procedure was postponed.  She was hospitalized and transfused.  She was then seen by GI and had EGD and colonoscopy.  She had few polyps removed and she also reports having an anal fissure that was causing the bleeding.  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  I performed successful drug-coated balloon angioplasty to bilateral common iliac arteries.    She returns with significant worsening of bilateral leg claudication that is happening after walking less than 20 steps.  She has to stop and rest before she can resume.  She underwent recent Doppler studies which showed significant drop in ABI bilaterally with significant progression of severe in-stent restenosis in bilateral common iliac arteries. She denies chest pain or worsening dyspnea. She does admit that she has not been taking rosuvastatin on a daily basis. She continues to smoke and is trying to cut down.    Past Medical History:  Diagnosis Date   Anal fissure    Anemia    CAD (coronary artery disease)    Hyperlipidemia    Hypertension     Past Surgical History:  Procedure Laterality Date    ABDOMINAL AORTOGRAM W/LOWER EXTREMITY N/A 01/14/2019   Procedure: ABDOMINAL AORTOGRAM W/LOWER EXTREMITY;  Surgeon: Iran Ouch, MD;  Location: [facility] INV[facility]IVE CV [facility];  Service: Cardiovascular;  Laterality: N/A;  limited   CARDIAC CATHETERIZATION N/A 11/28/2015   Procedure: Left Heart Cath and Coronary Angiography;  Surgeon: Corky Crafts, MD;  Location: [facility] INVASIVE CV LAB;  Service: Cardiovascular;  Laterality: N/A;   CARDIAC CATHETERIZATION N/A 11/28/2015   Procedure: Coronary Stent Intervention;  Surgeon: Corky Crafts, MD;  Location: [facility] INVASIVE CV LAB;  Service: Cardiovascular;  Laterality: N/A;  DES to Mid LAD; 3.0x12 Synergy   LEG SURGERY Left 2002?   tibial plateau fracture   PERIPHERAL VASCULAR BALLOON ANGIOPLASTY Bilateral 01/14/2019   Procedure: PERIPHERAL VASCULAR BALLOON ANGIOPLASTY;  Surgeon: Iran Ouch, MD;  Location: [facility];  Service: Cardiovascular;  Laterality: Bilateral;  common iliac   PERIPHERAL VASCULAR CATHETERIZATION N/A 01/04/2016   Procedure: Abdominal Aortogram;  Surgeon: Yvonne Kendall, MD;  Location: [facility];  Service: Cardiovascular;  Laterality: N/A;   PERIPHERAL VASCULAR CATHETERIZATION Bilateral 01/04/2016   Procedure: Lower Extremity Angiography;  Surgeon: Yvonne Kendall, MD;  Location: [facility] INVASIVE CV LAB;  Service: Cardiovascular;  Laterality: Bilateral;   PERIPHERAL VASCULAR CATHETERIZATION Bilateral 01/04/2016   Procedure: Peripheral Vascular Intervention;  Surgeon: Yvonne Kendall, MD;  Location: [facility]VAS[facility] CV LAB;  Service: Cardiovascular;  Laterality: Bilateral;  common iliac     Current Outpatient Medications  Medication Sig Dispense Refill   albuterol (VENTOLIN HFA) 108 (90 Base) MCG/ACT inhaler  Inhale 1-2 puffs into the lungs every 6 (six) hours as needed for wheezing or shortness of breath. 8.5 g 2   aspirin 81 MG chewable tablet Chew 1 tablet (81 mg total) by mouth daily. (Patient taking differently: Chew 81  mg by mouth every evening.) 30 tablet 11   carvedilol (COREG) 3.125 MG tablet Take 1 tablet (3.125 mg total) by mouth 2 (two) times daily with a meal. 180 tablet 1   DULoxetine (CYMBALTA) 60 MG capsule Take 1 capsule (60 mg total) by mouth daily. 90 capsule 1   Fezolinetant (VEOZAH) 45 MG TABS Take 1 tablet (45 mg total) by mouth daily. 90 tablet 1   gabapentin (NEURONTIN) 300 MG capsule Take 2 capsules (600 mg total) by mouth every morning AND 3 capsules (900 mg total) every evening. 150 capsule 1   lisinopril (ZESTRIL) 5 MG tablet Take 1 tablet (5 mg total) by mouth daily. 90 tablet 1   nitroGLYCERIN (NITROSTAT) 0.4 MG SL tablet Place 1 tablet (0.4 mg total) under the tongue every 5 (five) minutes as needed for chest pain. X 3 doses 25 tablet 1   rosuvastatin (CRESTOR) 40 MG tablet Take 1 tablet (40 mg total) by mouth daily. 90 tablet 1   acetaminophen (TYLENOL) 500 MG tablet Take 500 mg by mouth every 6 (six) hours as needed for headache (for headaches).  (Patient not taking: Reported on 05/30/2022)     ferrous sulfate 325 (65 FE) MG EC tablet Take 1 tablet (325 mg total) by mouth 2 (two) times daily. 60 tablet 3   meclizine (ANTIVERT) 25 MG tablet Take 1 tablet (25 mg total) by mouth 3 (three) times daily as needed for dizziness. (Patient not taking: Reported on 05/30/2022) 60 tablet 1   No current facility-administered medications for this visit.    Allergies:   Chantix [varenicline tartrate], Wellbutrin [bupropion], Codeine, and Dilaudid [hydromorphone hcl]    Social History:  The patient  reports that she has been smoking cigarettes. She has a 12.50 pack-year smoking history. She has never used smokeless tobacco. She reports current drug use. Drug: Marijuana. She reports that she does not drink alcohol.   Family History:  The patient's family history includes Alcohol abuse (age of onset: 77) in her father; Congestive Heart Failure (age of onset: 24) in her mother; Heart disease in her  brother; Liver disease in her sister.    ROS:  Please see the history of present illness.   Otherwise, review of systems are positive for none.   All other systems are reviewed and negative.    PHYSICAL EXAM: VS:  BP (!) 142/70   Pulse 87   Ht 5\' 4"  (1.626 m)   Wt 164 lb 3.2 oz (74.5 kg)   LMP 03/26/2017   SpO2 97%   BMI 28.18 kg/m  , BMI Body mass index is 28.18 kg/m. GEN: Well nourished, well developed, in no acute distress  HEENT: normal  Neck: no JVD, carotid bruits, or masses Cardiac: RRR; no murmurs, rubs, or gallops,no edema  Respiratory:  clear to auscultation bilaterally, normal work of breathing GI: soft, nontender, nondistended, + BS MS: no deformity or atrophy  Skin: warm and dry, no rash Neuro:  Strength and sensation are intact Psych: euthymic mood, full affect   EKG:  EKG is ordered today. EKG showed normal sinus rhythm with no significant ST or T wave changes.   Recent Labs: 05/30/2022: ALT 10; BUN 10; Creatinine, Ser 0.95; Potassium 5.2; Sodium 140  Lipid Panel    Component Value Date/Time   CHOL 139 05/09/2021 0835   TRIG 246 (H) 05/09/2021 0835   HDL 28 (L) 05/09/2021 0835   CHOLHDL 4.3 02/29/2020 0851   CHOLHDL 7.8 11/27/2015 0453   VLDL 66 (H) 11/27/2015 0453   LDLCALC 71 05/09/2021 0835      Wt Readings from Last 3 Encounters:  10/09/22 164 lb 3.2 oz (74.5 kg)  05/30/22 170 lb 6.4 oz (77.3 kg)  11/27/21 167 lb 9.6 oz (76 kg)           No data to display            ASSESSMENT AND PLAN:  1.  Peripheral arterial disease: Status post drug-coated balloon angioplasty for severe bilateral common iliac artery in-stent restenosis.  She now has recurrent severe bilateral calf claudication Rutherford class III that has been progressive over the last few months.  Her ABI dropped significantly bilaterally with significant progression of restenosis.   She has not been able to do much walking due to severity of her symptoms. I recommend  proceeding with abdominal aortogram with lower extremity angiography and possible endovascular intervention.  I discussed the procedure in details as well as risks and benefits.  2. Tobacco use: I again discussed with her the importance of smoking cessation  3. Coronary artery disease involving native coronary arteries with other forms of angina:   Continue medical therapy.  No anginal symptoms at the present time.  4. Hyperlipidemia: she does admit that she has not been taking rosuvastatin on a regular basis.  I discussed with her the importance of compliance.  I requested lipid and liver profile.     Disposition:   Proceed with abdominal aortogram with lower extremity angiography and follow-up after.  Signed,  Lorine Bears, MD  10/09/2022 11:02 AM    [facility]

## 2022-10-09 NOTE — Patient Instructions (Signed)
Medication Instructions:  No changes *If you need a refill on your cardiac medications before your next appointment, please call your pharmacy*   Lab Work: Your provider would like for you to have the following labs today: CBC, CMET and Lipid  If you have labs (blood work) drawn today and your tests are completely normal, you will receive your results only by: MyChart Message (if you have MyChart) OR A paper copy in the mail If you have any lab test that is abnormal or we need to change your treatment, we will call you to review the results.   Testing/Procedures: Your physician has requested that you have a peripheral vascular angiogram. This exam is performed at the hospital. During this exam IV contrast is used to look at arterial blood flow. Please review the information sheet given for details.    Follow-Up: At [facility], you and your health needs are our priority.  As part of our continuing mission to provide you with exceptional heart care, we have created designated Provider Care Teams.  These Care Teams include your primary Cardiologist (physician) and Advanced Practice Providers (APPs -  Physician Assistants and Nurse Practitioners) who all work together to provide you with the care you need, when you need it.  We recommend signing up for the patient portal called "MyChart".  Sign up information is provided on this After Visit Summary.  MyChart is used to connect with patients for Virtual Visits (Telemedicine).  Patients are able to view lab/test results, encounter notes, upcoming appointments, etc.  Non-urgent messages can be sent to your provider as well.   To learn more about what you can do with MyChart, go to [email or web address].    Your next appointment:   Keep your post procedure follow up on 8/12 at 3:35 pm Other Instructions  [name] A DEPT OF [facility] AT [facility] [address] [place] [id number] [name] [address] Dept: [phone] Loc: [phone]  Robin Walsh  10/09/2022  You are scheduled for a Peripheral Angiogram on Wednesday, July 17 with Dr. Lorine Walsh.  1. Please arrive at the [facility] (Main Entrance A) at [facility]: [address] [place], [place] [address] at 8:30 AM (This time is 2 hour(s) before your procedure to ensure your preparation). Free valet parking service is available. You will check in at ADMITTING. The support person will be asked to wait in the waiting room.  It is OK to have someone drop you off and come back when you are ready to be discharged.    Special note: Every effort is made to have your procedure done on time. Please understand that emergencies sometimes delay scheduled procedures.  2. Diet: Do not eat solid foods after midnight.  The patient may have clear liquids until 5am upon the day of the procedure.  3. Labs: You will need to have blood drawn on 10/09/22.  4. Medication instructions in preparation for your procedure: Nothing to hold  On the morning of your procedure, take your Aspirin 81 mg and any morning medicines NOT listed above.  You may use sips of water.  5. Plan to go home the same day, you will only stay overnight if medically necessary. 6. Bring a current list of your medications and current insurance cards. 7. You MUST have a responsible person to drive you home. 8. Someone MUST be with you the first 24 hours after you arrive home or  your discharge will be delayed. 9. Please wear clothes that are easy to get on and off and wear slip-on shoes.  Thank you for allowing [place] to care for you!   -- [facility]

## 2022-10-09 NOTE — Progress Notes (Signed)
Cardiology Office Note   Date:  10/09/2022   ID:  [name], DOB 10/14/1965, MRN [id number]  PCP:  Hoy Register, MD  Cardiologist:  Dr. Anne Fu  No chief complaint on file.     History of Present Illness: Robin Walsh is a 57 y.o. female who presents for a follow-up visit regarding  peripheral arterial disease. She has known history of coronary artery disease, hypertension, hyperlipidemia and tobacco use. She is status post LAD PCI in 2017. She is status post bilateral common iliac artery kissing stent placement in 12/2015 for severe claudication. She had no significant infrainguinal disease. She had recurrent claudication in 2020 due to severe in-stent restenosis.  Preprocedure labs revealed severe anemia with a hemoglobin of 7.5 and thus the procedure was postponed.  She was hospitalized and transfused.  She was then seen by GI and had EGD and colonoscopy.  She had few polyps removed and she also reports having an anal fissure that was causing the bleeding.  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  I performed successful drug-coated balloon angioplasty to bilateral common iliac arteries.    She returns with significant worsening of bilateral leg claudication that is happening after walking less than 20 steps.  She has to stop and rest before she can resume.  She underwent recent Doppler studies which showed significant drop in ABI bilaterally with significant progression of severe in-stent restenosis in bilateral common iliac arteries. She denies chest pain or worsening dyspnea. She does admit that she has not been taking rosuvastatin on a daily basis. She continues to smoke and is trying to cut down.    Past Medical History:  Diagnosis Date   Anal fissure    Anemia    CAD (coronary artery disease)    Hyperlipidemia    Hypertension     Past Surgical History:  Procedure Laterality Date    ABDOMINAL AORTOGRAM W/LOWER EXTREMITY N/A 01/14/2019   Procedure: ABDOMINAL AORTOGRAM W/LOWER EXTREMITY;  Surgeon: Iran Ouch, MD;  Location: [facility] INVASIVE CV LAB;  Service: Cardiovascular;  Laterality: N/A;  limited   CARDIAC CATHETERIZATION N/A 11/28/2015   Procedure: Left Heart Cath and Coronary Angiography;  Surgeon: Corky Crafts, MD;  Location: [facility] INVASIVE CV LAB;  Service: Cardiovascular;  Laterality: N/A;   CARDIAC CATHETERIZATION N/A 11/28/2015   Procedure: Coronary Stent Intervention;  Surgeon: Corky Crafts, MD;  Location: [facility] INVASIVE CV LAB;  Service: Cardiovascular;  Laterality: N/A;  DES to Mid LAD; 3.0x12 Synergy   LEG SURGERY Left 2002?   tibial plateau fracture   PERIPHERAL VASCULAR BALLOON ANGIOPLASTY Bilateral 01/14/2019   Procedure: PERIPHERAL VASCULAR BALLOON ANGIOPLASTY;  Surgeon: Iran Ouch, MD;  Location: [facility];  Service: Cardiovascular;  Laterality: Bilateral;  common iliac   PERIPHERAL VASCULAR CATHETERIZATION N/A 01/04/2016   Procedure: Abdominal Aortogram;  Surgeon: Yvonne Kendall, MD;  Location: [facility]VAS[facility];  Service: Cardiovascular;  Laterality: N/A;   PERIPHERAL VASCULAR CATHETERIZATION Bilateral 01/04/2016   Procedure: Lower Extremity Angiography;  Surgeon: Yvonne Kendall, MD;  Location: [facility] INVASIVE CV LAB;  Service: Cardiovascular;  Laterality: Bilateral;   PERIPHERAL VASCULAR CATHETERIZATION Bilateral 01/04/2016   Procedure: Peripheral Vascular Intervention;  Surgeon: Yvonne Kendall, MD;  Location: [facility] INVASIVE CV LAB;  Service: Cardiovascular;  Laterality: Bilateral;  common iliac     Current Outpatient Medications  Medication Sig Dispense Refill   albuterol (VENTOLIN HFA) 108 (90 Base) MCG/ACT inhaler  Inhale 1-2 puffs into the lungs every 6 (six) hours as needed for wheezing or shortness of breath. 8.5 g 2   aspirin 81 MG chewable tablet Chew 1 tablet (81 mg total) by mouth daily. (Patient taking differently: Chew 81  mg by mouth every evening.) 30 tablet 11   carvedilol (COREG) 3.125 MG tablet Take 1 tablet (3.125 mg total) by mouth 2 (two) times daily with a meal. 180 tablet 1   DULoxetine (CYMBALTA) 60 MG capsule Take 1 capsule (60 mg total) by mouth daily. 90 capsule 1   Fezolinetant (VEOZAH) 45 MG TABS Take 1 tablet (45 mg total) by mouth daily. 90 tablet 1   gabapentin (NEURONTIN) 300 MG capsule Take 2 capsules (600 mg total) by mouth every morning AND 3 capsules (900 mg total) every evening. 150 capsule 1   lisinopril (ZESTRIL) 5 MG tablet Take 1 tablet (5 mg total) by mouth daily. 90 tablet 1   nitroGLYCERIN (NITROSTAT) 0.4 MG SL tablet Place 1 tablet (0.4 mg total) under the tongue every 5 (five) minutes as needed for chest pain. X 3 doses 25 tablet 1   rosuvastatin (CRESTOR) 40 MG tablet Take 1 tablet (40 mg total) by mouth daily. 90 tablet 1   acetaminophen (TYLENOL) 500 MG tablet Take 500 mg by mouth every 6 (six) hours as needed for headache (for headaches).  (Patient not taking: Reported on 05/30/2022)     ferrous sulfate 325 (65 FE) MG EC tablet Take 1 tablet (325 mg total) by mouth 2 (two) times daily. 60 tablet 3   meclizine (ANTIVERT) 25 MG tablet Take 1 tablet (25 mg total) by mouth 3 (three) times daily as needed for dizziness. (Patient not taking: Reported on 05/30/2022) 60 tablet 1   No current facility-administered medications for this visit.    Allergies:   Chantix [varenicline tartrate], Wellbutrin [bupropion], Codeine, and Dilaudid [hydromorphone hcl]    Social History:  The patient  reports that she has been smoking cigarettes. She has a 12.50 pack-year smoking history. She has never used smokeless tobacco. She reports current drug use. Drug: Marijuana. She reports that she does not drink alcohol.   Family History:  The patient's family history includes Alcohol abuse (age of onset: 77) in her father; Congestive Heart Failure (age of onset: 24) in her mother; Heart disease in her  brother; Liver disease in her sister.    ROS:  Please see the history of present illness.   Otherwise, review of systems are positive for none.   All other systems are reviewed and negative.    PHYSICAL EXAM: VS:  BP (!) 142/70   Pulse 87   Ht 5\' 4"  (1.626 m)   Wt 164 lb 3.2 oz (74.5 kg)   LMP 03/26/2017   SpO2 97%   BMI 28.18 kg/m  , BMI Body mass index is 28.18 kg/m. GEN: Well nourished, well developed, in no acute distress  HEENT: normal  Neck: no JVD, carotid bruits, or masses Cardiac: RRR; no murmurs, rubs, or gallops,no edema  Respiratory:  clear to auscultation bilaterally, normal work of breathing GI: soft, nontender, nondistended, + BS MS: no deformity or atrophy  Skin: warm and dry, no rash Neuro:  Strength and sensation are intact Psych: euthymic mood, full affect   EKG:  EKG is ordered today. EKG showed normal sinus rhythm with no significant ST or T wave changes.   Recent Labs: 05/30/2022: ALT 10; BUN 10; Creatinine, Ser 0.95; Potassium 5.2; Sodium 140  Lipid Panel    Component Value Date/Time   CHOL 139 05/09/2021 0835   TRIG 246 (H) 05/09/2021 0835   HDL 28 (L) 05/09/2021 0835   CHOLHDL 4.3 02/29/2020 0851   CHOLHDL 7.8 11/27/2015 0453   VLDL 66 (H) 11/27/2015 0453   LDLCALC 71 05/09/2021 0835      Wt Readings from Last 3 Encounters:  10/09/22 164 lb 3.2 oz (74.5 kg)  05/30/22 170 lb 6.4 oz (77.3 kg)  11/27/21 167 lb 9.6 oz (76 kg)           No data to display            ASSESSMENT AND PLAN:  1.  Peripheral arterial disease: Status post drug-coated balloon angioplasty for severe bilateral common iliac artery in-stent restenosis.  She now has recurrent severe bilateral calf claudication Rutherford class III that has been progressive over the last few months.  Her ABI dropped significantly bilaterally with significant progression of restenosis.   She has not been able to do much walking due to severity of her symptoms. I recommend  proceeding with abdominal aortogram with lower extremity angiography and possible endovascular intervention.  I discussed the procedure in details as well as risks and benefits.  2. Tobacco use: I again discussed with her the importance of smoking cessation  3. Coronary artery disease involving native coronary arteries with other forms of angina:   Continue medical therapy.  No anginal symptoms at the present time.  4. Hyperlipidemia: she does admit that she has not been taking rosuvastatin on a regular basis.  I discussed with her the importance of compliance.  I requested lipid and liver profile.     Disposition:   Proceed with abdominal aortogram with lower extremity angiography and follow-up after.  Signed,  Lorine Bears, MD  10/09/2022 11:02 AM    [facility]

## 2022-10-10 ENCOUNTER — Ambulatory Visit (INDEPENDENT_AMBULATORY_CARE_PROVIDER_SITE_OTHER): Payer: 59

## 2022-10-10 DIAGNOSIS — Z1231 Encounter for screening mammogram for malignant neoplasm of breast: Secondary | ICD-10-CM

## 2022-10-10 DIAGNOSIS — F17210 Nicotine dependence, cigarettes, uncomplicated: Secondary | ICD-10-CM

## 2022-10-10 DIAGNOSIS — Z122 Encounter for screening for malignant neoplasm of respiratory organs: Secondary | ICD-10-CM

## 2022-10-10 LAB — COMPREHENSIVE METABOLIC PANEL
ALT: 8 IU/L (ref 0–32)
AST: 12 IU/L (ref 0–40)
Albumin: 4.3 g/dL (ref 3.8–4.9)
Alkaline Phosphatase: 94 IU/L (ref 44–121)
BUN/Creatinine Ratio: 11 (ref 9–23)
BUN: 11 mg/dL (ref 6–24)
Bilirubin Total: 0.3 mg/dL (ref 0.0–1.2)
CO2: 22 mmol/L (ref 20–29)
Globulin, Total: 2.7 g/dL (ref 1.5–4.5)
Glucose: 104 mg/dL — ABNORMAL HIGH (ref 70–99)
Potassium: 4.9 mmol/L (ref 3.5–5.2)
Sodium: 138 mmol/L (ref 134–144)
eGFR: 66 mL/min/{1.73_m2} (ref >59)

## 2022-10-10 LAB — LIPID PANEL
Chol/HDL Ratio: 6.5 ratio — ABNORMAL HIGH (ref 0.0–4.4)
Cholesterol, Total: 182 mg/dL (ref 100–199)
HDL: 28 mg/dL — ABNORMAL LOW (ref >39)
LDL Chol Calc (NIH): 112 mg/dL — ABNORMAL HIGH (ref 0–99)

## 2022-10-10 LAB — CBC
Hematocrit: 36.3 % (ref 34.0–46.6)
Hemoglobin: 12.3 g/dL (ref 11.1–15.9)
MCHC: 33.9 g/dL (ref 31.5–35.7)
MCV: 93 fL (ref 79–97)
Platelets: 344 10*3/uL (ref 150–450)
RBC: 3.91 x10E6/uL (ref 3.77–5.28)
RDW: 13.2 % (ref 11.7–15.4)
WBC: 9.8 10*3/uL (ref 3.4–10.8)

## 2022-10-23 ENCOUNTER — Telehealth: Payer: Self-pay | Admitting: *Deleted

## 2022-10-23 NOTE — Telephone Encounter (Signed)
Abdominal Aortogram scheduled at [facility] for: Wednesday October 24, 2022 10:30 AM Arrival time [facility] Main Entrance A at: 8:30 AM  Nothing to eat after midnight prior to procedure, clear liquids until 5 AM day of procedure.  Medication instructions: -Usual morning medications can be taken with sips of water including aspirin 81 mg.  Confirmed patient has responsible adult to drive home post procedure and be with patient first 24 hours after arriving home.  Plan to go home the same day, you will only stay overnight if medically necessary.  Reviewed procedure instructions with patient.

## 2022-10-24 ENCOUNTER — Ambulatory Visit (HOSPITAL_COMMUNITY)
Admission: RE | Admit: 2022-10-24 | Discharge: 2022-10-24 | Disposition: A | Discharge status: Home / Self Care | Payer: 59 | Attending: Cardiovascular Disease | Admitting: Cardiovascular Disease

## 2022-10-24 ENCOUNTER — Encounter (HOSPITAL_COMMUNITY)
Admission: RE | Disposition: A | Discharge status: Home / Self Care | Payer: Self-pay | Source: Home / Self Care | Attending: Cardiovascular Disease

## 2022-10-24 ENCOUNTER — Other Ambulatory Visit: Payer: Self-pay

## 2022-10-24 DIAGNOSIS — I739 Peripheral vascular disease, unspecified: Secondary | ICD-10-CM | POA: Diagnosis not present

## 2022-10-24 DIAGNOSIS — I708 Atherosclerosis of other arteries: Secondary | ICD-10-CM | POA: Insufficient documentation

## 2022-10-24 DIAGNOSIS — I25118 Atherosclerotic heart disease of native coronary artery with other forms of angina pectoris: Secondary | ICD-10-CM | POA: Insufficient documentation

## 2022-10-24 DIAGNOSIS — I10 Essential (primary) hypertension: Secondary | ICD-10-CM | POA: Diagnosis not present

## 2022-10-24 DIAGNOSIS — I70213 Atherosclerosis of native arteries of extremities with intermittent claudication, bilateral legs: Secondary | ICD-10-CM

## 2022-10-24 DIAGNOSIS — E785 Hyperlipidemia, unspecified: Secondary | ICD-10-CM | POA: Insufficient documentation

## 2022-10-24 DIAGNOSIS — F17210 Nicotine dependence, cigarettes, uncomplicated: Secondary | ICD-10-CM | POA: Insufficient documentation

## 2022-10-24 DIAGNOSIS — Y832 Surgical operation with anastomosis, bypass or graft as the cause of abnormal reaction of the patient, or of later complication, without mention of misadventure at the time of the procedure: Secondary | ICD-10-CM | POA: Insufficient documentation

## 2022-10-24 DIAGNOSIS — T82856A Stenosis of peripheral vascular stent, initial encounter: Secondary | ICD-10-CM | POA: Insufficient documentation

## 2022-10-24 DIAGNOSIS — Z79899 Other long term (current) drug therapy: Secondary | ICD-10-CM | POA: Diagnosis not present

## 2022-10-24 HISTORY — PX: ABDOMINAL AORTOGRAM W/LOWER EXTREMITY: CATH118223

## 2022-10-24 HISTORY — PX: PERIPHERAL VASCULAR BALLOON ANGIOPLASTY: CATH118281

## 2022-10-24 HISTORY — PX: PERIPHERAL VASCULAR INTERVENTION: CATH118257

## 2022-10-24 SURGERY — ABDOMINAL AORTOGRAM W/LOWER EXTREMITY
Anesthesia: LOCAL

## 2022-10-24 MED ORDER — SODIUM CHLORIDE 0.9 % WEIGHT BASED INFUSION: 1.0000 mL/kg/h | INTRAVENOUS | Status: DC

## 2022-10-24 MED ORDER — TRAMADOL HCL 50 MG PO TABS: 50.0000 mg | ORAL_TABLET | Freq: Four times a day (QID) | ORAL | Status: DC | PRN

## 2022-10-24 MED ORDER — SODIUM CHLORIDE 0.9 % WEIGHT BASED INFUSION
3.0000 mL/kg/h | INTRAVENOUS | Status: AC
  Administered 2022-10-24: 3 mL/kg/h via INTRAVENOUS

## 2022-10-24 MED ORDER — FENTANYL CITRATE (PF) 100 MCG/2ML IJ SOLN
INTRAMUSCULAR | Status: AC
  Filled 2022-10-24: qty 2

## 2022-10-24 MED ORDER — MIDAZOLAM HCL 2 MG/2ML IJ SOLN
INTRAMUSCULAR | Status: AC
  Filled 2022-10-24: qty 2

## 2022-10-24 MED ORDER — CLOPIDOGREL BISULFATE 75 MG PO TABS: 75.0000 mg | ORAL_TABLET | Freq: Every day | ORAL | Status: DC

## 2022-10-24 MED ORDER — SODIUM CHLORIDE 0.9% FLUSH: 3.0000 mL | Freq: Two times a day (BID) | INTRAVENOUS | Status: DC

## 2022-10-24 MED ORDER — HEPARIN SODIUM (PORCINE) 1000 UNIT/ML IJ SOLN
INTRAMUSCULAR | Status: AC
  Filled 2022-10-24: qty 10

## 2022-10-24 MED ORDER — HEPARIN SODIUM (PORCINE) 1000 UNIT/ML IJ SOLN
INTRAMUSCULAR | Status: DC | PRN
  Administered 2022-10-24 (×2): 3000 [IU] via INTRAVENOUS
  Administered 2022-10-24: 7000 [IU] via INTRAVENOUS

## 2022-10-24 MED ORDER — ACETAMINOPHEN 325 MG PO TABS: 650.0000 mg | ORAL_TABLET | ORAL | Status: DC | PRN

## 2022-10-24 MED ORDER — HEPARIN (PORCINE) IN NACL 1000-0.9 UT/500ML-% IV SOLN
INTRAVENOUS | Status: DC | PRN
  Administered 2022-10-24 (×3): 500 mL

## 2022-10-24 MED ORDER — MIDAZOLAM HCL 2 MG/2ML IJ SOLN
INTRAMUSCULAR | Status: DC | PRN
  Administered 2022-10-24 (×3): 1 mg via INTRAVENOUS

## 2022-10-24 MED ORDER — CLOPIDOGREL BISULFATE 300 MG PO TABS
ORAL_TABLET | ORAL | Status: DC | PRN
  Administered 2022-10-24: 300 mg via ORAL

## 2022-10-24 MED ORDER — LISINOPRIL 10 MG PO TABS
10.0000 mg | ORAL_TABLET | Freq: Every day | ORAL | 6 refills | Status: DC
Start: 2022-10-24 — End: 2023-08-06
  Filled 2023-02-04: qty 30, 30d supply, fill #0
  Filled 2023-03-06: qty 30, 30d supply, fill #1
  Filled 2023-04-08: qty 30, 30d supply, fill #2
  Filled 2023-04-30 – 2023-05-02 (×2): qty 30, 30d supply, fill #3
  Filled 2023-06-10: qty 30, 30d supply, fill #4
  Filled 2023-07-09: qty 30, 30d supply, fill #5

## 2022-10-24 MED ORDER — LABETALOL HCL 5 MG/ML IV SOLN: 10.0000 mg | INTRAVENOUS | Status: DC | PRN

## 2022-10-24 MED ORDER — LIDOCAINE HCL (PF) 1 % IJ SOLN
INTRAMUSCULAR | Status: AC
  Filled 2022-10-24: qty 30

## 2022-10-24 MED ORDER — IODIXANOL 320 MG/ML IV SOLN
INTRAVENOUS | Status: DC | PRN
  Administered 2022-10-24: 207 mL

## 2022-10-24 MED ORDER — SODIUM CHLORIDE 0.9 % IV SOLN: 250.0000 mL | INTRAVENOUS | Status: DC | PRN

## 2022-10-24 MED ORDER — CLOPIDOGREL BISULFATE 300 MG PO TABS
ORAL_TABLET | ORAL | Status: AC
  Filled 2022-10-24: qty 1

## 2022-10-24 MED ORDER — CLOPIDOGREL BISULFATE 75 MG PO TABS: 75.0000 mg | ORAL_TABLET | Freq: Every day | ORAL | 3 refills | Status: DC

## 2022-10-24 MED ORDER — LABETALOL HCL 5 MG/ML IV SOLN
INTRAVENOUS | Status: DC | PRN
  Administered 2022-10-24 (×3): 10 mg via INTRAVENOUS

## 2022-10-24 MED ORDER — LABETALOL HCL 5 MG/ML IV SOLN
INTRAVENOUS | Status: AC
  Filled 2022-10-24: qty 4

## 2022-10-24 MED ORDER — LIDOCAINE HCL (PF) 1 % IJ SOLN
INTRAMUSCULAR | Status: DC | PRN
  Administered 2022-10-24 (×2): 10 mL

## 2022-10-24 MED ORDER — ONDANSETRON HCL 4 MG/2ML IJ SOLN: 4.0000 mg | Freq: Four times a day (QID) | INTRAMUSCULAR | Status: DC | PRN

## 2022-10-24 MED ORDER — SODIUM CHLORIDE 0.9% FLUSH: 3.0000 mL | INTRAVENOUS | Status: DC | PRN

## 2022-10-24 MED ORDER — FENTANYL CITRATE (PF) 100 MCG/2ML IJ SOLN
INTRAMUSCULAR | Status: DC | PRN
  Administered 2022-10-24 (×3): 50 ug via INTRAVENOUS
  Administered 2022-10-24 (×2): 25 ug via INTRAVENOUS

## 2022-10-24 MED ORDER — ASPIRIN 81 MG PO CHEW
81.0000 mg | CHEWABLE_TABLET | ORAL | Status: AC
  Administered 2022-10-24: 81 mg via ORAL

## 2022-10-24 SURGICAL SUPPLY — 39 items
BALLN MUSTANG 5.0X40 135 (BALLOONS) ×2
BALLN MUSTANG 5.0X40 75 (BALLOONS) ×2
BALLN MUSTANG 7.0X40 135 (BALLOONS) ×2
BALLN MUSTANG 7X20X135 (BALLOONS) ×2
BALLN STERLING OTW 4X20X135 (BALLOONS) ×2
BALLOON MUSTANG 5.0X40 135 (BALLOONS) IMPLANT
BALLOON MUSTANG 5.0X40 75 (BALLOONS) IMPLANT
BALLOON MUSTANG 7.0X40 135 (BALLOONS) IMPLANT
BALLOON MUSTANG 7X20X135 (BALLOONS) IMPLANT
BALLOON STERLING OTW 4X20X135 (BALLOONS) IMPLANT
CATH ANGIO 5F BER2 65CM (CATHETERS) IMPLANT
CATH ANGIO 5F PIGTAIL 65CM (CATHETERS) IMPLANT
CATH CROSS OVER TEMPO 5F (CATHETERS) IMPLANT
CATH NAVICROSS ANGLED 90CM (MICROCATHETER) IMPLANT
CATH QUICKCROSS SUPP .018X90CM (MICROCATHETER) IMPLANT
DEVICE CLOSURE MYNXGRIP 6/7F (Vascular Products) IMPLANT
GLIDEWIRE ADV .035X260CM (WIRE) IMPLANT
KIT ENCORE 26 ADVANTAGE (KITS) IMPLANT
KIT MICROPUNCTURE NIT STIFF (SHEATH) IMPLANT
KIT PV (KITS) ×2 IMPLANT
KIT SINGLE USE MANIFOLD (KITS) IMPLANT
KIT SYRINGE INJ CVI SPIKEX1 (MISCELLANEOUS) IMPLANT
SET ATX-X65L (MISCELLANEOUS) IMPLANT
SHEATH BRITE TIP 7FR 35CM (SHEATH) IMPLANT
SHEATH PINNACLE 5F 10CM (SHEATH) IMPLANT
SHEATH PINNACLE 7F 10CM (SHEATH) IMPLANT
SHEATH PROBE COVER 6X72 (BAG) IMPLANT
STENT ABSOLUTE PRO 8X30X135 (Permanent Stent) IMPLANT
STENT VIABAHN 7X39 6FR 80 (Permanent Stent) IMPLANT
STOPCOCK MORSE 400PSI 3WAY (MISCELLANEOUS) IMPLANT
TRAY PV CATH (CUSTOM PROCEDURE TRAY) ×2 IMPLANT
TUBING CIL FLEX 10 FLL-RA (TUBING) IMPLANT
WIRE G V18X300CM (WIRE) IMPLANT
WIRE HI TORQ VERSACORE 300 (WIRE) IMPLANT
WIRE HITORQ VERSACORE ST 145CM (WIRE) IMPLANT
WIRE SHEPHERD 30G .018 (WIRE) IMPLANT
WIRE TORQFLEX AUST .018X40CM (WIRE) IMPLANT
WIRE TREASURE-12 .018X300CM (WIRE) IMPLANT
WIRE VERSACORE LOC 115CM (WIRE) IMPLANT

## 2022-10-24 NOTE — Interval H&P Note (Signed)
History and Physical Interval Note:  10/24/2022 11:23 AM  Robin Walsh  has presented today for surgery, with the diagnosis of pad.  The various methods of treatment have been discussed with the patient and family. After consideration of risks, benefits and other options for treatment, the patient has consented to  Procedure(s): ABDOMINAL AORTOGRAM W/LOWER EXTREMITY (N/A) as a surgical intervention.  The patient's history has been reviewed, patient examined, no change in status, stable for surgery.  I have reviewed the patient's chart and labs.  Questions were answered to the patient's satisfaction.     Lorine Bears

## 2022-10-24 NOTE — Progress Notes (Signed)
   10/24/22 1652  Vitals  Temp 98.4 F (36.9 C)  Temp Source Oral  BP 125/79  MAP (mmHg) 94  BP Location Left Arm  BP Method Automatic  Patient Position (if appropriate) Lying  Pulse Rate 78  Pulse Rate Source Monitor  ECG Heart Rate 76  Resp 12  Level of Consciousness  Level of Consciousness Alert  MEWS COLOR  MEWS Score Color Green  Oxygen Therapy  SpO2 98 %  O2 Device Room Air  MEWS Score  MEWS Temp 0  MEWS Systolic 0  MEWS Pulse 0  MEWS RR 1  MEWS LOC 0  MEWS Score 1   Patient arrived from Cath lab to 4E05, pt placed on monitor vital signs obtained, pt with B groins level zero no hematoma noted at this time. Pt States she will be discharged at 7pm. Family in room call bell with in reach. Chara Marquard, Randall An RN

## 2022-10-24 NOTE — Progress Notes (Signed)
The patient is feeling better with resolution of leg and groin pain which she was having during the procedure.  Her blood pressure improved.  Her groins are intact with no evidence of hematoma.  Distal pulses are +2 throughout. The patient is requesting to go home.  Given improvement in her symptoms and blood pressure, the patient can be discharged at 7 PM tonight if she remains stable.  I will place discharge orders and send a prescription to clopidogrel and increase the dose of lisinopril for better blood pressure control.

## 2022-10-24 NOTE — Progress Notes (Signed)
Purewick placed, peri care given, tolerated well

## 2022-10-24 NOTE — Progress Notes (Signed)
Patient given discharge instructions medication list and follow up appointments,  B groins level 0 no hematoma or bleeding noted at this time. IV and tele were dcd. Will discharge home as ordered. Transported to exit via wheel chair and nursing staff. Cade Olberding, Randall An RN

## 2022-10-25 ENCOUNTER — Encounter (HOSPITAL_COMMUNITY): Payer: Self-pay | Admitting: Cardiovascular Disease

## 2022-10-25 ENCOUNTER — Other Ambulatory Visit: Payer: Self-pay | Admitting: *Deleted

## 2022-10-25 ENCOUNTER — Encounter: Payer: Self-pay | Admitting: Cardiovascular Disease

## 2022-10-25 DIAGNOSIS — I739 Peripheral vascular disease, unspecified: Secondary | ICD-10-CM

## 2022-10-25 LAB — POCT ACTIVATED CLOTTING TIME
Activated Clotting Time: 232 seconds
Activated Clotting Time: 244 seconds
Activated Clotting Time: 293 seconds

## 2022-11-05 ENCOUNTER — Telehealth: Payer: Self-pay | Admitting: Cardiovascular Disease

## 2022-11-05 NOTE — Telephone Encounter (Signed)
Please see below the providers response.  Hearing her heartbeats is not an indication for carotid Doppler.  We can evaluate the need to do carotid Doppler upon follow-up.

## 2022-11-05 NOTE — Telephone Encounter (Signed)
Robin Walsh from Emergency Services sent the message below. Please advise.    Called patient to schedule her post procedure aortic doppler and she is requesting to also have a carotid doppler done because she states that she can hear her heartbeat. Please advise.

## 2022-11-05 NOTE — Telephone Encounter (Signed)
Hearing her heartbeats is not an indication for carotid Doppler.  We can evaluate the need to do carotid Doppler upon follow-up.

## 2022-11-05 NOTE — Telephone Encounter (Signed)
  Called patient to schedule her post procedure aortic doppler and she is requesting to also have a carotid doppler done because she states that she can hear her heartbeat. Please advise.

## 2022-11-05 NOTE — Telephone Encounter (Signed)
The patient has been called and made aware. Follow up appointment is on 8/12.

## 2022-11-07 ENCOUNTER — Other Ambulatory Visit: Payer: Self-pay

## 2022-11-16 ENCOUNTER — Ambulatory Visit (HOSPITAL_BASED_OUTPATIENT_CLINIC_OR_DEPARTMENT_OTHER)
Admission: RE | Admit: 2022-11-16 | Discharge: 2022-11-16 | Disposition: A | Discharge status: Home / Self Care | Payer: 59 | Source: Ambulatory Visit | Attending: Cardiology | Admitting: Cardiology

## 2022-11-16 ENCOUNTER — Ambulatory Visit (HOSPITAL_COMMUNITY)
Admission: RE | Admit: 2022-11-16 | Discharge: 2022-11-16 | Disposition: A | Discharge status: Home / Self Care | Payer: 59 | Source: Ambulatory Visit | Attending: Cardiology | Admitting: Cardiology

## 2022-11-16 DIAGNOSIS — I739 Peripheral vascular disease, unspecified: Secondary | ICD-10-CM

## 2022-11-16 DIAGNOSIS — Z95828 Presence of other vascular implants and grafts: Secondary | ICD-10-CM | POA: Diagnosis present

## 2022-11-16 LAB — VAS US ABI WITH/WO TBI
Left ABI: 1.12
Right ABI: 1.18

## 2022-11-19 ENCOUNTER — Ambulatory Visit: Payer: 59 | Attending: Physician Assistant | Admitting: Physician Assistant

## 2022-11-19 ENCOUNTER — Encounter: Payer: Self-pay | Admitting: Physician Assistant

## 2022-11-19 VITALS — BP 132/56 | HR 55 | Ht 64.0 in | Wt 164.2 lb

## 2022-11-19 DIAGNOSIS — E785 Hyperlipidemia, unspecified: Secondary | ICD-10-CM

## 2022-11-19 DIAGNOSIS — Z79899 Other long term (current) drug therapy: Secondary | ICD-10-CM | POA: Diagnosis not present

## 2022-11-19 DIAGNOSIS — Z720 Tobacco use: Secondary | ICD-10-CM

## 2022-11-19 DIAGNOSIS — I771 Stricture of artery: Secondary | ICD-10-CM

## 2022-11-19 DIAGNOSIS — R42 Dizziness and giddiness: Secondary | ICD-10-CM

## 2022-11-19 DIAGNOSIS — I739 Peripheral vascular disease, unspecified: Secondary | ICD-10-CM

## 2022-11-19 DIAGNOSIS — I2510 Atherosclerotic heart disease of native coronary artery without angina pectoris: Secondary | ICD-10-CM

## 2022-11-19 DIAGNOSIS — I10 Essential (primary) hypertension: Secondary | ICD-10-CM

## 2022-11-19 NOTE — Progress Notes (Unsigned)
Cardiology Office Note:  .   Date:  11/19/2022  ID:  Robin Walsh, DOB Apr 02, 1966, MRN [id number] PCP: Hoy Register, MD  [facility] Providers Cardiologist:  Donato Schultz, MD PV Cardiologist:  Lorine Bears, MD     History of Present Illness: .   Robin Walsh is a 57 y.o. female with past medical history of CAD, hypertension, hyperlipidemia, tobacco use and history of PAD.  Patient underwent PCI of LAD in 2017 and had bilateral common iliac kissing stent placement in September 2017 for severe claudication symptom.  She had no significant infrainguinal disease.  She developed a recurrent claudication symptom in 2020 due to severe in-stent restenosis.  Preprocedure lab work demonstrated severe anemia with hemoglobin of 7.5 and that the procedure was postponed.  She underwent a EGD and colonoscopy by GI and had a few polyps removed and also had a bleeding anal fissure.  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  She was recently seen in July 2024 due to recurrent severe claudication symptom.  Arterial Doppler showed significant drop in ABI bilaterally and progression of severe in-stent restenosis in bilateral common iliac artery.  She ultimately underwent lower extremity angiography by Dr. Kirke Corin on 10/24/2022 which showed occluded right common iliac artery stent with severe stenosis in the left common iliac artery stent.  Patient underwent complex but successful endovascular intervention to bilateral common iliac artery with placement of 2 kissing covered stents to bilateral common iliac arteries.  Self-expanding stent was added to the right side due to flow-limiting edge dissection.  Postprocedure, patient was recommended to continue dual antiplatelet therapy for 51-month.  ABI obtained on 11/16/2022 showed right ABI has improved from the previous 0.49-1.18, left ABI has improved from  0.53-1.12.  Lower extremity arterial Doppler demonstrated widely patent left common iliac artery stent without evidence of focal stenosis, widely patent bilateral external iliac artery without stenosis.  Patient presents today for follow-up.  She has been able to walk 0.5 to 1 mile per day without any claudication symptoms.  She did smoke 1 time after her lower extremity angiography however has not picked up another cigarette ever since.  I congratulated her on this.  She is complaining of daily episode of dizziness when she gets up too quickly.  She is trying to keep herself hydrated.  I will obtain CBC to make sure she is not anemic.  Orthostatic vital signs obtained in the clinic was negative.  She also wished to obtain a hemoglobin A1c as she has not had one in a long time.  I think this is reasonable given her peripheral arterial disease.  Her most recent LDL was 112 on 10/09/2022, at minimum, her LDL goal should be less than 70 but ideally should be less than 55 if possible.  I initially wanted to refer her to the lipid clinic, however she says she has not been very compliant with the Crestor and wished to have another chance.  I recommended repeat fasting lipid panel and LFT in 61-month.  On physical exam, she has a left carotid bruit and also left subclavian bruit.  Previous carotid Doppler obtained in January 2023 showed significant stenosis in the left subclavian artery.  Her right arm pressure was 144/70, left arm pressure was 110/76, I asked the patient to always get her blood pressure in the right arm.  She denies any left upper extremity claudication symptoms, therefore I do  not think she necessarily need a subclavian artery stenting at this time.  If dizziness get worse in the future, may need to evaluate her for steal syndrome.  Otherwise, she should have a ABI and LEA in 6 months prior to follow-up with Dr. Kirke Corin.  Orthostatic vital sign Lying 132/76 Sitting 134/79 Standing 0-minute  132/73.  Right arm pressure 144/70, left arm pressure 110/76.  ROS: ***  Studies Reviewed: .        *** Risk Assessment/Calculations:   {Does this patient have ATRIAL FIBRILLATION?[phone]}         Physical Exam:   VS:  BP (!) 132/56 (BP Location: Right Arm, Patient Position: Sitting, Cuff Size: Normal)   Pulse (!) 55   Ht 5\' 4"  (1.626 m)   Wt 164 lb 3.2 oz (74.5 kg)   LMP 03/26/2017   SpO2 96%   BMI 28.18 kg/m    Wt Readings from Last 3 Encounters:  11/19/22 164 lb 3.2 oz (74.5 kg)  10/24/22 165 lb (74.8 kg)  10/09/22 164 lb 3.2 oz (74.5 kg)    GEN: Well nourished, well developed in no acute distress NECK: No JVD; No carotid bruits CARDIAC: ***RRR, no murmurs, rubs, gallops RESPIRATORY:  Clear to auscultation without rales, wheezing or rhonchi  ABDOMEN: Soft, non-tender, non-distended EXTREMITIES:  No edema; No deformity   ASSESSMENT AND PLAN: .   ***    {Are you ordering a CV Procedure (e.g. stress test, cath, DCCV, TEE, etc)?   Press F2        :[id number]}  Dispo: ***  Signed, Azalee Course, PA

## 2022-11-19 NOTE — Patient Instructions (Addendum)
Medication Instructions:  NO CHANGES  *If you need a refill on your cardiac medications before your next appointment, please call your pharmacy*   Lab Work: CBC and A1c today   FASTING lipid panel and liver function panel in 3 months  If you have labs (blood work) drawn today and your tests are completely normal, you will receive your results only by: MyChart Message (if you have MyChart) OR A paper copy in the mail If you have any lab test that is abnormal or we need to change your treatment, we will call you to review the results.  Testing:  Carotid Doppler at [facility] office in the next few weeks  ABI and Aorta Iliac IVC in 6 months -- before next visit   Follow-Up: At [facility], you and your health needs are our priority.  As part of our continuing mission to provide you with exceptional heart care, we have created designated Provider Care Teams.  These Care Teams include your primary Cardiologist (physician) and Advanced Practice Providers (APPs -  Physician Assistants and Nurse Practitioners) who all work together to provide you with the care you need, when you need it.  We recommend signing up for the patient portal called "MyChart".  Sign up information is provided on this After Visit Summary.  MyChart is used to connect with patients for Virtual Visits (Telemedicine).  Patients are able to view lab/test results, encounter notes, upcoming appointments, etc.  Non-urgent messages can be sent to your provider as well.   To learn more about what you can do with MyChart, go to [email or web address].    Your next appointment:    6 months with Dr. Kirke Corin

## 2022-11-20 ENCOUNTER — Other Ambulatory Visit: Payer: Self-pay

## 2022-11-20 DIAGNOSIS — I10 Essential (primary) hypertension: Secondary | ICD-10-CM

## 2022-11-20 DIAGNOSIS — Z79899 Other long term (current) drug therapy: Secondary | ICD-10-CM

## 2022-11-20 DIAGNOSIS — N289 Disorder of kidney and ureter, unspecified: Secondary | ICD-10-CM

## 2022-11-20 NOTE — Progress Notes (Signed)
Hemoglobin A1C place Robin Walsh in the prediabetes range. Level essentially unchanged when compare to 6 years ago. Mildly anemic, hemoglobin dropped by almost 2 units when compare to 1 month ago. Please repeat CBC in 1 month to make sure hemoglobin does not continue to drop.

## 2022-11-22 NOTE — Addendum Note (Signed)
Addended by: Sandi Mariscal on: 11/22/2022 08:15 AM   Modules accepted: Orders

## 2022-11-29 ENCOUNTER — Ambulatory Visit: Payer: 59 | Admitting: Physician Assistant

## 2022-11-30 ENCOUNTER — Ambulatory Visit (HOSPITAL_COMMUNITY)
Admission: RE | Admit: 2022-11-30 | Discharge: 2022-11-30 | Disposition: A | Discharge status: Home / Self Care | Payer: 59 | Source: Ambulatory Visit | Attending: Cardiovascular Disease | Admitting: Cardiovascular Disease

## 2022-11-30 DIAGNOSIS — I771 Stricture of artery: Secondary | ICD-10-CM | POA: Insufficient documentation

## 2022-12-12 ENCOUNTER — Other Ambulatory Visit: Payer: Self-pay

## 2022-12-12 ENCOUNTER — Ambulatory Visit: Payer: 59 | Attending: Family Medicine | Admitting: Physician Assistant

## 2022-12-12 ENCOUNTER — Encounter: Payer: Self-pay | Admitting: Physician Assistant

## 2022-12-12 DIAGNOSIS — N951 Menopausal and female climacteric states: Secondary | ICD-10-CM

## 2022-12-12 MED ORDER — OXYBUTYNIN CHLORIDE ER 10 MG PO TB24
10.0000 mg | ORAL_TABLET | Freq: Every day | ORAL | 5 refills | Status: DC
Start: 2022-12-12 — End: 2024-02-03
  Filled 2022-12-12: qty 30, 30d supply, fill #0
  Filled 2023-04-08: qty 30, 30d supply, fill #1

## 2022-12-12 MED ORDER — GABAPENTIN 300 MG PO CAPS
ORAL_CAPSULE | ORAL | 2 refills | Status: DC
Start: 2022-12-12 — End: 2023-04-08
  Filled 2022-12-12: qty 150, 30d supply, fill #0
  Filled 2023-01-31: qty 150, 30d supply, fill #1
  Filled 2023-03-06: qty 150, 30d supply, fill #2

## 2022-12-12 NOTE — Progress Notes (Signed)
Patient ID: Robin Walsh, female   DOB: 1965/05/06, 57 y.o.   MRN: [id number]     Robin Walsh, is a 57 y.o. female  WUJ:[id number]  [id number]  DOB - 1965/05/01  Chief Complaint  Patient presents with   Hot Flashes   labwork    Patient stated she wanted recheck her hemoglobin again. States to also having lightheadedness         Subjective:   Robin Walsh is a 56 y.o. female here today for wanting to try oxybutrin for hot flashes.  Veozah worked well but is too expensive now.    Cardiology visit 8/12 after B iliac artery stents.  She feels great since having this done.:  PAD: Recently underwent placement of 2 kissing covered stents to bilateral common iliac artery.  She has a history of restenosis in the iliac arteries.  I recommended aggressive lipid control and the cessation of tobacco abuse.  She only smoked once since she left the hospital and has finally decided to quit.  Repeat lower extremity ABI and LEA in 6 months prior to visit with Dr. Kirke Corin.   Hyperlipidemia: I recommended aiming for a LDL of less than 55.  I initially recommended referral to lipid clinic, however she says she has not been very compliant with her Crestor and wished to have another chance to try Crestor before lipid clinic referral.  I will repeat fasting lipid panel and LFT in 32-month   Left subclavian artery stenosis: Noticeable difference between left arm pressure and right arm pressure.  She should only obtain blood pressure in the right arm.  She has no claudication symptoms, therefore does not need any intervention for the left subclavian artery stenosis at this time.  I did recommend a repeat carotid Doppler to take a look at the severity of left subclavian artery stenosis   CAD: Denies any chest pain   Hypertension: Blood pressure stable.  She says she has not been taking carvedilol.   Hyperlipidemia: On Crestor   Tobacco abuse: She did smoke once after she left the hospital, however  decided to quit since then.  I congratulated her on this   Dizziness: Orthostatic vital sign was negative even though her symptom is clearly orthostatic in nature.  If dizziness worsens, may need to be evaluated for steal syndrome due to significant left subclavian artery stenosis. No problems updated.  ALLERGIES: Allergies  Allergen Reactions   Chantix [Varenicline Tartrate] Nausea Only   Wellbutrin [Bupropion] Itching   Codeine Nausea And Vomiting   Dilaudid [Hydromorphone Hcl] Nausea And Vomiting    PAST MEDICAL HISTORY: Past Medical History:  Diagnosis Date   Anal fissure    Anemia    CAD (coronary artery disease)    Hyperlipidemia    Hypertension     MEDICATIONS AT HOME: Prior to Admission medications   Medication Sig Start Date End Date Taking? Authorizing Provider  albuterol (VENTOLIN HFA) 108 (90 Base) MCG/ACT inhaler Inhale 1-2 puffs into the lungs every 6 (six) hours as needed for wheezing or shortness of breath. 07/11/22  Yes Hoy Register, MD  aspirin 81 MG chewable tablet Chew 1 tablet (81 mg total) by mouth daily. Patient taking differently: Chew 81 mg by mouth every evening. 02/26/17  Yes Hoy Register, MD  clopidogrel (PLAVIX) 75 MG tablet Take 1 tablet (75 mg total) by mouth daily. 10/24/22 10/24/23 Yes Iran Ouch, MD  lisinopril (ZESTRIL) 10 MG tablet Take 1 tablet (10 mg total) by mouth daily. 10/24/22  Yes Iran Ouch, MD  nitroGLYCERIN (NITROSTAT) 0.4 MG SL tablet Place 1 tablet (0.4 mg total) under the tongue every 5 (five) minutes as needed for chest pain. X 3 doses 11/27/21  Yes Newlin, Enobong, MD  oxybutynin (DITROPAN-XL) 10 MG 24 hr tablet Take 1 tablet (10 mg total) by mouth at bedtime. 12/12/22  Yes Georgian Co M, PA-C  rosuvastatin (CRESTOR) 40 MG tablet Take 1 tablet (40 mg total) by mouth daily. 10/08/22  Yes Hoy Register, MD  gabapentin (NEURONTIN) 300 MG capsule Take 2 capsules (600 mg total) by mouth every morning AND 3 capsules (900  mg total) every evening. 12/12/22   Dannielle Baskins, Marzella Schlein, PA-C    ROS: Neg HEENT Neg resp Neg cardiac Neg GI Neg GU Neg MS Neg psych Neg neuro  Objective:   Vitals:   12/12/22 0850  BP: 128/78  Pulse: 94  SpO2: 100%  Weight: 159 lb 6.4 oz (72.3 kg)   Exam General appearance : Awake, alert, not in any distress. Speech Clear. Not toxic looking HEENT: Atraumatic and Normocephalic Neck: Supple, no JVD. No cervical lymphadenopathy.  Chest: Good air entry bilaterally, CTAB.  No rales/rhonchi/wheezing CVS: S1 S2 regular, no murmurs.  Extremities: B/L Lower Ext shows no edema, both legs are warm to touch Neurology: Awake alert, and oriented X 3, CN II-XII intact, Non focal Skin: No Rash  Data Review Lab Results  Component Value Date   HGBA1C 5.9 (H) 11/19/2022   HGBA1C 5.8 (H) 11/27/2015    Assessment & Plan   1. Menopausal vasomotor syndrome Can try- - gabapentin (NEURONTIN) 300 MG capsule; Take 2 capsules (600 mg total) by mouth every morning AND 3 capsules (900 mg total) every evening.  Dispense: 150 capsule; Refill: 2 - oxybutynin (DITROPAN-XL) 10 MG 24 hr tablet; Take 1 tablet (10 mg total) by mouth at bedtime.  Dispense: 30 tablet; Refill: 5    Return in about 4 months (around 04/13/2023) for Dr Alvis Lemmings , PCP for chronic conditions.  The patient was given clear instructions to go to ER or return to medical center if symptoms don't improve, worsen or new problems develop. The patient verbalized understanding. The patient was told to call to get lab results if they haven't heard anything in the next week.      Georgian Co, PA-C [facility] [place], [place] [phone]   12/12/2022, 9:15 AM

## 2022-12-12 NOTE — Patient Instructions (Signed)
Drink 64 to 80 ounces water daily

## 2022-12-13 ENCOUNTER — Other Ambulatory Visit: Payer: Self-pay

## 2023-01-23 ENCOUNTER — Ambulatory Visit: Payer: 59 | Attending: Family Medicine | Admitting: Family Medicine

## 2023-01-23 ENCOUNTER — Encounter: Payer: Self-pay | Admitting: Family Medicine

## 2023-01-23 VITALS — BP 123/69 | HR 86 | Ht 64.0 in | Wt 159.4 lb

## 2023-01-23 DIAGNOSIS — R1319 Other dysphagia: Secondary | ICD-10-CM | POA: Diagnosis not present

## 2023-01-23 DIAGNOSIS — M5432 Sciatica, left side: Secondary | ICD-10-CM

## 2023-01-23 NOTE — Progress Notes (Signed)
Subjective:  Patient ID: Robin Walsh, female    DOB: 27-May-1965  Age: 57 y.o. MRN: [id number]  CC: trouble swallowing   HPI Robin Walsh is a 57 y.o. year old female with a history of tobacco abuse ( 1 ppd since the age of 58 quit in 10/2022), coronary artery disease (status post PCI and drug-eluting stent placement to LAD in 11/2015) peripheral arterial disease (status post stenting of bilateral common iliac artery), abdominal aortogram with bilateral peripheral vascular balloon angioplasty of bilateral common iliac with placement of 2 kissing covered stents in 10/2022.  Interval History: Discussed the use of AI scribe software for clinical note transcription with the patient, who gave verbal consent to proceed.  She presents with a two-week history of difficulty swallowing. She describes the sensation as 'swallowing over a burp,' with a feeling of air in the upper chest. She reports a 'double swallow' sensation, leading to frequent burping and belching. She denies regurgitation, burning, or acid taste, suggesting it is not acid reflux. She also reports a stinging sensation in the ear when swallowing, which worsens as the day progresses. The difficulty swallowing is not associated with pain and occurs with both solids and liquids. She has a history of an upper endoscopy in 2020, which was normal.  The patient also reports sciatic pain that is triggered by walking and has been severe enough to bring her to tears. She denies current pain at rest. She has been losing weight since the placement of her stents and has quit smoking since July. She reports some depressive symptoms but denies suicidal ideation. She has tried Cymbalta in the past but found it ineffective.        Past Medical History:  Diagnosis Date   Anal fissure    Anemia    CAD (coronary artery disease)    Hyperlipidemia    Hypertension     Past Surgical History:  Procedure Laterality Date   ABDOMINAL AORTOGRAM W/LOWER  EXTREMITY N/A 01/14/2019   Procedure: ABDOMINAL AORTOGRAM W/LOWER EXTREMITY;  Surgeon: Iran Ouch, MD;  Location: [facility];  Service: Cardiovascular;  Laterality: N/A;  limited   ABDOMINAL AORTOGRAM W/LOWER EXTREMITY N/A 10/24/2022   Procedure: ABDOMINAL AORTOGRAM W/LOWER EXTREMITY;  Surgeon: Iran Ouch, MD;  Location: [facility];  Service: Cardiovascular;  Laterality: N/A;   CARDIAC CATHETERIZATION N/A 11/28/2015   Procedure: Left Heart Cath and Coronary Angiography;  Surgeon: Corky Crafts, MD;  Location: [facility] INVASIVE CV LAB;  Service: Cardiovascular;  Laterality: N/A;   CARDIAC CATHETERIZATION N/A 11/28/2015   Procedure: Coronary Stent Intervention;  Surgeon: Corky Crafts, MD;  Location: [facility] INVASIVE CV LAB;  Service: Cardiovascular;  Laterality: N/A;  DES to Mid LAD; 3.0x12 Synergy   LEG SURGERY Left 2002?   tibial plateau fracture   PERIPHERAL VASCULAR BALLOON ANGIOPLASTY Bilateral 01/14/2019   Procedure: PERIPHERAL VASCULAR BALLOON ANGIOPLASTY;  Surgeon: Iran Ouch, MD;  Location: [facility];  Service: Cardiovascular;  Laterality: Bilateral;  common iliac   PERIPHERAL VASCULAR BALLOON ANGIOPLASTY Bilateral 10/24/2022   Procedure: PERIPHERAL VASCULAR BALLOON ANGIOPLASTY;  Surgeon: Iran Ouch, MD;  Location: [facility];  Service: Cardiovascular;  Laterality: Bilateral;  L and R common iliac   PERIPHERAL VASCULAR CATHETERIZATION N/A 01/04/2016   Procedure: Abdominal Aortogram;  Surgeon: Yvonne Kendall, MD;  [facility]: [facility];  Service: Cardiovascular;  Laterality: N/A;   PERIPHERAL VASCULAR CATHETERIZATION Bilateral 01/04/2016   Procedure: Lower Extremity Angiography;  Surgeon: Yvonne Kendall, MD;  Location: [facility] INVASIVE CV LAB;  Service: Cardiovascular;  Laterality: Bilateral;   PERIPHERAL VASCULAR CATHETERIZATION Bilateral 01/04/2016   Procedure: Peripheral Vascular Intervention;  Surgeon: Yvonne Kendall, MD;   [facility]: [facility];  Service: Cardiovascular;  Laterality: Bilateral;  common iliac   PERIPHERAL VASCULAR INTERVENTION Bilateral 10/24/2022   Procedure: PERIPHERAL VASCULAR INTERVENTION;  Surgeon: Iran Ouch, MD;  Location: [facility];  Service: Cardiovascular;  Laterality: Bilateral;  R and L common iliac    Family History  Problem Relation Age of Onset   Congestive Heart Failure Mother 34   Alcohol abuse Father 6   Heart disease Brother    Liver disease Sister        kidney/liver    Social History   Socioeconomic History   Marital status: Single    Spouse name: Not on file   Number of children: 0   Years of education: Not on file   Highest education level: Not on file  Occupational History   Occupation: clerical work  Tobacco Use   Smoking status: Every Day    Current packs/day: 0.50    Average packs/day: 0.5 packs/day for 25.0 years (12.5 ttl pk-yrs)    Types: Cigarettes   Smokeless tobacco: Never  Vaping Use   Vaping status: Never Used  Substance and Sexual Activity   Alcohol use: No   Drug use: Yes    Types: Marijuana    Comment: one week ago   Sexual activity: Not on file  Other Topics Concern   Not on file  Social History Narrative   Not on file   Social Determinants of Health   Financial Resource Strain: Not on file  Food Insecurity: Not on file  Transportation Needs: Not on file  Physical Activity: Not on file  Stress: Not on file  Social Connections: Not on file    Allergies  Allergen Reactions   Chantix [Varenicline Tartrate] Nausea Only   Wellbutrin [Bupropion] Itching   Codeine Nausea And Vomiting   Dilaudid [Hydromorphone Hcl] Nausea And Vomiting    Outpatient Medications Prior to Visit  Medication Sig Dispense Refill   albuterol (VENTOLIN HFA) 108 (90 Base) MCG/ACT inhaler Inhale 1-2 puffs into the lungs every 6 (six) hours as needed for wheezing or shortness of breath. 8.5 g 2   aspirin 81 MG chewable tablet Chew  1 tablet (81 mg total) by mouth daily. (Patient taking differently: Chew 81 mg by mouth every evening.) 30 tablet 11   clopidogrel (PLAVIX) 75 MG tablet Take 1 tablet (75 mg total) by mouth daily. 90 tablet 3   gabapentin (NEURONTIN) 300 MG capsule Take 2 capsules (600 mg total) by mouth every morning AND 3 capsules (900 mg total) every evening. 150 capsule 2   lisinopril (ZESTRIL) 10 MG tablet Take 1 tablet (10 mg total) by mouth daily. 30 tablet 6   nitroGLYCERIN (NITROSTAT) 0.4 MG SL tablet Place 1 tablet (0.4 mg total) under the tongue every 5 (five) minutes as needed for chest pain. X 3 doses 25 tablet 1   oxybutynin (DITROPAN-XL) 10 MG 24 hr tablet Take 1 tablet (10 mg total) by mouth at bedtime. 30 tablet 5   rosuvastatin (CRESTOR) 40 MG tablet Take 1 tablet (40 mg total) by mouth daily. 90 tablet 1   No facility-administered medications prior to visit.     ROS Review of Systems  Constitutional:  Negative for activity change and appetite change.  HENT:  Negative  for sinus pressure and sore throat.   Respiratory:  Negative for chest tightness, shortness of breath and wheezing.   Cardiovascular:  Negative for chest pain and palpitations.  Gastrointestinal:  Negative for abdominal distention, abdominal pain and constipation.  Genitourinary: Negative.   Musculoskeletal:        See Hpi  Psychiatric/Behavioral:  Negative for behavioral problems and dysphoric mood.     Objective:  BP 123/69   Pulse 86   Ht 5\' 4"  (1.626 m)   Wt 159 lb 6.4 oz (72.3 kg)   LMP 03/26/2017   SpO2 98%   BMI 27.36 kg/m      01/23/2023    9:53 AM 12/12/2022    8:50 AM 11/19/2022    3:26 PM  BP/Weight  Systolic BP 123 128 132  Diastolic BP 69 78 56  Wt. (Lbs) 159.4 159.4 164.2  BMI 27.36 kg/m2 27.36 kg/m2 28.18 kg/m2      Physical Exam Constitutional:      Appearance: She is well-developed.  Cardiovascular:     Rate and Rhythm: Normal rate.     Heart sounds: Normal heart sounds. No murmur  heard. Pulmonary:     Effort: Pulmonary effort is normal.     Breath sounds: Normal breath sounds. No wheezing or rales.  Chest:     Chest wall: No tenderness.  Abdominal:     General: Bowel sounds are normal. There is no distension.     Palpations: Abdomen is soft. There is no mass.     Tenderness: There is no abdominal tenderness.  Musculoskeletal:        General: Normal range of motion.     Right lower leg: No edema.     Left lower leg: No edema.  Neurological:     Mental Status: She is alert and oriented to person, place, and time.  Psychiatric:        Mood and Affect: Mood normal.        Latest Ref Rng & Units 10/09/2022   11:17 AM 05/30/2022   10:11 AM 11/27/2021    9:24 AM  CMP  Glucose 70 - 99 mg/dL 956  387  91   BUN 6 - 24 mg/dL 11  10  12    Creatinine 0.57 - 1.00 mg/dL 5.64  3.32  9.51   Sodium 134 - 144 mmol/L 138  140  141   Potassium 3.5 - 5.2 mmol/L 4.9  5.2  5.0   Chloride 96 - 106 mmol/L 103  103  104   CO2 20 - 29 mmol/L 22  23  23    Calcium 8.7 - 10.2 mg/dL 9.6  9.6  88.4   Total Protein 6.0 - 8.5 g/dL 7.0  7.2    Total Bilirubin 0.0 - 1.2 mg/dL 0.3  0.3    Alkaline Phos 44 - 121 IU/L 94  86    AST 0 - 40 IU/L 12  12    ALT 0 - 32 IU/L 8  10      Lipid Panel     Component Value Date/Time   CHOL 182 10/09/2022 1117   TRIG 242 (H) 10/09/2022 1117   HDL 28 (L) 10/09/2022 1117   CHOLHDL 6.5 (H) 10/09/2022 1117   CHOLHDL 7.8 11/27/2015 0453   VLDL 66 (H) 11/27/2015 0453   LDLCALC 112 (H) 10/09/2022 1117    CBC    Component Value Date/Time   WBC 11.8 (H) 11/19/2022 1647   WBC 9.4 10/24/2018 0533  RBC 3.68 (L) 11/19/2022 1647   RBC 4.07 10/24/2018 0533   HGB 10.5 (L) 11/19/2022 1647   HCT 32.7 (L) 11/19/2022 1647   PLT 415 11/19/2022 1647   MCV 89 11/19/2022 1647   MCH 28.5 11/19/2022 1647   MCH 21.4 (L) 10/24/2018 0533   MCHC 32.1 11/19/2022 1647   MCHC 29.3 (L) 10/24/2018 0533   RDW 13.6 11/19/2022 1647   LYMPHSABS 2.6 04/26/2021 1418    MONOABS 0.7 10/23/2018 1406   EOSABS 0.5 (H) 04/26/2021 1418   BASOSABS 0.0 04/26/2021 1418    Lab Results  Component Value Date   HGBA1C 5.9 (H) 11/19/2022    Assessment & Plan:      Difficulty Swallowing Described as feeling like swallowing over a burp, with a sensation of air in the throat and a double swallow. No associated pain or regurgitation. No associated abdominal symptoms. Previous endoscopy in 2020 was normal. -Order Barium Swallow to assess esophageal motility and structure. -Depending on results, consider referral to Gastroenterology.  Sciatica Reports pain in the sciatic nerve during walking, leading to altered gait. Pain is not constant and seems to be triggered by activity. -Refer to Physical Therapy for targeted exercises and management.  Depressive Symptoms Reports feeling depressed, but not suicidal. Previous trial of Cymbalta was not effective. -Declines initiation of medication -Monitor depressive symptoms closely.   Tobacco Use Quit smoking in July 2024, following stent placement. -Encourage continued abstinence from smoking to prevent stent occlusion.           No orders of the defined types were placed in this encounter.   Follow-up: Return for previously scheduled appointment.       Hoy Register, MD, FAAFP. [facility] [place], [place] [phone]   01/23/2023, 10:36 AM

## 2023-01-23 NOTE — Patient Instructions (Signed)

## 2023-01-31 ENCOUNTER — Other Ambulatory Visit: Payer: Self-pay

## 2023-02-04 ENCOUNTER — Other Ambulatory Visit: Payer: Self-pay

## 2023-03-06 ENCOUNTER — Other Ambulatory Visit: Payer: Self-pay

## 2023-03-08 ENCOUNTER — Other Ambulatory Visit: Payer: Self-pay

## 2023-04-08 ENCOUNTER — Other Ambulatory Visit: Payer: Self-pay | Admitting: Family Medicine

## 2023-04-08 ENCOUNTER — Other Ambulatory Visit: Payer: Self-pay

## 2023-04-08 DIAGNOSIS — N951 Menopausal and female climacteric states: Secondary | ICD-10-CM

## 2023-04-08 MED ORDER — GABAPENTIN 300 MG PO CAPS
ORAL_CAPSULE | ORAL | 2 refills | Status: DC
Start: 2023-04-08 — End: 2023-07-09
  Filled 2023-04-08: qty 150, 30d supply, fill #0
  Filled 2023-04-30 – 2023-05-02 (×2): qty 150, 30d supply, fill #1
  Filled 2023-06-10: qty 150, 30d supply, fill #2

## 2023-04-09 ENCOUNTER — Other Ambulatory Visit: Payer: Self-pay

## 2023-05-01 ENCOUNTER — Other Ambulatory Visit: Payer: Self-pay

## 2023-05-02 ENCOUNTER — Other Ambulatory Visit: Payer: Self-pay

## 2023-06-10 ENCOUNTER — Other Ambulatory Visit: Payer: Self-pay

## 2023-07-09 ENCOUNTER — Other Ambulatory Visit: Payer: Self-pay | Admitting: Family Medicine

## 2023-07-09 ENCOUNTER — Other Ambulatory Visit: Payer: Self-pay

## 2023-07-09 DIAGNOSIS — N951 Menopausal and female climacteric states: Secondary | ICD-10-CM

## 2023-07-10 ENCOUNTER — Other Ambulatory Visit: Payer: Self-pay

## 2023-07-10 MED ORDER — GABAPENTIN 300 MG PO CAPS
ORAL_CAPSULE | ORAL | 2 refills | Status: DC
  Filled 2023-07-10: qty 150, 30d supply, fill #0
  Filled 2023-08-05 – 2023-08-16 (×2): qty 150, 30d supply, fill #1
  Filled 2023-10-04: qty 150, 30d supply, fill #2

## 2023-07-11 ENCOUNTER — Other Ambulatory Visit: Payer: Self-pay

## 2023-07-12 ENCOUNTER — Ambulatory Visit (INDEPENDENT_AMBULATORY_CARE_PROVIDER_SITE_OTHER)

## 2023-07-12 ENCOUNTER — Ambulatory Visit
Admission: EM | Admit: 2023-07-12 | Discharge: 2023-07-12 | Disposition: A | Discharge status: Home / Self Care | Attending: Internal Medicine | Admitting: Internal Medicine

## 2023-07-12 DIAGNOSIS — R1032 Left lower quadrant pain: Secondary | ICD-10-CM | POA: Diagnosis not present

## 2023-07-12 DIAGNOSIS — R1030 Lower abdominal pain, unspecified: Secondary | ICD-10-CM

## 2023-07-12 LAB — POCT URINALYSIS DIP (MANUAL ENTRY)
Bilirubin, UA: NEGATIVE
Blood, UA: NEGATIVE
Glucose, UA: NEGATIVE mg/dL
Ketones, POC UA: NEGATIVE mg/dL
Leukocytes, UA: NEGATIVE
Nitrite, UA: NEGATIVE
Protein Ur, POC: NEGATIVE mg/dL
Spec Grav, UA: 1.01 (ref 1.010–1.025)
Urobilinogen, UA: 1 U/dL
pH, UA: 6 (ref 5.0–8.0)

## 2023-07-12 NOTE — ED Triage Notes (Signed)
 Pt presents with LLQ pain x 3 days. Pt denies emesis and diarrhea but does complain of mild gas.

## 2023-07-12 NOTE — Discharge Instructions (Signed)
 X-ray pending.  Will call if it is abnormal.  Urine test normal. Blood work pending.  Will call if it is abnormal.  Please follow-up with primary care doctor.  Go to the ER if symptoms persist or worsen.

## 2023-07-12 NOTE — ED Provider Notes (Signed)
 [facility]    CSN: [id number] Arrival date & time: 07/12/23  1838      History   Chief Complaint Chief Complaint  Patient presents with   Abdominal Pain    HPI Robin Walsh is a 58 y.o. female.   Patient presents with lower abdominal pain mainly on the left lower side that has been intermittent for the past 3 days.  Patient reports that it feels like there is "gas trapped" in that area.  She also reports that it intermittently causes some cramping in the lower abdominal area that radiates to the bilateral sides of her back.  Last bowel movement was yesterday which she reports was normal.  Denies nausea or vomiting.  Denies blood in stool.  Denies fever, body aches, chills.  Patient is urinating appropriately and denies dysuria, urinary frequency, hematuria.  Denies any vaginal itching or discharge and reports that she is not sexually active.  Has taken Linzess, ibuprofen, and gas medication.  Reports that Linzess is a previously prescribed medication.  Denies any history of chronic gastrointestinal problems.  Denies any history of gynecological chronic illnesses.   Abdominal Pain   Past Medical History:  Diagnosis Date   Anal fissure    Anemia    CAD (coronary artery disease)    Hyperlipidemia    Hypertension     Patient Active Problem List   Diagnosis Date Noted   Symptomatic anemia 10/23/2018   Hemorrhoids 10/23/2018   Leukocytosis 10/23/2018   Renal insufficiency 10/23/2018   Tobacco abuse 10/23/2018   Vitamin D deficiency 02/11/2018   Hot flash, menopausal 02/06/2017   PAD (peripheral artery disease) (HCC) 01/04/2016   Medication management 12/09/2015   Chest pain    Abnormal finding on EKG - anterior T-wave inversions concerning for LAD ischemia 11/28/2015   CAD S/P DES PCI TO mLAD - Synergy DES 3.0 mm x 12 mm 11/28/2015   Unstable angina (HCC) 11/27/2015   Essential hypertension 11/27/2015   Dyslipidemia, goal LDL below 70 11/27/2015   Tobacco  dependence 11/27/2015   Noncompliance 11/27/2015   Marijuana dependence (HCC) 11/27/2015   Right hip pain 11/27/2015    Past Surgical History:  Procedure Laterality Date   ABDOMINAL AORTOGRAM W/LOWER EXTREMITY N/A 01/14/2019   Procedure: ABDOMINAL AORTOGRAM W/LOWER EXTREMITY;  Surgeon: Iran Ouch, MD;  Location: [facility];  Service: Cardiovascular;  Laterality: N/A;  limited   ABDOMINAL AORTOGRAM W/LOWER EXTREMITY N/A 10/24/2022   Procedure: ABDOMINAL AORTOGRAM W/LOWER EXTREMITY;  Surgeon: Iran Ouch, MD;  Location: [facility];  Service: Cardiovascular;  Laterality: N/A;   CARDIAC CATHETERIZATION N/A 11/28/2015   Procedure: Left Heart Cath and Coronary Angiography;  Surgeon: Corky Crafts, MD;  Location: [facility] INVASIVE CV LAB;  Service: Cardiovascular;  Laterality: N/A;   CARDIAC CATHETERIZATION N/A 11/28/2015   Procedure: Coronary Stent Intervention;  Surgeon: Corky Crafts, MD;  Location: [facility] INVASIVE CV LAB;  Service: Cardiovascular;  Laterality: N/A;  DES to Mid LAD; 3.0x12 Synergy   LEG SURGERY Left 2002?   tibial plateau fracture   PERIPHERAL VASCULAR BALLOON ANGIOPLASTY Bilateral 01/14/2019   Procedure: PERIPHERAL VASCULAR BALLOON ANGIOPLASTY;  Surgeon: Iran Ouch, MD;  Location: [facility];  Service: Cardiovascular;  Laterality: Bilateral;  common iliac   PERIPHERAL VASCULAR BALLOON ANGIOPLASTY Bilateral 10/24/2022   Procedure: PERIPHERAL VASCULAR BALLOON ANGIOPLASTY;  Surgeon: Iran Ouch, MD;  Location: [facility];  Service: Cardiovascular;  Laterality: Bilateral;  L and R  common iliac   PERIPHERAL VASCULAR CATHETERIZATION N/A 01/04/2016   Procedure: Abdominal Aortogram;  Surgeon: Yvonne Kendall, MD;  Location: [facility];  Service: Cardiovascular;  Laterality: N/A;   PERIPHERAL VASCULAR CATHETERIZATION Bilateral 01/04/2016   Procedure: Lower Extremity Angiography;  Surgeon: Yvonne Kendall, MD;  Location: [facility]  INVASIVE CV LAB;  Service: Cardiovascular;  Laterality: Bilateral;   PERIPHERAL VASCULAR CATHETERIZATION Bilateral 01/04/2016   Procedure: Peripheral Vascular Intervention;  Surgeon: Yvonne Kendall, MD;  Location: [facility];  Service: Cardiovascular;  Laterality: Bilateral;  common iliac   PERIPHERAL VASCULAR INTERVENTION Bilateral 10/24/2022   Procedure: PERIPHERAL VASCULAR INTERVENTION;  Surgeon: Iran Ouch, MD;  Location: [facility];  Service: Cardiovascular;  Laterality: Bilateral;  R and L common iliac    OB History     Gravida  1   Para  0   Term      Preterm      AB  1   Living         SAB      IAB  1   Ectopic      Multiple      Live Births               Home Medications    Prior to Admission medications   Medication Sig Start Date End Date Taking? Authorizing Provider  aspirin 81 MG chewable tablet Chew 1 tablet (81 mg total) by mouth daily. Patient taking differently: Chew 81 mg by mouth every evening. 02/26/17  Yes Hoy Register, MD  clopidogrel (PLAVIX) 75 MG tablet Take 1 tablet (75 mg total) by mouth daily. 10/24/22 10/24/23 Yes Iran Ouch, MD  gabapentin (NEURONTIN) 300 MG capsule Take 2 capsules (600 mg total) by mouth every morning AND 3 capsules (900 mg total) every evening. 07/10/23  Yes Hoy Register, MD  lisinopril (ZESTRIL) 10 MG tablet Take 1 tablet (10 mg total) by mouth daily. 10/24/22  Yes Iran Ouch, MD  rosuvastatin (CRESTOR) 40 MG tablet Take 1 tablet (40 mg total) by mouth daily. 10/08/22  Yes Hoy Register, MD  albuterol (VENTOLIN HFA) 108 (90 Base) MCG/ACT inhaler Inhale 1-2 puffs into the lungs every 6 (six) hours as needed for wheezing or shortness of breath. 07/11/22   Hoy Register, MD  nitroGLYCERIN (NITROSTAT) 0.4 MG SL tablet Place 1 tablet (0.4 mg total) under the tongue every 5 (five) minutes as needed for chest pain. X 3 doses 11/27/21   Hoy Register, MD  oxybutynin (DITROPAN-XL) 10 MG  24 hr tablet Take 1 tablet (10 mg total) by mouth at bedtime. 12/12/22   Anders Simmonds, PA-C    Family History Family History  Problem Relation Age of Onset   Congestive Heart Failure Mother 42   Alcohol abuse Father 60   Heart disease Brother    Liver disease Sister        kidney/liver    Social History Social History   Tobacco Use   Smoking status: Every Day    Current packs/day: 0.50    Average packs/day: 0.5 packs/day for 25.0 years (12.5 ttl pk-yrs)    Types: Cigarettes   Smokeless tobacco: Never  Vaping Use   Vaping status: Never Used  Substance Use Topics   Alcohol use: No   Drug use: Yes    Types: Marijuana    Comment: one week ago     Allergies   Chantix [varenicline tartrate], Wellbutrin [bupropion], Codeine, and Dilaudid [hydromorphone hcl]  Review of Systems Review of Systems Per HPI  Physical Exam Triage Vital Signs ED Triage Vitals  Encounter Vitals Group     BP 07/12/23 1902 115/69     Systolic BP Percentile --      Diastolic BP Percentile --      Pulse Rate 07/12/23 1902 (!) 101     Resp 07/12/23 1902 18     Temp 07/12/23 1902 98.8 F (37.1 C)     Temp Source 07/12/23 1902 Oral     SpO2 07/12/23 1902 97 %     Weight --      Height --      Head Circumference --      Peak Walsh --      Pain Score 07/12/23 1859 3     Pain Loc --      Pain Education --      Exclude from Growth Chart --    No data found.  Updated Vital Signs BP 115/69 (BP Location: Right Arm)   Pulse (!) 101   Temp 98.8 F (37.1 C) (Oral)   Resp 18   LMP 03/26/2017   SpO2 97%   Visual Acuity Right Eye Distance:   Left Eye Distance:   Bilateral Distance:    Right Eye Near:   Left Eye Near:    Bilateral Near:     Physical Exam Constitutional:      General: She is not in acute distress.    Appearance: Normal appearance. She is not toxic-appearing or diaphoretic.     Comments: Patient is sitting upright in chair in no acute distress.  HENT:     Head:  Normocephalic and atraumatic.  Eyes:     Extraocular Movements: Extraocular movements intact.     Conjunctiva/sclera: Conjunctivae normal.  Cardiovascular:     Rate and Rhythm: Normal rate and regular rhythm.     Pulses: Normal pulses.     Heart sounds: Normal heart sounds.  Pulmonary:     Effort: Pulmonary effort is normal. No respiratory distress.     Breath sounds: Normal breath sounds.  Abdominal:     General: Bowel sounds are normal. There is no distension.     Palpations: Abdomen is soft.     Tenderness: There is abdominal tenderness.     Comments: Mild tenderness to palpation across lower abdomen.  Neurological:     General: No focal deficit present.     Mental Status: She is alert and oriented to person, place, and time. Mental status is at baseline.  Psychiatric:        Mood and Affect: Mood normal.        Behavior: Behavior normal.        Thought Content: Thought content normal.        Judgment: Judgment normal.      UC Treatments / Results  Labs (all labs ordered are listed, but only abnormal results are displayed) Labs Reviewed  CBC  COMPREHENSIVE METABOLIC PANEL WITH GFR  POCT URINALYSIS DIP (MANUAL ENTRY)    EKG   Radiology DG Abd 2 Views Result Date: 07/12/2023 CLINICAL DATA:  Left lower quadrant abdominal pain for 3 days. EXAM: ABDOMEN - 2 VIEW COMPARISON:  None Available. FINDINGS: The bowel gas pattern is normal. There is no evidence of free air. No radio-opaque calculi. Vascular stents are noted in the region of the common iliac arteries. IMPRESSION: Nonobstructive bowel-gas pattern. Electronically Signed   By: Thornell Sartorius M.D.   On: 07/12/2023 20:13  Procedures Procedures (including critical care time)  Medications Ordered in UC Medications - No data to display  Initial Impression / Assessment and Plan / UC Course  I have reviewed the triage vital signs and the nursing notes.  Pertinent labs & imaging results that were available during my  care of the patient were reviewed by me and considered in my medical decision making (see chart for details).     Discussed with patient limited resources here in urgent care to evaluate abdominal pain.  Recommended the patient go to the ER as she may need imaging of the abdomen and stat blood work.  She declined ER evaluation and wished to have limited workup here in urgent care.  Risks associated with not going to the ER were discussed with patient.  Patient voiced understanding and accepted risks.  UA completed today which was normal.  Abdominal x-ray was unremarkable as well.  Awaiting CMP and CBC.  I am not sure exact etiology of patient's abdominal pain but differential diagnoses include diverticulitis versus constipation versus ovarian cyst versus pain caused by gas. Low suspicion for diverticulitis given physical exam. Advised patient to follow-up with her PCP as soon as possible for further testing.  Advised supportive care in the meantime, fluids, rest, bland diet.  Patient to go to the ER if abdominal pain persists or worsens.  Patient verbalized understanding and was agreeable with plan.   Called patient and discussed x-ray results.  Patient states that she feels much better this morning.  Advised PCP follow-up and strict ER precautions. Final Clinical Impressions(s) / UC Diagnoses   Final diagnoses:  Lower abdominal pain     Discharge Instructions      X-ray pending.  Will call if it is abnormal.  Urine test normal. Blood work pending.  Will call if it is abnormal.  Please follow-up with primary care doctor.  Go to the ER if symptoms persist or worsen.     ED Prescriptions   None    PDMP not reviewed this encounter.   Gustavus Bryant, [place] 07/13/23 [phone]

## 2023-07-14 LAB — CBC
Hematocrit: 29.8 % — ABNORMAL LOW (ref 34.0–46.6)
Hemoglobin: 7.9 g/dL — ABNORMAL LOW (ref 11.1–15.9)
MCH: 20.7 pg — ABNORMAL LOW (ref 26.6–33.0)
MCHC: 26.5 g/dL — ABNORMAL LOW (ref 31.5–35.7)
MCV: 78 fL — ABNORMAL LOW (ref 79–97)
Platelets: 543 10*3/uL — ABNORMAL HIGH (ref 150–450)
RBC: 3.81 x10E6/uL (ref 3.77–5.28)
RDW: 16.5 % — ABNORMAL HIGH (ref 11.7–15.4)
WBC: 12.2 10*3/uL — ABNORMAL HIGH (ref 3.4–10.8)

## 2023-07-14 LAB — COMPREHENSIVE METABOLIC PANEL WITH GFR
ALT: 5 IU/L (ref 0–32)
AST: 10 IU/L (ref 0–40)
Albumin: 4 g/dL (ref 3.8–4.9)
Alkaline Phosphatase: 101 IU/L (ref 44–121)
BUN/Creatinine Ratio: 11 (ref 9–23)
BUN: 12 mg/dL (ref 6–24)
Bilirubin Total: <0.2 mg/dL (ref 0.0–1.2)
CO2: 18 mmol/L — ABNORMAL LOW (ref 20–29)
Calcium: 9.3 mg/dL (ref 8.7–10.2)
Chloride: 103 mmol/L (ref 96–106)
Creatinine, Ser: 1.12 mg/dL — ABNORMAL HIGH (ref 0.57–1.00)
Globulin, Total: 3.4 g/dL (ref 1.5–4.5)
Glucose: 93 mg/dL (ref 70–99)
Potassium: 4 mmol/L (ref 3.5–5.2)
Sodium: 137 mmol/L (ref 134–144)
Total Protein: 7.4 g/dL (ref 6.0–8.5)
eGFR: 58 mL/min/{1.73_m2} — ABNORMAL LOW (ref >59)

## 2023-07-15 ENCOUNTER — Other Ambulatory Visit: Payer: Self-pay

## 2023-07-16 ENCOUNTER — Other Ambulatory Visit: Payer: Self-pay

## 2023-07-16 ENCOUNTER — Ambulatory Visit: Attending: Family Medicine | Admitting: Family Medicine

## 2023-07-16 VITALS — BP 119/67 | HR 98 | Ht 64.0 in | Wt 159.4 lb

## 2023-07-16 DIAGNOSIS — D508 Other iron deficiency anemias: Secondary | ICD-10-CM | POA: Diagnosis not present

## 2023-07-16 DIAGNOSIS — I2510 Atherosclerotic heart disease of native coronary artery without angina pectoris: Secondary | ICD-10-CM | POA: Diagnosis not present

## 2023-07-16 DIAGNOSIS — R1032 Left lower quadrant pain: Secondary | ICD-10-CM

## 2023-07-16 DIAGNOSIS — K602 Anal fissure, unspecified: Secondary | ICD-10-CM

## 2023-07-16 DIAGNOSIS — I739 Peripheral vascular disease, unspecified: Secondary | ICD-10-CM

## 2023-07-16 DIAGNOSIS — R1319 Other dysphagia: Secondary | ICD-10-CM | POA: Diagnosis not present

## 2023-07-16 DIAGNOSIS — Z9861 Coronary angioplasty status: Secondary | ICD-10-CM | POA: Diagnosis not present

## 2023-07-16 MED ORDER — POLYETHYLENE GLYCOL 3350 17 GM/SCOOP PO POWD
17.0000 g | Freq: Every day | ORAL | 1 refills | Status: DC
  Filled 2023-07-16: qty 510, 30d supply, fill #0

## 2023-07-16 MED ORDER — ROSUVASTATIN CALCIUM 40 MG PO TABS
40.0000 mg | ORAL_TABLET | Freq: Every day | ORAL | 1 refills | Status: DC
  Filled 2023-07-16 – 2023-08-05 (×2): qty 90, 90d supply, fill #0
  Filled 2023-11-04: qty 90, 90d supply, fill #1

## 2023-07-16 MED ORDER — FERROUS GLUCONATE 324 (38 FE) MG PO TABS
324.0000 mg | ORAL_TABLET | Freq: Two times a day (BID) | ORAL | 3 refills | Status: DC
  Filled 2023-07-16: qty 100, 50d supply, fill #0

## 2023-07-16 NOTE — Patient Instructions (Signed)
 VISIT SUMMARY:  You came in today with left lower quadrant pain that feels like trapped gas and period cramps. You also mentioned having difficulty swallowing, a history of an anal fissure, and feeling overwhelmed with your medical issues.  YOUR PLAN:  -LEFT LOWER QUADRANT PAIN: Your pain is suspected to be due to trapped gas. We will do a CT scan of your abdomen to rule out other possible causes. A vascular blockage, which would cause more severe pain, is unlikely.  -DIFFICULTY SWALLOWING: You have intermittent difficulty swallowing, which is not life-threatening. A barium swallow test has been ordered to check for issues with your esophagus, and you may need to see a gastrointestinal specialist for further treatment.  -ANEMIA: Your hemoglobin level is low, which is causing fatigue and shortness of breath. This may be due to bleeding from your anal fissure. We have prescribed iron tablets and will repeat your blood test to check your hemoglobin level. You will also be referred to a general surgeon to evaluate and manage your anal fissure.  INSTRUCTIONS:  Please schedule a CT scan of your abdomen and a follow-up appointment for a repeat CBC to check your hemoglobin level. Additionally, follow up with a general surgeon for your anal fissure evaluation and management.

## 2023-07-16 NOTE — Progress Notes (Signed)
 Subjective:  Patient ID: Robin Walsh, female    DOB: 1965-10-18  Age: 58 y.o. MRN: [id number]  CC: Medical Management of Chronic Issues (Discuss recent urgent care visit)     Discussed the use of AI scribe software for clinical note transcription with the patient, who gave verbal consent to proceed.  History of Present Illness The patient with a history of tobacco abuse ( 1 ppd since the age of 26 quit in 10/2022), coronary artery disease (status post PCI and drug-eluting stent placement to LAD in 11/2015) peripheral arterial disease (status post stenting of bilateral common iliac artery), abdominal aortogram with bilateral peripheral vascular balloon angioplasty of bilateral common iliac with placement of 2 kissing covered stents in 10/2022. presents with left lower quadrant pain, described as a sensation of trapped gas and likened to period cramps.  The pain, which began a week ago, is located around the hip area and radiates to both sides of the hips, lower back, and down into the legs. The pain spikes intermittently, reaching an intensity of 8/10, before subsiding. The patient has been self-managing with ibuprofen and Tylenol, despite being aware that she should not take ibuprofen. She also reports some relief from Linzess and gas pills. The patient denies burping or passing gas frequently, and reports a sensation of gas being trapped. She presented to Urgent Care where an ED evaluation was recommended which she declined. Labs revealed a hgb of 7.9 down from 10.5 seven months ago and down from 12.3 nine months ago. UA was negative , CMET showed minimally elevated creatinine.   She also reports a history of an anal fissure, which occasionally bleeds.  Her last bleed was this morning.  At her last visit I had referred her for a barium swallow to evaluate difficulty swallowing, which comes and goes she is yet to complete this study.  She states symptoms have improved somewhat.  The patient is  currently unemployed and feeling overwhelmed with her medical issues.   For her peripheral arterial disease she continues to follow-up with cardiology and remains on her medications.  Denies planes of chest pain but does have some dyspnea.  She continues to smoke.  Past Medical History:  Diagnosis Date   Anal fissure    Anemia    CAD (coronary artery disease)    Hyperlipidemia    Hypertension     Past Surgical History:  Procedure Laterality Date   ABDOMINAL AORTOGRAM W/LOWER EXTREMITY N/A 01/14/2019   Procedure: ABDOMINAL AORTOGRAM W/LOWER EXTREMITY;  Surgeon: Iran Ouch, MD;  Location: [facility];  Service: Cardiovascular;  Laterality: N/A;  limited   ABDOMINAL AORTOGRAM W/LOWER EXTREMITY N/A 10/24/2022   Procedure: ABDOMINAL AORTOGRAM W/LOWER EXTREMITY;  Surgeon: Iran Ouch, MD;  Location: [facility];  Service: Cardiovascular;  Laterality: N/A;   CARDIAC CATHETERIZATION N/A 11/28/2015   Procedure: Left Heart Cath and Coronary Angiography;  Surgeon: Corky Crafts, MD;  Location: [facility] INVASIVE CV LAB;  Service: Cardiovascular;  Laterality: N/A;   CARDIAC CATHETERIZATION N/A 11/28/2015   Procedure: Coronary Stent Intervention;  Surgeon: Corky Crafts, MD;  Location: [facility] INVASIVE CV LAB;  Service: Cardiovascular;  Laterality: N/A;  DES to Mid LAD; 3.0x12 Synergy   LEG SURGERY Left 2002?   tibial plateau fracture   PERIPHERAL VASCULAR BALLOON ANGIOPLASTY Bilateral 01/14/2019   Procedure: PERIPHERAL VASCULAR BALLOON ANGIOPLASTY;  Surgeon: Iran Ouch, MD;  Location: [facility];  Service: Cardiovascular;  Laterality: Bilateral;  common iliac  PERIPHERAL VASCULAR BALLOON ANGIOPLASTY Bilateral 10/24/2022   Procedure: PERIPHERAL VASCULAR BALLOON ANGIOPLASTY;  Surgeon: Iran Ouch, MD;  Location: [facility];  Service: Cardiovascular;  Laterality: Bilateral;  L and R common iliac   PERIPHERAL VASCULAR CATHETERIZATION N/A 01/04/2016    Procedure: Abdominal Aortogram;  Surgeon: Yvonne Kendall, MD;  Location: [facility];  Service: Cardiovascular;  Laterality: N/A;   PERIPHERAL VASCULAR CATHETERIZATION Bilateral 01/04/2016   Procedure: Lower Extremity Angiography;  Surgeon: Yvonne Kendall, MD;  Location: [facility] IN[facility] CV LAB;  Service: Cardiovascular;  Laterality: Bilateral;   PERIPHERAL VASCULAR CATHETERIZATION Bilateral 01/04/2016   Procedure: Peripheral Vascular Intervention;  Surgeon: Yvonne Kendall, MD;  Location: [facility];  Service: Cardiovascular;  Laterality: Bilateral;  common iliac   PERIPHERAL VASCULAR INTERVENTION Bilateral 10/24/2022   Procedure: PERIPHERAL VASCULAR INTERVENTION;  Surgeon: Iran Ouch, MD;  Location: [facility];  Service: Cardiovascular;  Laterality: Bilateral;  R and L common iliac    Family History  Problem Relation Age of Onset   Congestive Heart Failure Mother 72   Alcohol abuse Father 46   Heart disease Brother    Liver disease Sister        kidney/liver    Social History   Socioeconomic History   Marital status: Single    Spouse name: Not on file   Number of children: 0   Years of education: Not on file   Highest education level: GED or equivalent  Occupational History   Occupation: clerical work  Tobacco Use   Smoking status: Every Day    Current packs/day: 0.50    Average packs/day: 0.5 packs/day for 25.0 years (12.5 ttl pk-yrs)    Types: Cigarettes   Smokeless tobacco: Never  Vaping Use   Vaping status: Never Used  Substance and Sexual Activity   Alcohol use: No   Drug use: Yes    Types: Marijuana    Comment: one week ago   Sexual activity: Not on file  Other Topics Concern   Not on file  Social History Narrative   Not on file   Social Drivers of Health   Financial Resource Strain: Patient Declined (07/16/2023)   Overall Financial Resource Strain (CARDIA)    Difficulty of Paying Living Expenses: Patient declined  Food Insecurity:  Patient Declined (07/16/2023)   Hunger Vital Sign    Worried About Running Out of Food in the Last Year: Patient declined    Ran Out of Food in the Last Year: Patient declined  Transportation Needs: No Transportation Needs (07/16/2023)   PRAPARE - Administrator, Civil Service (Medical): No    Lack of Transportation (Non-Medical): No  Physical Activity: Unknown (07/16/2023)   Exercise Vital Sign    Days of Exercise per Week: Patient declined    Minutes of Exercise per Session: 30 min  Stress: Stress Concern Present (07/16/2023)   [employer] of Occupational Health - Occupational Stress Questionnaire    Feeling of Stress : Rather much  Social Connections: Unknown (07/16/2023)   Social Connection and Isolation Panel [NHANES]    Frequency of Communication with Friends and Family: More than three times a week    Frequency of Social Gatherings with Friends and Family: Once a week    Attends Religious Services: Patient declined    Database administrator or Organizations: No    Attends Banker Meetings: Never    Marital Status: Living with partner    Allergies  Allergen  Reactions   Chantix [Varenicline Tartrate] Nausea Only   Wellbutrin [Bupropion] Itching   Codeine Nausea And Vomiting   Dilaudid [Hydromorphone Hcl] Nausea And Vomiting    Outpatient Medications Prior to Visit  Medication Sig Dispense Refill   albuterol (VENTOLIN HFA) 108 (90 Base) MCG/ACT inhaler Inhale 1-2 puffs into the lungs every 6 (six) hours as needed for wheezing or shortness of breath. 8.5 g 2   aspirin 81 MG chewable tablet Chew 1 tablet (81 mg total) by mouth daily. (Patient taking differently: Chew 81 mg by mouth every evening.) 30 tablet 11   clopidogrel (PLAVIX) 75 MG tablet Take 1 tablet (75 mg total) by mouth daily. 90 tablet 3   gabapentin (NEURONTIN) 300 MG capsule Take 2 capsules (600 mg total) by mouth every morning AND 3 capsules (900 mg total) every evening. 150 capsule 2    lisinopril (ZESTRIL) 10 MG tablet Take 1 tablet (10 mg total) by mouth daily. 30 tablet 6   nitroGLYCERIN (NITROSTAT) 0.4 MG SL tablet Place 1 tablet (0.4 mg total) under the tongue every 5 (five) minutes as needed for chest pain. X 3 doses 25 tablet 1   oxybutynin (DITROPAN-XL) 10 MG 24 hr tablet Take 1 tablet (10 mg total) by mouth at bedtime. 30 tablet 5   rosuvastatin (CRESTOR) 40 MG tablet Take 1 tablet (40 mg total) by mouth daily. 90 tablet 1   No facility-administered medications prior to visit.     ROS Review of Systems  Constitutional:  Negative for activity change and appetite change.  HENT:  Negative for sinus pressure and sore throat.   Respiratory:  Positive for shortness of breath. Negative for chest tightness and wheezing.   Cardiovascular:  Negative for chest pain and palpitations.  Gastrointestinal:  Positive for abdominal pain. Negative for abdominal distention and constipation.  Genitourinary: Negative.   Musculoskeletal: Negative.   Psychiatric/Behavioral:  Negative for behavioral problems and dysphoric mood.     Objective:  BP 119/67   Pulse 98   Ht 5\' 4"  (1.626 m)   Wt 159 lb 6.4 oz (72.3 kg)   LMP 03/26/2017   SpO2 99%   BMI 27.36 kg/m      07/16/2023    2:01 PM 07/12/2023    7:02 PM 01/23/2023    9:53 AM  BP/Weight  Systolic BP 119 115 123  Diastolic BP 67 69 69  Wt. (Lbs) 159.4  159.4  BMI 27.36 kg/m2  27.36 kg/m2      Physical Exam Constitutional:      Appearance: She is well-developed.  Cardiovascular:     Rate and Rhythm: Normal rate.     Heart sounds: Normal heart sounds. No murmur heard. Pulmonary:     Effort: Pulmonary effort is normal.     Breath sounds: Normal breath sounds. No wheezing or rales.  Chest:     Chest wall: No tenderness.  Abdominal:     General: Bowel sounds are normal. There is no distension.     Palpations: Abdomen is soft. There is no mass.     Tenderness: There is abdominal tenderness (slight LLQ TTP).   Musculoskeletal:        General: Normal range of motion.     Right lower leg: No edema.     Left lower leg: No edema.  Neurological:     Mental Status: She is alert and oriented to person, place, and time.  Psychiatric:        Mood and Affect: Mood normal.  Latest Ref Rng & Units 07/12/2023    8:13 PM 10/09/2022   11:17 AM 05/30/2022   10:11 AM  CMP  Glucose 70 - 99 mg/dL 93  366  440   BUN 6 - 24 mg/dL 12  11  10    Creatinine 0.57 - 1.00 mg/dL 3.47  4.25  9.56   Sodium 134 - 144 mmol/L 137  138  140   Potassium 3.5 - 5.2 mmol/L 4.0  4.9  5.2   Chloride 96 - 106 mmol/L 103  103  103   CO2 20 - 29 mmol/L 18  22  23    Calcium 8.7 - 10.2 mg/dL 9.3  9.6  9.6   Total Protein 6.0 - 8.5 g/dL 7.4  7.0  7.2   Total Bilirubin 0.0 - 1.2 mg/dL <3.8  0.3  0.3   Alkaline Phos 44 - 121 IU/L 101  94  86   AST 0 - 40 IU/L 10  12  12    ALT 0 - 32 IU/L 5  8  10      Lipid Panel     Component Value Date/Time   CHOL 182 10/09/2022 1117   TRIG 242 (H) 10/09/2022 1117   HDL 28 (L) 10/09/2022 1117   CHOLHDL 6.5 (H) 10/09/2022 1117   CHOLHDL 7.8 11/27/2015 0453   VLDL 66 (H) 11/27/2015 0453   LDLCALC 112 (H) 10/09/2022 1117    CBC    Component Value Date/Time   WBC 12.2 (H) 07/12/2023 2013   WBC 9.4 10/24/2018 0533   RBC 3.81 07/12/2023 2013   RBC 4.07 10/24/2018 0533   HGB 7.9 (L) 07/12/2023 2013   HCT 29.8 (L) 07/12/2023 2013   PLT 543 (H) 07/12/2023 2013   MCV 78 (L) 07/12/2023 2013   MCH 20.7 (L) 07/12/2023 2013   MCH 21.4 (L) 10/24/2018 0533   MCHC 26.5 (L) 07/12/2023 2013   MCHC 29.3 (L) 10/24/2018 0533   RDW 16.5 (H) 07/12/2023 2013   LYMPHSABS 2.6 04/26/2021 1418   MONOABS 0.7 10/23/2018 1406   EOSABS 0.5 (H) 04/26/2021 1418   BASOSABS 0.0 04/26/2021 1418    Lab Results  Component Value Date   HGBA1C 5.9 (H) 11/19/2022       Assessment & Plan Left Lower Quadrant Pain  Imaging planned to exclude other etiologies especially in the setting of worsening anemia.   Differentials include diverticulitis, colitis, malignancy She does not have symptoms of mesenteric angina  - Order CT scan of the abdomen.  Dysphagia Intermittent dysphagia, non-life-threatening. Barium swallow previously ordered to evaluate esophageal motility or stenosis, which may necessitate GI referral for dilation. - She is yet to complete this and has been advised to do so  Anemia Anemia with hemoglobin of 7.9 g/dL, decreased from 75.6 g/d previously and it was normal at 12.3 , nine months ago.   Symptoms include fatigue and dyspnea, likely due to anemia. Suspected bleeding from anal fissure. Discussed potential need for emergency intervention if hemoglobin decreases further. -Placed on ferrous sulfate. - Order repeat CBC to check hemoglobin level and if there is a further decrease that we will refer her for transfusion. - Refer to general surgeon for evaluation and management of anal fissure.   PAD -Asymptomatic -Status post bilateral iliac artery stent -Risk factor modification including smoking cessation -Continue statin, Plavix   CAD Status post PCI - Risk factor modification - Continue statin - Follow-up with cardiology  Meds ordered this encounter  Medications   rosuvastatin (CRESTOR) 40  MG tablet    Sig: Take 1 tablet (40 mg total) by mouth daily.    Dispense:  90 tablet    Refill:  1   ferrous gluconate (FERGON) 324 MG tablet    Sig: Take 1 tablet (324 mg total) by mouth 2 (two) times daily with a meal.    Dispense:  60 tablet    Refill:  3   polyethylene glycol powder (GLYCOLAX/MIRALAX) 17 GM/SCOOP powder    Sig: Mix 1 capful (17 g) into at least 4-8 ounces of water/juice and take by mouth daily.    Dispense:  510 g    Refill:  1    Follow-up: Return in about 3 months (around 10/15/2023) for Chronic medical conditions.       Hoy Register, MD, FAAFP. [facility] [place], [place] [phone]   07/16/2023, 3:36  PM

## 2023-07-17 ENCOUNTER — Other Ambulatory Visit: Payer: Self-pay

## 2023-07-17 ENCOUNTER — Encounter: Payer: Self-pay | Admitting: Family Medicine

## 2023-07-17 LAB — CBC WITH DIFFERENTIAL/PLATELET
Basophils Absolute: 0 10*3/uL (ref 0.0–0.2)
Basos: 0 %
EOS (ABSOLUTE): 0.3 10*3/uL (ref 0.0–0.4)
Eos: 3 %
Hematocrit: 28 % — ABNORMAL LOW (ref 34.0–46.6)
Hemoglobin: 7.8 g/dL — ABNORMAL LOW (ref 11.1–15.9)
Immature Grans (Abs): 0.1 10*3/uL (ref 0.0–0.1)
Immature Granulocytes: 1 %
Lymphocytes Absolute: 1.7 10*3/uL (ref 0.7–3.1)
Lymphs: 16 %
MCH: 20.5 pg — ABNORMAL LOW (ref 26.6–33.0)
MCHC: 27.9 g/dL — ABNORMAL LOW (ref 31.5–35.7)
MCV: 74 fL — ABNORMAL LOW (ref 79–97)
Monocytes Absolute: 0.6 10*3/uL (ref 0.1–0.9)
Monocytes: 6 %
Neutrophils Absolute: 7.8 10*3/uL — ABNORMAL HIGH (ref 1.4–7.0)
Neutrophils: 74 %
Platelets: 540 10*3/uL — ABNORMAL HIGH (ref 150–450)
RBC: 3.8 x10E6/uL (ref 3.77–5.28)
RDW: 16.7 % — ABNORMAL HIGH (ref 11.7–15.4)
WBC: 10.5 10*3/uL (ref 3.4–10.8)

## 2023-07-18 ENCOUNTER — Other Ambulatory Visit: Payer: Self-pay

## 2023-07-18 ENCOUNTER — Emergency Department (HOSPITAL_COMMUNITY)
Admission: EM | Admit: 2023-07-18 | Discharge: 2023-07-18 | Disposition: A | Discharge status: Home / Self Care | Attending: Emergency Medicine | Admitting: Emergency Medicine

## 2023-07-18 DIAGNOSIS — R0602 Shortness of breath: Secondary | ICD-10-CM | POA: Diagnosis not present

## 2023-07-18 DIAGNOSIS — Z79899 Other long term (current) drug therapy: Secondary | ICD-10-CM | POA: Insufficient documentation

## 2023-07-18 DIAGNOSIS — R5383 Other fatigue: Secondary | ICD-10-CM | POA: Diagnosis present

## 2023-07-18 DIAGNOSIS — Z7902 Long term (current) use of antithrombotics/antiplatelets: Secondary | ICD-10-CM | POA: Insufficient documentation

## 2023-07-18 DIAGNOSIS — D649 Anemia, unspecified: Secondary | ICD-10-CM

## 2023-07-18 DIAGNOSIS — Z7982 Long term (current) use of aspirin: Secondary | ICD-10-CM | POA: Insufficient documentation

## 2023-07-18 DIAGNOSIS — I10 Essential (primary) hypertension: Secondary | ICD-10-CM | POA: Insufficient documentation

## 2023-07-18 DIAGNOSIS — I2510 Atherosclerotic heart disease of native coronary artery without angina pectoris: Secondary | ICD-10-CM | POA: Insufficient documentation

## 2023-07-18 DIAGNOSIS — D509 Iron deficiency anemia, unspecified: Secondary | ICD-10-CM | POA: Insufficient documentation

## 2023-07-18 LAB — RETICULOCYTES
Immature Retic Fract: 30.9 % — ABNORMAL HIGH (ref 2.3–15.9)
RBC.: 3.71 MIL/uL — ABNORMAL LOW (ref 3.87–5.11)
Retic Count, Absolute: 63.1 10*3/uL (ref 19.0–186.0)
Retic Ct Pct: 1.7 % (ref 0.4–3.1)

## 2023-07-18 LAB — COMPREHENSIVE METABOLIC PANEL WITH GFR
ALT: 7 U/L (ref 0–44)
AST: 12 U/L — ABNORMAL LOW (ref 15–41)
Albumin: 3.3 g/dL — ABNORMAL LOW (ref 3.5–5.0)
Alkaline Phosphatase: 71 U/L (ref 38–126)
Anion gap: 10 (ref 5–15)
BUN: 14 mg/dL (ref 6–20)
CO2: 22 mmol/L (ref 22–32)
Calcium: 8.7 mg/dL — ABNORMAL LOW (ref 8.9–10.3)
Chloride: 108 mmol/L (ref 98–111)
Creatinine, Ser: 0.83 mg/dL (ref 0.44–1.00)
GFR, Estimated: >60 mL/min (ref >60)
Glucose, Bld: 124 mg/dL — ABNORMAL HIGH (ref 70–99)
Potassium: 3.5 mmol/L (ref 3.5–5.1)
Sodium: 140 mmol/L (ref 135–145)
Total Bilirubin: 0.4 mg/dL (ref 0.0–1.2)
Total Protein: 7.4 g/dL (ref 6.5–8.1)

## 2023-07-18 LAB — CBC
HCT: 27.9 % — ABNORMAL LOW (ref 36.0–46.0)
Hemoglobin: 7.6 g/dL — ABNORMAL LOW (ref 12.0–15.0)
MCH: 20.7 pg — ABNORMAL LOW (ref 26.0–34.0)
MCHC: 27.2 g/dL — ABNORMAL LOW (ref 30.0–36.0)
MCV: 75.8 fL — ABNORMAL LOW (ref 80.0–100.0)
Platelets: 503 10*3/uL — ABNORMAL HIGH (ref 150–400)
RBC: 3.68 MIL/uL — ABNORMAL LOW (ref 3.87–5.11)
RDW: 17.9 % — ABNORMAL HIGH (ref 11.5–15.5)
WBC: 8.5 10*3/uL (ref 4.0–10.5)
nRBC: 0 % (ref 0.0–0.2)

## 2023-07-18 LAB — IRON AND TIBC
Iron: 20 ug/dL — ABNORMAL LOW (ref 28–170)
Saturation Ratios: 5 % — ABNORMAL LOW (ref 10.4–31.8)
TIBC: 423 ug/dL (ref 250–450)
UIBC: 403 ug/dL

## 2023-07-18 LAB — PREPARE RBC (CROSSMATCH)

## 2023-07-18 LAB — FOLATE: Folate: 6.2 ng/mL (ref >5.9)

## 2023-07-18 LAB — FERRITIN: Ferritin: 2 ng/mL — ABNORMAL LOW (ref 11–307)

## 2023-07-18 LAB — VITAMIN B12: Vitamin B-12: 158 pg/mL — ABNORMAL LOW (ref 180–914)

## 2023-07-18 MED ORDER — SODIUM CHLORIDE 0.9% IV SOLUTION: Freq: Once | INTRAVENOUS | Status: AC

## 2023-07-18 MED ORDER — VITAMIN B-12 100 MCG PO TABS
100.0000 ug | ORAL_TABLET | Freq: Every day | ORAL | 0 refills | Status: AC
  Filled 2023-07-18: qty 30, 30d supply, fill #0

## 2023-07-18 MED ORDER — FERROUS SULFATE 325 (65 FE) MG PO TABS
325.0000 mg | ORAL_TABLET | Freq: Every day | ORAL | 0 refills | Status: DC
  Filled 2023-07-18: qty 30, 30d supply, fill #0

## 2023-07-18 NOTE — ED Notes (Signed)
 Pt given a ham sandwich and a coke as requested

## 2023-07-18 NOTE — ED Triage Notes (Signed)
 Pt arrived via POV. C/o fatigue d/t low hgb. Stated hgb was 7.9 several days. Pt has known rectal fissure that will be treated next week.

## 2023-07-18 NOTE — ED Notes (Signed)
 Blood bank has blood ready, notified Dara, EMT-P.

## 2023-07-18 NOTE — ED Provider Notes (Signed)
 [place] EMERGENCY DEPARTMENT AT [facility] Provider Note   CSN: [id number] Arrival date & time: 07/18/23  7829     History  Chief Complaint  Patient presents with   low hgb   Fatigue    Robin Walsh is a 58 y.o. female.  Patient is a 58 year old female with a past medical history of CAD, hypertension and anal fissures presenting to the emergency department with concern of symptomatic anemia.  The patient states over the last few weeks she started to have increasing fatigue.  She states that she has had some mild shortness of breath and some lightheadedness upon standing.  She states that she usually has blood when she wipes after straining for a bowel movement but has not had any black or bloody stools.  She denies any nausea, vomiting or hematemesis.  The patient states that she was seen by her primary doctor and was recommended to come to the ER if she was having worsening symptoms.  She was having some abdominal pain previously but she states that that has since resolved.  The history is provided by the patient.       Home Medications Prior to Admission medications   Medication Sig Start Date End Date Taking? Authorizing Provider  ferrous sulfate 325 (65 FE) MG tablet Take 1 tablet (325 mg total) by mouth daily. 07/18/23  Yes Elayne Snare K, DO  vitamin B-12 (CYANOCOBALAMIN) 100 MCG tablet Take 1 tablet (100 mcg total) by mouth daily. 07/18/23  Yes Theresia Lo, Benetta Spar K, DO  albuterol (VENTOLIN HFA) 108 (90 Base) MCG/ACT inhaler Inhale 1-2 puffs into the lungs every 6 (six) hours as needed for wheezing or shortness of breath. 07/11/22   Hoy Register, MD  aspirin 81 MG chewable tablet Chew 1 tablet (81 mg total) by mouth daily. Patient taking differently: Chew 81 mg by mouth every evening. 02/26/17   Hoy Register, MD  clopidogrel (PLAVIX) 75 MG tablet Take 1 tablet (75 mg total) by mouth daily. 10/24/22 10/24/23  Iran Ouch, MD  ferrous gluconate  (FERGON) 324 MG tablet Take 1 tablet (324 mg total) by mouth 2 (two) times daily with a meal. 07/16/23   Hoy Register, MD  gabapentin (NEURONTIN) 300 MG capsule Take 2 capsules (600 mg total) by mouth every morning AND 3 capsules (900 mg total) every evening. 07/10/23   Hoy Register, MD  lisinopril (ZESTRIL) 10 MG tablet Take 1 tablet (10 mg total) by mouth daily. 10/24/22   Iran Ouch, MD  nitroGLYCERIN (NITROSTAT) 0.4 MG SL tablet Place 1 tablet (0.4 mg total) under the tongue every 5 (five) minutes as needed for chest pain. X 3 doses 11/27/21   Hoy Register, MD  oxybutynin (DITROPAN-XL) 10 MG 24 hr tablet Take 1 tablet (10 mg total) by mouth at bedtime. 12/12/22   Anders Simmonds, PA-C  polyethylene glycol powder (GLYCOLAX/MIRALAX) 17 GM/SCOOP powder Mix 1 capful (17 g) into at least 4-8 ounces of water/juice and take by mouth daily. 07/16/23   Hoy Register, MD  rosuvastatin (CRESTOR) 40 MG tablet Take 1 tablet (40 mg total) by mouth daily. 07/16/23   Hoy Register, MD      Allergies    Chantix [varenicline tartrate], Wellbutrin [bupropion], Codeine, and Dilaudid [hydromorphone hcl]    Review of Systems   Review of Systems  Physical Exam Updated Vital Signs BP 131/63   Pulse 87   Temp 98.8 F (37.1 C) (Oral)   Resp 18   LMP 03/26/2017  SpO2 98%  Physical Exam Vitals and nursing note reviewed.  Constitutional:      General: She is not in acute distress.    Appearance: Normal appearance.  HENT:     Head: Normocephalic and atraumatic.     Nose: Nose normal.     Mouth/Throat:     Mouth: Mucous membranes are moist.     Pharynx: Oropharynx is clear.  Eyes:     Extraocular Movements: Extraocular movements intact.     Comments: Pale conjunctiva  Cardiovascular:     Rate and Rhythm: Normal rate and regular rhythm.     Heart sounds: Normal heart sounds.  Pulmonary:     Effort: Pulmonary effort is normal.     Breath sounds: Normal breath sounds.  Abdominal:      General: Abdomen is flat.     Palpations: Abdomen is soft.     Tenderness: There is no abdominal tenderness.  Musculoskeletal:        General: Normal range of motion.     Cervical back: Normal range of motion.  Skin:    General: Skin is warm and dry.  Neurological:     General: No focal deficit present.     Mental Status: She is alert and oriented to person, place, and time.  Psychiatric:        Mood and Affect: Mood normal.        Behavior: Behavior normal.     ED Results / Procedures / Treatments   Labs (all labs ordered are listed, but only abnormal results are displayed) Labs Reviewed  CBC - Abnormal; Notable for the following components:      Result Value   RBC 3.68 (*)    Hemoglobin 7.6 (*)    HCT 27.9 (*)    MCV 75.8 (*)    MCH 20.7 (*)    MCHC 27.2 (*)    RDW 17.9 (*)    Platelets 503 (*)    All other components within normal limits  COMPREHENSIVE METABOLIC PANEL WITH GFR - Abnormal; Notable for the following components:   Glucose, Bld 124 (*)    Calcium 8.7 (*)    Albumin 3.3 (*)    AST 12 (*)    All other components within normal limits  VITAMIN B12 - Abnormal; Notable for the following components:   Vitamin B-12 158 (*)    All other components within normal limits  IRON AND TIBC - Abnormal; Notable for the following components:   Iron 20 (*)    Saturation Ratios 5 (*)    All other components within normal limits  FERRITIN - Abnormal; Notable for the following components:   Ferritin 2 (*)    All other components within normal limits  RETICULOCYTES - Abnormal; Notable for the following components:   RBC. 3.71 (*)    Immature Retic Fract 30.9 (*)    All other components within normal limits  FOLATE  TYPE AND SCREEN  PREPARE RBC (CROSSMATCH)    EKG EKG Interpretation Date/Time:  Thursday July 18 2023 09:48:36 EDT Ventricular Rate:  93 PR Interval:  168 QRS Duration:  88 QT Interval:  357 QTC Calculation: 444 R Axis:   56  Text  Interpretation: Sinus rhythm No significant change since last tracing Confirmed by Elayne Snare ([id number]) on 07/18/2023 10:20:45 AM  Radiology No results found.  Procedures .Critical Care  Performed by: Rexford Maus, DO Authorized by: Rexford Maus, DO   Critical care provider statement:  Critical care time (minutes):  30   Critical care was time spent personally by me on the following activities:  Development of treatment plan with patient or surrogate, discussions with consultants, evaluation of patient's response to treatment, examination of patient, ordering and review of laboratory studies, ordering and review of radiographic studies, ordering and performing treatments and interventions, pulse oximetry, re-evaluation of patient's condition and review of old charts     Medications Ordered in ED Medications  0.9 %  sodium chloride infusion (Manually program via Guardrails IV Fluids) (0 mLs Intravenous Stopped 07/18/23 1512)    ED Course/ Medical Decision Making/ A&P Clinical Course as of 07/18/23 1542  Thu Jul 18, 2023  1010 Worsening anemia, Hgb 7.6. Will be recommended transfusion for symptomatic anemia. [VK]  1139 Low ferritin, iron and B12. Will be started on iron supplementation. [VK]  1425 Patient tolerating transfusion well, will be stable for discharge once transfusion is complete. [VK]    Clinical Course User Index [VK] Rexford Maus, DO                                 Medical Decision Making This patient presents to the ED with chief complaint(s) of fatigue with pertinent past medical history of CAD, hypertension, anal fissures which further complicates the presenting complaint. The complaint involves an extensive differential diagnosis and also carries with it a high risk of complications and morbidity.    The differential diagnosis includes arrhythmia, anemia, coagulopathy, GI bleed, dehydration, electrolyte abnormality  Additional history  obtained: Additional history obtained from N/A Records reviewed Primary Care Documents and urgent care records, outpatient colonoscopy 2020  ED Course and Reassessment: On patient's arrival she is hemodynamically stable in no acute distress.  Will have labs including anemia labs performed as she was found to be acutely anemic by her PCP few days ago that had worsened over the last 6 months.  Patient was recommended rectal exam to evaluate for source of anemia however she refused.    Independent labs interpretation:  The following labs were independently interpreted: acute on chronic anemia, low B12 and iron deficiency  Independent visualization of imaging: - N/A  Consultation: - Consulted or discussed management/test interpretation w/ external professional: N/A  Consideration for admission or further workup: Patient has no emergent conditions requiring admission or further work-up at this time and is stable for discharge home with primary care follow-up  Social Determinants of health: N/A    Amount and/or Complexity of Data Reviewed Labs: ordered.  Risk OTC drugs. Prescription drug management.          Final Clinical Impression(s) / ED Diagnoses Final diagnoses:  Symptomatic anemia    Rx / DC Orders ED Discharge Orders          Ordered    ferrous sulfate 325 (65 FE) MG tablet  Daily        07/18/23 1139    vitamin B-12 (CYANOCOBALAMIN) 100 MCG tablet  Daily        07/18/23 1427              Rexford Maus, DO 07/18/23 1542

## 2023-07-18 NOTE — Discharge Instructions (Addendum)
 You were seen in the emergency department for your fatigue.  You did have worsening anemia and your hemoglobin today was 7.6.  We did give you an unit of blood.  You are iron deficient and had mildly low vitamin B12.  I have given you an iron and B12 supplement that you should make sure that you are taking daily and you should follow-up with your primary doctor to have your hemoglobin rechecked as well as with GI for further workup of your anemia.  You should return to the emergency department if you are having worsening fatigue, heavy bleeding from your rectum, black stools or any other new or concerning symptoms.

## 2023-07-19 ENCOUNTER — Encounter: Payer: Self-pay | Admitting: Family Medicine

## 2023-07-19 LAB — BPAM RBC
Blood Product Expiration Date: 202505042359
ISSUE DATE / TIME: 202504101159
Unit Type and Rh: 600

## 2023-07-19 LAB — TYPE AND SCREEN
ABO/RH(D): A POS
Antibody Screen: NEGATIVE
Unit division: 0

## 2023-07-23 ENCOUNTER — Ambulatory Visit
Admission: RE | Admit: 2023-07-23 | Discharge: 2023-07-23 | Disposition: A | Discharge status: Home / Self Care | Source: Ambulatory Visit | Attending: Family Medicine | Admitting: Family Medicine

## 2023-07-23 ENCOUNTER — Encounter: Payer: Self-pay | Admitting: Radiology

## 2023-07-23 DIAGNOSIS — R1032 Left lower quadrant pain: Secondary | ICD-10-CM

## 2023-07-23 MED ORDER — IOPAMIDOL (ISOVUE-370) INJECTION 76%
100.0000 mL | Freq: Once | INTRAVENOUS | Status: AC | PRN
  Administered 2023-07-23: 100 mL via INTRAVENOUS

## 2023-07-24 ENCOUNTER — Telehealth: Payer: Self-pay | Admitting: Cardiology

## 2023-07-24 ENCOUNTER — Ambulatory Visit (HOSPITAL_COMMUNITY)
Admission: RE | Admit: 2023-07-24 | Discharge: 2023-07-24 | Disposition: A | Discharge status: Home / Self Care | Source: Ambulatory Visit | Attending: Cardiovascular Disease | Admitting: Cardiovascular Disease

## 2023-07-24 ENCOUNTER — Ambulatory Visit (HOSPITAL_BASED_OUTPATIENT_CLINIC_OR_DEPARTMENT_OTHER)
Admission: RE | Admit: 2023-07-24 | Discharge: 2023-07-24 | Disposition: A | Discharge status: Home / Self Care | Source: Ambulatory Visit | Attending: Cardiovascular Disease | Admitting: Cardiovascular Disease

## 2023-07-24 DIAGNOSIS — Z95828 Presence of other vascular implants and grafts: Secondary | ICD-10-CM | POA: Insufficient documentation

## 2023-07-24 DIAGNOSIS — I739 Peripheral vascular disease, unspecified: Secondary | ICD-10-CM | POA: Insufficient documentation

## 2023-07-24 LAB — VAS US ABI WITH/WO TBI
Left ABI: 0.76
Right ABI: 1.07

## 2023-07-24 NOTE — Telephone Encounter (Signed)
 Left message for patient to call back

## 2023-07-24 NOTE — Telephone Encounter (Signed)
 Patient stated she had a CT Contrast test yesterday (less than 24 hours ago) and drank barium.  Patient wants to know if this chemical will affect her doppler tests today.

## 2023-07-24 NOTE — Progress Notes (Signed)
 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 Alvenia Aus ideally, if no opening, schedule the patient to see me in the next few weeks in PV slot

## 2023-07-24 NOTE — Telephone Encounter (Signed)
 Patient returned RN's call and stated she will keep her test appointment today.

## 2023-07-24 NOTE — Telephone Encounter (Signed)
 Notified that pt has arrived for her appointment at [facility] for dopplers.

## 2023-07-24 NOTE — Progress Notes (Signed)
 See separate report, left lower extremity ABI worsened compare to the previous study. Need follow up

## 2023-07-26 ENCOUNTER — Other Ambulatory Visit: Payer: Self-pay

## 2023-07-29 ENCOUNTER — Other Ambulatory Visit: Payer: Self-pay

## 2023-07-31 ENCOUNTER — Other Ambulatory Visit: Payer: Self-pay | Admitting: Family Medicine

## 2023-07-31 ENCOUNTER — Encounter: Payer: Self-pay | Admitting: Family Medicine

## 2023-07-31 DIAGNOSIS — Z95828 Presence of other vascular implants and grafts: Secondary | ICD-10-CM

## 2023-08-01 ENCOUNTER — Ambulatory Visit: Attending: Family Medicine

## 2023-08-01 DIAGNOSIS — Z95828 Presence of other vascular implants and grafts: Secondary | ICD-10-CM

## 2023-08-02 ENCOUNTER — Encounter: Payer: Self-pay | Admitting: Family Medicine

## 2023-08-02 LAB — CBC WITH DIFFERENTIAL/PLATELET
Basophils Absolute: 0 x10E3/uL (ref 0.0–0.2)
Basos: 0 %
EOS (ABSOLUTE): 0.2 x10E3/uL (ref 0.0–0.4)
Eos: 3 %
Hematocrit: 35.5 % (ref 34.0–46.6)
Hemoglobin: 10.3 g/dL — ABNORMAL LOW (ref 11.1–15.9)
Immature Grans (Abs): 0 x10E3/uL (ref 0.0–0.1)
Immature Granulocytes: 0 %
Lymphocytes Absolute: 1.9 x10E3/uL (ref 0.7–3.1)
Lymphs: 24 %
MCH: 22.4 pg — ABNORMAL LOW (ref 26.6–33.0)
MCHC: 29 g/dL — ABNORMAL LOW (ref 31.5–35.7)
MCV: 77 fL — ABNORMAL LOW (ref 79–97)
Monocytes Absolute: 0.6 x10E3/uL (ref 0.1–0.9)
Monocytes: 7 %
Neutrophils Absolute: 5.3 x10E3/uL (ref 1.4–7.0)
Neutrophils: 66 %
Platelets: 437 x10E3/uL (ref 150–450)
RBC: 4.59 x10E6/uL (ref 3.77–5.28)
RDW: 19.8 % — ABNORMAL HIGH (ref 11.7–15.4)
WBC: 8.1 x10E3/uL (ref 3.4–10.8)

## 2023-08-06 ENCOUNTER — Ambulatory Visit: Attending: Cardiovascular Disease | Admitting: Cardiovascular Disease

## 2023-08-06 ENCOUNTER — Other Ambulatory Visit: Payer: Self-pay

## 2023-08-06 ENCOUNTER — Encounter: Payer: Self-pay | Admitting: Family Medicine

## 2023-08-06 ENCOUNTER — Encounter: Payer: Self-pay | Admitting: Cardiovascular Disease

## 2023-08-06 VITALS — BP 144/76 | HR 100 | Ht 64.0 in | Wt 156.6 lb

## 2023-08-06 DIAGNOSIS — I10 Essential (primary) hypertension: Secondary | ICD-10-CM | POA: Diagnosis not present

## 2023-08-06 DIAGNOSIS — I2510 Atherosclerotic heart disease of native coronary artery without angina pectoris: Secondary | ICD-10-CM | POA: Diagnosis not present

## 2023-08-06 DIAGNOSIS — I739 Peripheral vascular disease, unspecified: Secondary | ICD-10-CM | POA: Diagnosis not present

## 2023-08-06 DIAGNOSIS — Z79899 Other long term (current) drug therapy: Secondary | ICD-10-CM | POA: Insufficient documentation

## 2023-08-06 DIAGNOSIS — E785 Hyperlipidemia, unspecified: Secondary | ICD-10-CM | POA: Diagnosis not present

## 2023-08-06 MED ORDER — LISINOPRIL 10 MG PO TABS
10.0000 mg | ORAL_TABLET | Freq: Every day | ORAL | 3 refills | Status: DC
  Filled 2023-08-06: qty 90, 90d supply, fill #0
  Filled 2023-11-04: qty 90, 90d supply, fill #1

## 2023-08-06 NOTE — Progress Notes (Signed)
 Cardiology Office Note   Date:  08/06/2023   ID:  Robin Walsh, DOB 06-27-1965, MRN [id number]  PCP:  Joaquin Mulberry, MD  Cardiologist:  Dr. Renna Cary  No chief complaint on file.     History of Present Illness: Robin Walsh is a 58 y.o. female who presents for a follow-up visit regarding  peripheral arterial disease. She has known history of coronary artery disease, hypertension, hyperlipidemia and tobacco use. She is status post LAD PCI in 2017. She is status post bilateral common iliac artery kissing stent placement in 12/2015 for severe claudication. She had no significant infrainguinal disease. She had recurrent claudication in 2020 due to severe in-stent restenosis.  Preprocedure labs revealed severe anemia with a hemoglobin of 7.5 and thus the procedure was postponed.  She was hospitalized and transfused.  She was then seen by GI and had EGD and colonoscopy.  She had few polyps removed and she also reports having an anal fissure that was causing the bleeding.  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  I performed successful drug-coated balloon angioplasty to bilateral common iliac arteries.    She had recurrent severe claudication last year.  Angiography was performed in July which showed occluded right common iliac artery stent and severe stenosis in the left common iliac artery stent.  I performed successful endovascular intervention to bilateral common iliac arteries with placement of 2 kissing covered stents.  A self-expanding stent was added on the right side due to flow-limiting dissection.  She quit smoking last year.  She now returns with severe left leg claudication and occasional symptoms at rest as well.  She had recent Doppler studies which showed subtotal occlusion of the left common iliac artery stent with peak velocity of close to 700 in the distal segment.  She had recent issues with  blood loss anemia related to anal fissure and underwent transfusion.  She had CT scan of the abdomen which showed patent iliac stents with adjacent inflammation and small fluid collection.  She has no infection symptoms and no reported fevers.  Recent labs showed no evidence of leukocytosis.  I suspect that this is inflammatory in nature.    Past Medical History:  Diagnosis Date   Anal fissure    Anemia    CAD (coronary artery disease)    Hyperlipidemia    Hypertension     Past Surgical History:  Procedure Laterality Date   ABDOMINAL AORTOGRAM W/LOWER EXTREMITY N/A 01/14/2019   Procedure: ABDOMINAL AORTOGRAM W/LOWER EXTREMITY;  Surgeon: Wenona Hamilton, MD;  Location: [facility];  Service: Cardiovascular;  Laterality: N/A;  limited   ABDOMINAL AORTOGRAM W/LOWER EXTREMITY N/A 10/24/2022   Procedure: ABDOMINAL AORTOGRAM W/LOWER EXTREMITY;  Surgeon: Wenona Hamilton, MD;  Location: [facility];  Service: Cardiovascular;  Laterality: N/A;   CARDIAC CATHETERIZATION N/A 11/28/2015   Procedure: Left Heart Cath and Coronary Angiography;  Surgeon: Lucendia Rusk, MD;  Location: [facility] INVAS[facility] CV [facility];  Service: Cardiovascular;  Laterality: N/A;   CARDIAC CATHETERIZATION N/A 11/28/2015   Procedure: Coronary Stent Intervention;  Surgeon: Lucendia Rusk, MD;  Location: [facility] INVASIVE CV LAB;  Service: Cardiovascular;  Laterality: N/A;  DES to Mid LAD; 3.0x12 Synergy   LEG SURGERY Left 2002?   tibial plateau fracture   PERIPHERAL VASCULAR BALLOON ANGIOPLASTY Bilateral 01/14/2019   Procedure: PERIPHERAL VASCULAR BALLOON ANGIOPLASTY;  Surgeon: Wenona Hamilton, MD;  Location: [facility];  Service: Cardiovascular;  Laterality: Bilateral;  common iliac   PERIPHERAL VASCULAR BALLOON ANGIOPLASTY Bilateral 10/24/2022   Procedure: PERIPHERAL VASCULAR BALLOON ANGIOPLASTY;  Surgeon: Wenona Hamilton, MD;  Location: [facility];  Service: Cardiovascular;  Laterality: Bilateral;  L  and R common iliac   PERIPHERAL VASCULAR CATHETERIZATION N/A 01/04/2016   Procedure: Abdominal Aortogram;  Surgeon: Sammy Crisp, MD;  Location: [facility];  Service: Cardiovascular;  Laterality: N/A;   PERIPHERAL VASCULAR CATHETERIZATION Bilateral 01/04/2016   Procedure: Lower Extremity Angiography;  Surgeon: Sammy Crisp, MD;  Location: [facility] INVASIVE CV LAB;  Service: Cardiovascular;  Laterality: Bilateral;   PERIPHERAL VASCULAR CATHETERIZATION Bilateral 01/04/2016   Procedure: Peripheral Vascular Intervention;  Surgeon: Sammy Crisp, MD;  Location: [facility];  Service: Cardiovascular;  Laterality: Bilateral;  common iliac   PERIPHERAL VASCULAR INTERVENTION Bilateral 10/24/2022   Procedure: PERIPHERAL VASCULAR INTERVENTION;  Surgeon: Wenona Hamilton, MD;  Location: [facility];  Service: Cardiovascular;  Laterality: Bilateral;  R and L common iliac     Current Outpatient Medications  Medication Sig Dispense Refill   albuterol  (VENTOLIN  HFA) 108 (90 Base) MCG/ACT inhaler Inhale 1-2 puffs into the lungs every 6 (six) hours as needed for wheezing or shortness of breath. 8.5 g 2   aspirin  81 MG chewable tablet Chew 1 tablet (81 mg total) by mouth daily. (Patient taking differently: Chew 81 mg by mouth every evening.) 30 tablet 11   clopidogrel  (PLAVIX ) 75 MG tablet Take 1 tablet (75 mg total) by mouth daily. 90 tablet 3   ferrous gluconate  (FERGON) 324 MG tablet Take 1 tablet (324 mg total) by mouth 2 (two) times daily with a meal. 60 tablet 3   ferrous sulfate  325 (65 FE) MG tablet Take 1 tablet (325 mg total) by mouth daily. 30 tablet 0   gabapentin  (NEURONTIN ) 300 MG capsule Take 2 capsules (600 mg total) by mouth every morning AND 3 capsules (900 mg total) every evening. 150 capsule 2   nitroGLYCERIN  (NITROSTAT ) 0.4 MG SL tablet Place 1 tablet (0.4 mg total) under the tongue every 5 (five) minutes as needed for chest pain. X 3 doses 25 tablet 1   oxybutynin   (DITROPAN -XL) 10 MG 24 hr tablet Take 1 tablet (10 mg total) by mouth at bedtime. 30 tablet 5   polyethylene glycol powder (GLYCOLAX /MIRALAX ) 17 GM/SCOOP powder Mix 1 capful (17 g) into at least 4-8 ounces of water/juice and take by mouth daily. 510 g 1   rosuvastatin  (CRESTOR ) 40 MG tablet Take 1 tablet (40 mg total) by mouth daily. 90 tablet 1   vitamin B-12 (CYANOCOBALAMIN ) 100 MCG tablet Take 1 tablet (100 mcg total) by mouth daily. 30 tablet 0   lisinopril  (ZESTRIL ) 10 MG tablet Take 1 tablet (10 mg total) by mouth daily. 90 tablet 3   No current facility-administered medications for this visit.    Allergies:   Chantix  [varenicline  tartrate], Wellbutrin [bupropion], Codeine, and Dilaudid [hydromorphone hcl]    Social History:  The patient  reports that she has been smoking cigarettes. She has a 12.5 pack-year smoking history. She has never used smokeless tobacco. She reports current drug use. Drug: Marijuana. She reports that she does not drink alcohol.   Family History:  The patient's family history includes Alcohol abuse (age of onset: 75) in her father; Congestive Heart Failure (age of onset: 1) in her mother; Heart disease in her brother; Liver disease in her sister.    ROS:  Please see the history of present illness.   Otherwise, review of systems are positive for none.   All other systems are reviewed and negative.    PHYSICAL EXAM: VS:  BP (!) 144/76 (BP Location: Left Arm, Patient Position: Sitting)   Pulse 100   Ht 5\' 4"  (1.626 m)   Wt 156 lb 9.6 oz (71 kg)   LMP 03/26/2017   SpO2 99%   BMI 26.88 kg/m  , BMI Body mass index is 26.88 kg/m. GEN: Well nourished, well developed, in no acute distress  HEENT: normal  Neck: no JVD, carotid bruits, or masses Cardiac: RRR; no  rubs, or gallops,no edema .  1/6 systolic murmur in the aortic area. Respiratory:  clear to auscultation bilaterally, normal work of breathing GI: soft, nontender, nondistended, + BS MS: no deformity  or atrophy  Skin: warm and dry, no rash Neuro:  Strength and sensation are intact Psych: euthymic mood, full affect   EKG:  EKG is not ordered today.    Recent Labs: 07/18/2023: ALT 7; BUN 14; Creatinine, Ser 0.83; Potassium 3.5; Sodium 140 08/01/2023: Hemoglobin 10.3; Platelets 437    Lipid Panel    Component Value Date/Time   CHOL 182 10/09/2022 1117   TRIG 242 (H) 10/09/2022 1117   HDL 28 (L) 10/09/2022 1117   CHOLHDL 6.5 (H) 10/09/2022 1117   CHOLHDL 7.8 11/27/2015 0453   VLDL 66 (H) 11/27/2015 0453   LDLCALC 112 (H) 10/09/2022 1117      Wt Readings from Last 3 Encounters:  08/06/23 156 lb 9.6 oz (71 kg)  07/16/23 159 lb 6.4 oz (72.3 kg)  01/23/23 159 lb 6.4 oz (72.3 kg)           No data to display            ASSESSMENT AND PLAN:  1.  Peripheral arterial disease: Status post bilateral iliac artery stenting.  She now presents with subtotal occlusion in the left with severe progression of symptoms.  She has severe claudication and some rest pain as well.  In addition, considering how diseased the area was on duplex, there is a high risk of acute stent closure.   Due to that, I recommend proceeding with urgent abdominal aortogram with lower extremity angiography and possible endovascular intervention.  I discussed the procedure in details as well as risk and benefits.   She usually requires significant amount of sedation during the procedure.   She is on dual antiplatelet therapy.   The CT scan findings of fluid collection around the iliac stents is likely inflammatory in nature.  No clinical evidence of infection.  Angiography will help determine if there is any evidence of pseudoaneurysm formation.  2.  Previous tobacco use: No relapse since last year.  3. Coronary artery disease involving native coronary arteries without angina: Continue medical therapy.    4. Hyperlipidemia: Continue high-dose rosuvastatin .  5.  Blood loss anemia: Likely due to known  anal fissure with plans for surgical correction in the near future.  This might have to be postponed given anticipated need for continued dual antiplatelet therapy.     Disposition:   Proceed with urgent abdominal aortogram with lower extremity angiography and follow-up after.  Signed,  Antionette Kirks, MD  08/06/2023 5:01 PM    [facility]

## 2023-08-06 NOTE — H&P (View-Only) (Signed)
 Cardiology Office Note   Date:  08/06/2023   ID:  Robin Walsh, DOB 06-27-1965, MRN [id number]  PCP:  Joaquin Mulberry, MD  Cardiologist:  Dr. Renna Cary  No chief complaint on file.     History of Present Illness: Robin Walsh is a 58 y.o. female who presents for a follow-up visit regarding  peripheral arterial disease. She has known history of coronary artery disease, hypertension, hyperlipidemia and tobacco use. She is status post LAD PCI in 2017. She is status post bilateral common iliac artery kissing stent placement in 12/2015 for severe claudication. She had no significant infrainguinal disease. She had recurrent claudication in 2020 due to severe in-stent restenosis.  Preprocedure labs revealed severe anemia with a hemoglobin of 7.5 and thus the procedure was postponed.  She was hospitalized and transfused.  She was then seen by GI and had EGD and colonoscopy.  She had few polyps removed and she also reports having an anal fissure that was causing the bleeding.  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  I performed successful drug-coated balloon angioplasty to bilateral common iliac arteries.    She had recurrent severe claudication last year.  Angiography was performed in July which showed occluded right common iliac artery stent and severe stenosis in the left common iliac artery stent.  I performed successful endovascular intervention to bilateral common iliac arteries with placement of 2 kissing covered stents.  A self-expanding stent was added on the right side due to flow-limiting dissection.  She quit smoking last year.  She now returns with severe left leg claudication and occasional symptoms at rest as well.  She had recent Doppler studies which showed subtotal occlusion of the left common iliac artery stent with peak velocity of close to 700 in the distal segment.  She had recent issues with  blood loss anemia related to anal fissure and underwent transfusion.  She had CT scan of the abdomen which showed patent iliac stents with adjacent inflammation and small fluid collection.  She has no infection symptoms and no reported fevers.  Recent labs showed no evidence of leukocytosis.  I suspect that this is inflammatory in nature.    Past Medical History:  Diagnosis Date   Anal fissure    Anemia    CAD (coronary artery disease)    Hyperlipidemia    Hypertension     Past Surgical History:  Procedure Laterality Date   ABDOMINAL AORTOGRAM W/LOWER EXTREMITY N/A 01/14/2019   Procedure: ABDOMINAL AORTOGRAM W/LOWER EXTREMITY;  Surgeon: Wenona Hamilton, MD;  Location: [facility];  Service: Cardiovascular;  Laterality: N/A;  limited   ABDOMINAL AORTOGRAM W/LOWER EXTREMITY N/A 10/24/2022   Procedure: ABDOMINAL AORTOGRAM W/LOWER EXTREMITY;  Surgeon: Wenona Hamilton, MD;  Location: [facility];  Service: Cardiovascular;  Laterality: N/A;   CARDIAC CATHETERIZATION N/A 11/28/2015   Procedure: Left Heart Cath and Coronary Angiography;  Surgeon: Lucendia Rusk, MD;  Location: [facility] IN[facility];  Service: Cardiovascular;  Laterality: N/A;   CARDIAC CATHETERIZATION N/A 11/28/2015   Procedure: Coronary Stent Intervention;  Surgeon: Lucendia Rusk, MD;  Location: [facility] INVASIVE CV LAB;  Service: Cardiovascular;  Laterality: N/A;  DES to Mid LAD; 3.0x12 Synergy   LEG SURGERY Left 2002?   tibial plateau fracture   PERIPHERAL VASCULAR BALLOON ANGIOPLASTY Bilateral 01/14/2019   Procedure: PERIPHERAL VASCULAR BALLOON ANGIOPLASTY;  Surgeon: Wenona Hamilton, MD;  Location: [facility];  Service: Cardiovascular;  Laterality: Bilateral;  common iliac   PERIPHERAL VASCULAR BALLOON ANGIOPLASTY Bilateral 10/24/2022   Procedure: PERIPHERAL VASCULAR BALLOON ANGIOPLASTY;  Surgeon: Wenona Hamilton, MD;  Location: [facility];  Service: Cardiovascular;  Laterality: Bilateral;  L  and R common iliac   PERIPHERAL VASCULAR CATHETERIZATION N/A 01/04/2016   Procedure: Abdominal Aortogram;  Surgeon: Sammy Crisp, MD;  Location: [facility];  Service: Cardiovascular;  Laterality: N/A;   PERIPHERAL VASCULAR CATHETERIZATION Bilateral 01/04/2016   Procedure: Lower Extremity Angiography;  Surgeon: Sammy Crisp, MD;  Location: [facility] INVASIVE CV LAB;  Service: Cardiovascular;  Laterality: Bilateral;   PERIPHERAL VASCULAR CATHETERIZATION Bilateral 01/04/2016   Procedure: Peripheral Vascular Intervention;  Surgeon: Sammy Crisp, MD;  Location: [facility];  Service: Cardiovascular;  Laterality: Bilateral;  common iliac   PERIPHERAL VASCULAR INTERVENTION Bilateral 10/24/2022   Procedure: PERIPHERAL VASCULAR INTERVENTION;  Surgeon: Wenona Hamilton, MD;  Location: [facility];  Service: Cardiovascular;  Laterality: Bilateral;  R and L common iliac     Current Outpatient Medications  Medication Sig Dispense Refill   albuterol  (VENTOLIN  HFA) 108 (90 Base) MCG/ACT inhaler Inhale 1-2 puffs into the lungs every 6 (six) hours as needed for wheezing or shortness of breath. 8.5 g 2   aspirin  81 MG chewable tablet Chew 1 tablet (81 mg total) by mouth daily. (Patient taking differently: Chew 81 mg by mouth every evening.) 30 tablet 11   clopidogrel  (PLAVIX ) 75 MG tablet Take 1 tablet (75 mg total) by mouth daily. 90 tablet 3   ferrous gluconate  (FERGON) 324 MG tablet Take 1 tablet (324 mg total) by mouth 2 (two) times daily with a meal. 60 tablet 3   ferrous sulfate  325 (65 FE) MG tablet Take 1 tablet (325 mg total) by mouth daily. 30 tablet 0   gabapentin  (NEURONTIN ) 300 MG capsule Take 2 capsules (600 mg total) by mouth every morning AND 3 capsules (900 mg total) every evening. 150 capsule 2   nitroGLYCERIN  (NITROSTAT ) 0.4 MG SL tablet Place 1 tablet (0.4 mg total) under the tongue every 5 (five) minutes as needed for chest pain. X 3 doses 25 tablet 1   oxybutynin   (DITROPAN -XL) 10 MG 24 hr tablet Take 1 tablet (10 mg total) by mouth at bedtime. 30 tablet 5   polyethylene glycol powder (GLYCOLAX /MIRALAX ) 17 GM/SCOOP powder Mix 1 capful (17 g) into at least 4-8 ounces of water/juice and take by mouth daily. 510 g 1   rosuvastatin  (CRESTOR ) 40 MG tablet Take 1 tablet (40 mg total) by mouth daily. 90 tablet 1   vitamin B-12 (CYANOCOBALAMIN ) 100 MCG tablet Take 1 tablet (100 mcg total) by mouth daily. 30 tablet 0   lisinopril  (ZESTRIL ) 10 MG tablet Take 1 tablet (10 mg total) by mouth daily. 90 tablet 3   No current facility-administered medications for this visit.    Allergies:   Chantix  [varenicline  tartrate], Wellbutrin [bupropion], Codeine, and Dilaudid [hydromorphone hcl]    Social History:  The patient  reports that she has been smoking cigarettes. She has a 12.5 pack-year smoking history. She has never used smokeless tobacco. She reports current drug use. Drug: Marijuana. She reports that she does not drink alcohol.   Family History:  The patient's family history includes Alcohol abuse (age of onset: 75) in her father; Congestive Heart Failure (age of onset: 1) in her mother; Heart disease in her brother; Liver disease in her sister.    ROS:  Please see the history of present illness.   Otherwise, review of systems are positive for none.   All other systems are reviewed and negative.    PHYSICAL EXAM: VS:  BP (!) 144/76 (BP Location: Left Arm, Patient Position: Sitting)   Pulse 100   Ht 5\' 4"  (1.626 m)   Wt 156 lb 9.6 oz (71 kg)   LMP 03/26/2017   SpO2 99%   BMI 26.88 kg/m  , BMI Body mass index is 26.88 kg/m. GEN: Well nourished, well developed, in no acute distress  HEENT: normal  Neck: no JVD, carotid bruits, or masses Cardiac: RRR; no  rubs, or gallops,no edema .  1/6 systolic murmur in the aortic area. Respiratory:  clear to auscultation bilaterally, normal work of breathing GI: soft, nontender, nondistended, + BS MS: no deformity  or atrophy  Skin: warm and dry, no rash Neuro:  Strength and sensation are intact Psych: euthymic mood, full affect   EKG:  EKG is not ordered today.    Recent Labs: 07/18/2023: ALT 7; BUN 14; Creatinine, Ser 0.83; Potassium 3.5; Sodium 140 08/01/2023: Hemoglobin 10.3; Platelets 437    Lipid Panel    Component Value Date/Time   CHOL 182 10/09/2022 1117   TRIG 242 (H) 10/09/2022 1117   HDL 28 (L) 10/09/2022 1117   CHOLHDL 6.5 (H) 10/09/2022 1117   CHOLHDL 7.8 11/27/2015 0453   VLDL 66 (H) 11/27/2015 0453   LDLCALC 112 (H) 10/09/2022 1117      Wt Readings from Last 3 Encounters:  08/06/23 156 lb 9.6 oz (71 kg)  07/16/23 159 lb 6.4 oz (72.3 kg)  01/23/23 159 lb 6.4 oz (72.3 kg)           No data to display            ASSESSMENT AND PLAN:  1.  Peripheral arterial disease: Status post bilateral iliac artery stenting.  She now presents with subtotal occlusion in the left with severe progression of symptoms.  She has severe claudication and some rest pain as well.  In addition, considering how diseased the area was on duplex, there is a high risk of acute stent closure.   Due to that, I recommend proceeding with urgent abdominal aortogram with lower extremity angiography and possible endovascular intervention.  I discussed the procedure in details as well as risk and benefits.   She usually requires significant amount of sedation during the procedure.   She is on dual antiplatelet therapy.   The CT scan findings of fluid collection around the iliac stents is likely inflammatory in nature.  No clinical evidence of infection.  Angiography will help determine if there is any evidence of pseudoaneurysm formation.  2.  Previous tobacco use: No relapse since last year.  3. Coronary artery disease involving native coronary arteries without angina: Continue medical therapy.    4. Hyperlipidemia: Continue high-dose rosuvastatin .  5.  Blood loss anemia: Likely due to known  anal fissure with plans for surgical correction in the near future.  This might have to be postponed given anticipated need for continued dual antiplatelet therapy.     Disposition:   Proceed with urgent abdominal aortogram with lower extremity angiography and follow-up after.  Signed,  Antionette Kirks, MD  08/06/2023 5:01 PM    [facility]

## 2023-08-06 NOTE — Patient Instructions (Signed)
 Medication Instructions:  No changes *If you need a refill on your cardiac medications before your next appointment, please call your pharmacy*  Lab Work: Your provider would like for you to have the following labs today: BMET and CBC  If you have labs (blood work) drawn today and your tests are completely normal, you will receive your results only by: MyChart Message (if you have MyChart) OR A paper copy in the mail If you have any lab test that is abnormal or we need to change your treatment, we will call you to review the results.  Testing/Procedures: Your physician has requested that you have a peripheral vascular angiogram. This exam is performed at the hospital. During this exam IV contrast is used to look at arterial blood flow. Please review the information sheet given for details.   Follow-Up: At [facility], you and your health needs are our priority.  As part of our continuing mission to provide you with exceptional heart care, our providers are all part of one team.  This team includes your primary Cardiologist (physician) and Advanced Practice Providers or APPs (Physician Assistants and Nurse Practitioners) who all work together to provide you with the care you need, when you need it.  Your next appointment:   Follow up with Dr. Alvenia Aus on 5/20  We recommend signing up for the patient portal called "MyChart".  Sign up information is provided on this After Visit Summary.  MyChart is used to connect with patients for Virtual Visits (Telemedicine).  Patients are able to view lab/test results, encounter notes, upcoming appointments, etc.  Non-urgent messages can be sent to your provider as well.   To learn more about what you can do with MyChart, go to [email or web address].   Other Instructions  [facility] A DEPT OF [place] AT [address] A DEPT OF THE [place]. [facility] [address] [place] [address] Dept:  [phone] Loc: [phone]  Robin Walsh  08/06/2023  You are scheduled for a Peripheral Angiogram on Wednesday, April 30 with Dr. Antionette Kirks.  1. Please arrive at the [facility] (Main Entrance A) at [facility]: [address] [place], [place] [address] at 9:30 AM (This time is 2 hour(s) before your procedure to ensure your preparation).   Free valet parking service is available. You will check in at ADMITTING. The support person will be asked to wait in the waiting room.  It is OK to have someone drop you off and come back when you are ready to be discharged.    Special note: Every effort is made to have your procedure done on time. Please understand that emergencies sometimes delay scheduled procedures.  2. Diet: Do not eat solid foods after midnight.  The patient may have clear liquids until 5am upon the day of the procedure.  3. Labs: You will need to have blood drawn on 08/06/23. You do not need to be fasting.  4. Medication instructions in preparation for your procedure: Nothing to hold  On the morning of your procedure, take your Aspirin  81 mg and Plavix /Clopidogrel  and any morning medicines NOT listed above.  You may use sips of water.  5. Plan to go home the same day, you will only stay overnight if medically necessary. 6. Bring a current list of your medications and current insurance cards. 7. You MUST have a responsible person to drive you home. 8. Someone MUST be with you the first  24 hours after you arrive home or your discharge will be delayed. 9. Please wear clothes that are easy to get on and off and wear slip-on shoes.  Thank you for allowing us  to care for you!   -- [place] [facility]

## 2023-08-07 ENCOUNTER — Other Ambulatory Visit: Payer: Self-pay | Admitting: *Deleted

## 2023-08-07 ENCOUNTER — Ambulatory Visit (HOSPITAL_COMMUNITY)
Admission: RE | Admit: 2023-08-07 | Discharge: 2023-08-07 | Disposition: A | Discharge status: Home / Self Care | Attending: Cardiovascular Disease | Admitting: Cardiovascular Disease

## 2023-08-07 ENCOUNTER — Encounter (HOSPITAL_COMMUNITY)
Admission: RE | Disposition: A | Discharge status: Home / Self Care | Payer: Self-pay | Source: Home / Self Care | Attending: Cardiovascular Disease

## 2023-08-07 ENCOUNTER — Other Ambulatory Visit: Payer: Self-pay

## 2023-08-07 DIAGNOSIS — E785 Hyperlipidemia, unspecified: Secondary | ICD-10-CM | POA: Insufficient documentation

## 2023-08-07 DIAGNOSIS — Z7902 Long term (current) use of antithrombotics/antiplatelets: Secondary | ICD-10-CM | POA: Diagnosis not present

## 2023-08-07 DIAGNOSIS — Z87891 Personal history of nicotine dependence: Secondary | ICD-10-CM | POA: Diagnosis not present

## 2023-08-07 DIAGNOSIS — I2510 Atherosclerotic heart disease of native coronary artery without angina pectoris: Secondary | ICD-10-CM | POA: Insufficient documentation

## 2023-08-07 DIAGNOSIS — Z955 Presence of coronary angioplasty implant and graft: Secondary | ICD-10-CM | POA: Diagnosis not present

## 2023-08-07 DIAGNOSIS — D500 Iron deficiency anemia secondary to blood loss (chronic): Secondary | ICD-10-CM | POA: Insufficient documentation

## 2023-08-07 DIAGNOSIS — I70212 Atherosclerosis of native arteries of extremities with intermittent claudication, left leg: Secondary | ICD-10-CM

## 2023-08-07 DIAGNOSIS — Z79899 Other long term (current) drug therapy: Secondary | ICD-10-CM | POA: Diagnosis not present

## 2023-08-07 DIAGNOSIS — I739 Peripheral vascular disease, unspecified: Secondary | ICD-10-CM

## 2023-08-07 DIAGNOSIS — I10 Essential (primary) hypertension: Secondary | ICD-10-CM | POA: Insufficient documentation

## 2023-08-07 HISTORY — PX: LOWER EXTREMITY ANGIOGRAPHY: CATH118251

## 2023-08-07 HISTORY — PX: ABDOMINAL AORTOGRAM: CATH118222

## 2023-08-07 HISTORY — PX: LOWER EXTREMITY INTERVENTION: CATH118252

## 2023-08-07 LAB — BASIC METABOLIC PANEL WITH GFR
BUN/Creatinine Ratio: 11 (ref 9–23)
BUN: 11 mg/dL (ref 6–24)
CO2: 18 mmol/L — ABNORMAL LOW (ref 20–29)
Calcium: 9.6 mg/dL (ref 8.7–10.2)
Chloride: 105 mmol/L (ref 96–106)
Creatinine, Ser: 0.96 mg/dL (ref 0.57–1.00)
Glucose: 99 mg/dL (ref 70–99)
Potassium: 5.1 mmol/L (ref 3.5–5.2)
Sodium: 142 mmol/L (ref 134–144)
eGFR: 70 mL/min/{1.73_m2} (ref >59)

## 2023-08-07 LAB — CBC
Hematocrit: 37.9 % (ref 34.0–46.6)
Hemoglobin: 10.8 g/dL — ABNORMAL LOW (ref 11.1–15.9)
MCH: 22.5 pg — ABNORMAL LOW (ref 26.6–33.0)
MCHC: 28.5 g/dL — ABNORMAL LOW (ref 31.5–35.7)
MCV: 79 fL (ref 79–97)
Platelets: 482 10*3/uL — ABNORMAL HIGH (ref 150–450)
RBC: 4.79 x10E6/uL (ref 3.77–5.28)
RDW: 20.3 % — ABNORMAL HIGH (ref 11.7–15.4)
WBC: 9.5 10*3/uL (ref 3.4–10.8)

## 2023-08-07 SURGERY — LOWER EXTREMITY ANGIOGRAPHY
Anesthesia: LOCAL

## 2023-08-07 MED ORDER — CLOPIDOGREL BISULFATE 300 MG PO TABS
ORAL_TABLET | ORAL | Status: AC
  Filled 2023-08-07: qty 1

## 2023-08-07 MED ORDER — ONDANSETRON HCL 4 MG/2ML IJ SOLN
4.0000 mg | Freq: Four times a day (QID) | INTRAMUSCULAR | Status: DC | PRN
Start: 2023-08-07 — End: 2023-08-07

## 2023-08-07 MED ORDER — FENTANYL CITRATE (PF) 100 MCG/2ML IJ SOLN
INTRAMUSCULAR | Status: AC
  Filled 2023-08-07: qty 2

## 2023-08-07 MED ORDER — LIDOCAINE HCL (PF) 1 % IJ SOLN
INTRAMUSCULAR | Status: DC | PRN
  Administered 2023-08-07 (×2): 10 mL

## 2023-08-07 MED ORDER — MIDAZOLAM HCL 2 MG/2ML IJ SOLN
INTRAMUSCULAR | Status: AC
  Filled 2023-08-07: qty 2

## 2023-08-07 MED ORDER — HEPARIN SODIUM (PORCINE) 1000 UNIT/ML IJ SOLN
INTRAMUSCULAR | Status: AC
  Filled 2023-08-07: qty 10

## 2023-08-07 MED ORDER — HEPARIN SODIUM (PORCINE) 1000 UNIT/ML IJ SOLN
INTRAMUSCULAR | Status: DC | PRN
  Administered 2023-08-07: 5000 [IU] via INTRAVENOUS

## 2023-08-07 MED ORDER — CLOPIDOGREL BISULFATE 300 MG PO TABS
ORAL_TABLET | ORAL | Status: DC | PRN
  Administered 2023-08-07: 300 mg via ORAL

## 2023-08-07 MED ORDER — SODIUM CHLORIDE 0.9% FLUSH: 3.0000 mL | INTRAVENOUS | Status: DC | PRN

## 2023-08-07 MED ORDER — SODIUM CHLORIDE 0.9 % IV SOLN: 250.0000 mL | INTRAVENOUS | Status: DC | PRN

## 2023-08-07 MED ORDER — ACETAMINOPHEN 325 MG PO TABS
ORAL_TABLET | ORAL | Status: DC
Start: 2023-08-07 — End: 2023-08-07
  Filled 2023-08-07: qty 2

## 2023-08-07 MED ORDER — ACETAMINOPHEN 325 MG PO TABS
650.0000 mg | ORAL_TABLET | ORAL | Status: DC | PRN
  Administered 2023-08-07: 650 mg via ORAL

## 2023-08-07 MED ORDER — ASPIRIN 81 MG PO CHEW: 81.0000 mg | CHEWABLE_TABLET | ORAL | Status: DC

## 2023-08-07 MED ORDER — LIDOCAINE HCL (PF) 1 % IJ SOLN
INTRAMUSCULAR | Status: AC
Start: 2023-08-07 — End: ?
  Filled 2023-08-07: qty 30

## 2023-08-07 MED ORDER — FENTANYL CITRATE (PF) 100 MCG/2ML IJ SOLN
INTRAMUSCULAR | Status: DC | PRN
  Administered 2023-08-07: 25 ug via INTRAVENOUS
  Administered 2023-08-07: 50 ug via INTRAVENOUS
  Administered 2023-08-07: 25 ug via INTRAVENOUS

## 2023-08-07 MED ORDER — LABETALOL HCL 5 MG/ML IV SOLN: 10.0000 mg | INTRAVENOUS | Status: DC | PRN

## 2023-08-07 MED ORDER — SODIUM CHLORIDE 0.9 % IV SOLN: INTRAVENOUS | Status: DC

## 2023-08-07 MED ORDER — SODIUM CHLORIDE 0.9% FLUSH: 3.0000 mL | Freq: Two times a day (BID) | INTRAVENOUS | Status: DC

## 2023-08-07 MED ORDER — HEPARIN (PORCINE) IN NACL 1000-0.9 UT/500ML-% IV SOLN
INTRAVENOUS | Status: DC | PRN
  Administered 2023-08-07 (×2): 500 mL

## 2023-08-07 MED ORDER — MIDAZOLAM HCL 2 MG/2ML IJ SOLN
INTRAMUSCULAR | Status: DC | PRN
  Administered 2023-08-07 (×2): 1 mg via INTRAVENOUS

## 2023-08-07 SURGICAL SUPPLY — 16 items
BAG SNAP BAND KOVER 36X36 (MISCELLANEOUS) IMPLANT
BALLOON MUSTANG 7.0X20 75 (BALLOONS) IMPLANT
CATH ANGIO 5F PIGTAIL 65CM (CATHETERS) IMPLANT
COVER DOME SNAP 22 D (MISCELLANEOUS) IMPLANT
DEVICE CLOSURE MYNXGRIP 5F (Vascular Products) IMPLANT
DEVICE CLOSURE MYNXGRIP 6/7F (Vascular Products) IMPLANT
KIT ENCORE 26 ADVANTAGE (KITS) IMPLANT
KIT MICROPUNCTURE NIT STIFF (SHEATH) IMPLANT
SET ATX-X65L (MISCELLANEOUS) IMPLANT
SHEATH BRITE TIP 6FR 35CM (SHEATH) IMPLANT
SHEATH PINNACLE 5F 10CM (SHEATH) IMPLANT
SHEATH PROBE COVER 6X72 (BAG) IMPLANT
STENT ABSOLUTE PRO 8X30X135 (Permanent Stent) IMPLANT
TRAY PV CATH (CUSTOM PROCEDURE TRAY) ×2 IMPLANT
WIRE HITORQ VERSACORE ST 145CM (WIRE) IMPLANT
WIRE VERSACORE LOC 115CM (WIRE) IMPLANT

## 2023-08-07 NOTE — Progress Notes (Signed)
Patient and sister was given discharge instructions. Both verbalized understanding. 

## 2023-08-07 NOTE — Discharge Instructions (Signed)
 Femoral Site Care This sheet gives you information about how to care for yourself after your procedure. Your health care provider may also give you more specific instructions. If you have problems or questions, contact your health care provider. What can I expect after the procedure?  After the procedure, it is common to have: Bruising that usually fades within 1-2 weeks. Tenderness at the site. Follow these instructions at home: Wound care Follow instructions from your health care provider about how to take care of your insertion site. Make sure you: Wash your hands with soap and water before you change your bandage (dressing). If soap and water are not available, use hand sanitizer. Remove your dressing as told by your health care provider. 24 hours Do not take baths, swim, or use a hot tub until your health care provider approves. You may shower 24-48 hours after the procedure or as told by your health care provider. Gently wash the site with plain soap and water. Pat the area dry with a clean towel. Do not rub the site. This may cause bleeding. Do not apply powder or lotion to the site. Keep the site clean and dry. Check your femoral site every day for signs of infection. Check for: Redness, swelling, or pain. Fluid or blood. Warmth. Pus or a bad smell. Activity For the first 2-3 days after your procedure, or as long as directed: Avoid climbing stairs as much as possible. Do not squat. Do not lift anything that is heavier than 10 lb (4.5 kg), or the limit that you are told, until your health care provider says that it is safe. For 5 days Rest as directed. Avoid sitting for a long time without moving. Get up to take short walks every 1-2 hours. Do not drive for 24 hours if you were given a medicine to help you relax (sedative). General instructions Take over-the-counter and prescription medicines only as told by your health care provider. Keep all follow-up visits as told by your  health care provider. This is important. Contact a health care provider if you have: A fever or chills. You have redness, swelling, or pain around your insertion site. Get help right away if: The catheter insertion area swells very fast. You Lwin out. You suddenly start to sweat or your skin gets clammy. The catheter insertion area is bleeding, and the bleeding does not stop when you hold steady pressure on the area. The area near or just beyond the catheter insertion site becomes pale, cool, tingly, or numb. These symptoms may represent a serious problem that is an emergency. Do not wait to see if the symptoms will go away. Get medical help right away. Call your local emergency services (911 in the [place] Do not drive yourself to the hospital. Summary After the procedure, it is common to have bruising that usually fades within 1-2 weeks. Check your femoral site every day for signs of infection. Do not lift anything that is heavier than 10 lb (4.5 kg), or the limit that you are told, until your health care provider says that it is safe. This information is not intended to replace advice given to you by your health care provider. Make sure you discuss any questions you have with your health care provider. Document Revised: 04/08/2017 Document Reviewed: 04/08/2017 Elsevier Patient Education  2020 [employer].

## 2023-08-07 NOTE — Interval H&P Note (Signed)
 History and Physical Interval Note:  08/07/2023 11:22 AM  Robin Walsh  has presented today for surgery, with the diagnosis of pad.  The various methods of treatment have been discussed with the patient and family. After consideration of risks, benefits and other options for treatment, the patient has consented to  Procedure(s): Lower Extremity Angiography (N/A) as a surgical intervention.  The patient's history has been reviewed, patient examined, no change in status, stable for surgery.  I have reviewed the patient's chart and labs.  Questions were answered to the patient's satisfaction.     [name]

## 2023-08-08 ENCOUNTER — Encounter (HOSPITAL_COMMUNITY): Payer: Self-pay | Admitting: Cardiovascular Disease

## 2023-08-08 LAB — POCT ACTIVATED CLOTTING TIME: Activated Clotting Time: 222 s

## 2023-08-16 ENCOUNTER — Other Ambulatory Visit (HOSPITAL_COMMUNITY): Payer: Self-pay

## 2023-08-16 ENCOUNTER — Other Ambulatory Visit: Payer: Self-pay

## 2023-08-19 ENCOUNTER — Other Ambulatory Visit: Payer: Self-pay

## 2023-08-19 DIAGNOSIS — K648 Other hemorrhoids: Secondary | ICD-10-CM | POA: Diagnosis not present

## 2023-08-19 DIAGNOSIS — K644 Residual hemorrhoidal skin tags: Secondary | ICD-10-CM | POA: Diagnosis not present

## 2023-08-19 MED ORDER — CLOPIDOGREL BISULFATE 75 MG PO TABS
75.0000 mg | ORAL_TABLET | Freq: Every day | ORAL | 3 refills | Status: DC
  Filled 2023-08-19: qty 90, 90d supply, fill #0

## 2023-08-26 ENCOUNTER — Ambulatory Visit (HOSPITAL_COMMUNITY)
Admission: RE | Admit: 2023-08-26 | Discharge: 2023-08-26 | Disposition: A | Discharge status: Home / Self Care | Source: Ambulatory Visit | Attending: Cardiovascular Disease | Admitting: Cardiovascular Disease

## 2023-08-26 DIAGNOSIS — I739 Peripheral vascular disease, unspecified: Secondary | ICD-10-CM | POA: Diagnosis not present

## 2023-08-26 DIAGNOSIS — Z95828 Presence of other vascular implants and grafts: Secondary | ICD-10-CM | POA: Diagnosis not present

## 2023-08-26 NOTE — Progress Notes (Signed)
 Cardiology Office Note   Date:  08/27/2023   ID:  Robin Walsh, DOB [birth year], MRN [id number]  PCP:  Joaquin Mulberry, MD  Cardiologist:  Dr. Renna Cary  No chief complaint on file.     History of Present Illness: Robin Walsh is a 58 y.o. female who presents for a follow-up visit regarding  peripheral arterial disease. She has known history of coronary artery disease, hypertension, hyperlipidemia and tobacco use. She is status post LAD PCI in 2017. She is status post bilateral common iliac artery kissing stent placement in 12/2015 for severe claudication. She had no significant infrainguinal disease. She had recurrent claudication in 2020 due to severe in-stent restenosis.  Preprocedure labs revealed severe anemia with a hemoglobin of 7.5 and thus the procedure was postponed.  She was hospitalized and transfused.  She was then seen by GI and had EGD and colonoscopy.  She had few polyps removed and she also reports having an anal fissure that was causing the bleeding.  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  I performed successful drug-coated balloon angioplasty to bilateral common iliac arteries.    She had recurrent severe claudication last year.  Angiography was performed in July which showed occluded right common iliac artery stent and severe stenosis in the left common iliac artery stent.  I performed successful endovascular intervention to bilateral common iliac arteries with placement of 2 kissing covered stents.  A self-expanding stent was added on the right side due to flow-limiting dissection.  She quit smoking last year.  She now returns with severe left leg claudication and occasional symptoms at rest as well.  She had recent Doppler studies which showed subtotal occlusion of the left common iliac artery stent with peak velocity of close to 700 in the distal segment.  She had recent issues with  blood loss anemia related to anal fissure and underwent transfusion.  She had CT scan of the abdomen which showed patent iliac stents with adjacent inflammation and small fluid collection.   She was seen recently for severe left leg claudication with evidence of subtotal occlusion of left common iliac artery stent in the distal segment.  I proceeded with angiography few weeks ago which showed patent bilateral common iliac artery stent with severe distal edge restenosis on the left side and small pouching of the aorta behind the right iliac stent with no evidence of pseudoaneurysm.  I performed successful self-expanding stent placement to the left common iliac artery to overlap the previously placed stent.  Postprocedure ABI improved to normal. She reports complete resolution of claudication and she is doing extremely well.  No chest pain or shortness of breath    Past Medical History:  Diagnosis Date   Anal fissure    Anemia    CAD (coronary artery disease)    Hyperlipidemia    Hypertension     Past Surgical History:  Procedure Laterality Date   ABDOMINAL AORTOGRAM N/A 08/07/2023   Procedure: ABDOMINAL AORTOGRAM;  Surgeon: Wenona Hamilton, MD;  Location: [facility];  Service: Cardiovascular;  Laterality: N/A;   ABDOMINAL AORTOGRAM W/LOWER EXTREMITY N/A 01/14/2019   Procedure: ABDOMINAL AORTOGRAM W/LOWER EXTREMITY;  Surgeon: Wenona Hamilton, MD;  Location: [facility];  Service: Cardiovascular;  Laterality: N/A;  limited   ABDOMINAL AORTOGRAM W/LOWER EXTREMITY N/A 10/24/2022   Procedure: ABDOMINAL AORTOGRAM W/LOWER EXTREMITY;  Surgeon: Wenona Hamilton, MD;  Location: [facility];  Service: Cardiovascular;  Laterality: N/A;   CARDIAC CATHETERIZATION N/A 11/28/2015   Procedure: Left Heart Cath and Coronary Angiography;  Surgeon: Lucendia Rusk, MD;  Location[facility] INVAS[facility] CV LAB;  Service: Cardiovascular;  Laterality: N/A;   CARDIAC CATHETERIZATION N/A 11/28/2015    Procedure: Coronary Stent Intervention;  Surgeon: Lucendia Rusk, MD;  Location: [facility] INVASIVE CV LAB;  Service: Cardiovascular;  Laterality: N/A;  DES to Mid LAD; 3.0x12 Synergy   LEG SURGERY Left 2002?   tibial plateau fracture   LOWER EXTREMITY ANGIOGRAPHY N/A 08/07/2023   Procedure: Lower Extremity Angiography;  Surgeon: Wenona Hamilton, MD;  Location: [facility];  Service: Cardiovascular;  Laterality: N/A;   LOWER EXTREMITY INTERVENTION Left 08/07/2023   Procedure: LOWER EXTREMITY INTERVENTION;  Surgeon: Wenona Hamilton, MD;  Location: [facility];  Service: Cardiovascular;  Laterality: Left;   PERIPHERAL VASCULAR BALLOON ANGIOPLASTY Bilateral 01/14/2019   Procedure: PERIPHERAL VASCULAR BALLOON ANGIOPLASTY;  Surgeon: Wenona Hamilton, MD;  Location: [facility];  Service: Cardiovascular;  Laterality: Bilateral;  common iliac   PERIPHERAL VASCULAR BALLOON ANGIOPLASTY Bilateral 10/24/2022   Procedure: PERIPHERAL VASCULAR BALLOON ANGIOPLASTY;  Surgeon: Wenona Hamilton, MD;  Location: [facility];  Service: Cardiovascular;  Laterality: Bilateral;  L and R common iliac   PERIPHERAL VASCULAR CATHETERIZATION N/A 01/04/2016   Procedure: Abdominal Aortogram;  Surgeon: Sammy Crisp, MD;  Location: [facility];  Service: Cardiovascular;  Laterality: N/A;   PERIPHERAL VASCULAR CATHETERIZATION Bilateral 01/04/2016   Procedure: Lower Extremity Angiography;  Surgeon: Sammy Crisp, MD;  Location[facility] INVASIVE CV LAB;  Service: Cardiovascular;  Laterality: Bilateral;   PERIPHERAL VASCULAR CATHETERIZATION Bilateral 01/04/2016   Procedure: Peripheral Vascular Intervention;  Surgeon: Sammy Crisp, MD;  Location: [facility];  Service: Cardiovascular;  Laterality: Bilateral;  common iliac   PERIPHERAL VASCULAR INTERVENTION Bilateral 10/24/2022   Procedure: PERIPHERAL VASCULAR INTERVENTION;  Surgeon: Wenona Hamilton, MD;  Location: [facility];  Service:  Cardiovascular;  Laterality: Bilateral;  R and L common iliac     Current Outpatient Medications  Medication Sig Dispense Refill   albuterol  (VENTOLIN  HFA) 108 (90 Base) MCG/ACT inhaler Inhale 1-2 puffs into the lungs every 6 (six) hours as needed for wheezing or shortness of breath. 8.5 g 2   aspirin  81 MG chewable tablet Chew 1 tablet (81 mg total) by mouth daily. (Patient taking differently: Chew 81 mg by mouth every evening.) 30 tablet 11   clopidogrel  (PLAVIX ) 75 MG tablet Take 1 tablet (75 mg total) by mouth daily. 90 tablet 3   gabapentin  (NEURONTIN ) 300 MG capsule Take 2 capsules (600 mg total) by mouth every morning AND 3 capsules (900 mg total) every evening. 150 capsule 2   lisinopril  (ZESTRIL ) 10 MG tablet Take 1 tablet (10 mg total) by mouth daily. 90 tablet 3   nitroGLYCERIN  (NITROSTAT ) 0.4 MG SL tablet Place 1 tablet (0.4 mg total) under the tongue every 5 (five) minutes as needed for chest pain. X 3 doses 25 tablet 1   oxybutynin  (DITROPAN -XL) 10 MG 24 hr tablet Take 1 tablet (10 mg total) by mouth at bedtime. 30 tablet 5   polyethylene glycol powder (GLYCOLAX /MIRALAX ) 17 GM/SCOOP powder Mix 1 capful (17 g) into at least 4-8 ounces of water/juice and take by mouth daily. (Patient taking differently: Take 17 g by mouth daily as needed.) 510 g 1   rosuvastatin  (CRESTOR ) 40 MG tablet Take 1 tablet (40 mg  total) by mouth daily. 90 tablet 1   vitamin B-12 (CYANOCOBALAMIN ) 100 MCG tablet Take 1 tablet (100 mcg total) by mouth daily. 30 tablet 0   ferrous gluconate  (FERGON) 324 MG tablet Take 1 tablet (324 mg total) by mouth 2 (two) times daily with a meal. (Patient not taking: Reported on 08/27/2023) 60 tablet 3   ferrous sulfate  325 (65 FE) MG tablet Take 1 tablet (325 mg total) by mouth daily. (Patient not taking: Reported on 08/27/2023) 30 tablet 0   No current facility-administered medications for this visit.    Allergies:   Chantix  [varenicline  tartrate], Wellbutrin [bupropion],  Codeine, and Dilaudid [hydromorphone hcl]    Social History:  The patient  reports that she has been smoking cigarettes. She has a 12.5 pack-year smoking history. She has never used smokeless tobacco. She reports current drug use. Drug: Marijuana. She reports that she does not drink alcohol.   Family History:  The patient's family history includes Alcohol abuse (age of onset: 46) in her father; Congestive Heart Failure (age of onset: 67) in her mother; Heart disease in her brother; Liver disease in her sister.    ROS:  Please see the history of present illness.   Otherwise, review of systems are positive for none.   All other systems are reviewed and negative.    PHYSICAL EXAM: VS:  BP (!) 98/52 (BP Location: Right Arm, Patient Position: Sitting)   Ht 5\' 4"  (1.626 m)   Wt 160 lb 6.4 oz (72.8 kg)   LMP 03/26/2017   BMI 27.53 kg/m  , BMI Body mass index is 27.53 kg/m. GEN: Well nourished, well developed, in no acute distress  HEENT: normal  Neck: no JVD, or masses.  Faint left carotid bruit Cardiac: RRR; no  rubs, or gallops,no edema .  1/6 systolic murmur in the aortic area. Respiratory:  clear to auscultation bilaterally, normal work of breathing GI: soft, nontender, nondistended, + BS MS: no deformity or atrophy  Skin: warm and dry, no rash Neuro:  Strength and sensation are intact Psych: euthymic mood, full affect   EKG:  EKG is not ordered today.    Recent Labs: 07/18/2023: ALT 7 08/06/2023: BUN 11; Creatinine, Ser 0.96; Hemoglobin 10.8; Platelets 482; Potassium 5.1; Sodium 142    Lipid Panel    Component Value Date/Time   CHOL 182 10/09/2022 1117   TRIG 242 (H) 10/09/2022 1117   HDL 28 (L) 10/09/2022 1117   CHOLHDL 6.5 (H) 10/09/2022 1117   CHOLHDL 7.8 11/27/2015 0453   VLDL 66 (H) 11/27/2015 0453   LDLCALC 112 (H) 10/09/2022 1117      Wt Readings from Last 3 Encounters:  08/27/23 160 lb 6.4 oz (72.8 kg)  08/07/23 160 lb (72.6 kg)  08/06/23 156 lb 9.6 oz (71  kg)           No data to display            ASSESSMENT AND PLAN:  1.  Peripheral arterial disease: Status post bilateral iliac artery stenting.  Most recently, there was severe distal edge restenosis on the left side which was treated successfully with self-expanding stent placement with excellent results.  Postprocedure ABI improved to normal and she is currently asymptomatic.  Repeat Doppler studies in 6 months.  Continue lifelong dual antiplatelet therapy as tolerated.    2.  Previous tobacco use: No relapse since last year.  3. Coronary artery disease involving native coronary arteries without angina: Continue medical therapy.  4. Hyperlipidemia: Continue high-dose rosuvastatin .  I requested lipid and liver profile  5.  Prediabetes: Will check hemoglobin A1c with her labs.    Disposition:   Follow-up in 6 months.    Signed,  Antionette Kirks, MD  08/27/2023 8:50 AM    [facility]

## 2023-08-27 ENCOUNTER — Ambulatory Visit: Attending: Cardiovascular Disease | Admitting: Cardiovascular Disease

## 2023-08-27 VITALS — BP 98/52 | Ht 64.0 in | Wt 160.4 lb

## 2023-08-27 DIAGNOSIS — Z79899 Other long term (current) drug therapy: Secondary | ICD-10-CM

## 2023-08-27 DIAGNOSIS — I2510 Atherosclerotic heart disease of native coronary artery without angina pectoris: Secondary | ICD-10-CM

## 2023-08-27 DIAGNOSIS — I739 Peripheral vascular disease, unspecified: Secondary | ICD-10-CM | POA: Diagnosis not present

## 2023-08-27 DIAGNOSIS — I10 Essential (primary) hypertension: Secondary | ICD-10-CM | POA: Diagnosis not present

## 2023-08-27 DIAGNOSIS — E785 Hyperlipidemia, unspecified: Secondary | ICD-10-CM | POA: Diagnosis not present

## 2023-08-27 LAB — LIPID PANEL
Chol/HDL Ratio: 3.9 ratio (ref 0.0–4.4)
Cholesterol, Total: 126 mg/dL (ref 100–199)
HDL: 32 mg/dL — ABNORMAL LOW (ref >39)
LDL Chol Calc (NIH): 57 mg/dL (ref 0–99)
Triglycerides: 229 mg/dL — ABNORMAL HIGH (ref 0–149)
VLDL Cholesterol Cal: 37 mg/dL (ref 5–40)

## 2023-08-27 NOTE — Patient Instructions (Signed)
 Medication Instructions:  The current medical regimen is effective;  continue present plan and medications.  *If you need a refill on your cardiac medications before your next appointment, please call your pharmacy*  Lab Work: Please have blood work today (Lipid, liver and Ha1c)  If you have labs (blood work) drawn today and your tests are completely normal, you will receive your results only by: MyChart Message (if you have MyChart) OR A paper copy in the mail If you have any lab test that is abnormal or we need to change your treatment, we will call you to review the results.  Testing/Procedures: Your physician has requested that you have an ankle brachial index (ABI) with aorta iliac doppler in 6 months. During this test an ultrasound and blood pressure cuff are used to evaluate the arteries that supply the arms and legs with blood. Allow thirty minutes for this exam. There are no restrictions or special instructions.  Please note: We ask at that you not bring children with you during ultrasound (echo/ vascular) testing. Due to room size and safety concerns, children are not allowed in the ultrasound rooms during exams. Our front office staff cannot provide observation of children in our lobby area while testing is being conducted. An adult accompanying a patient to their appointment will only be allowed in the ultrasound room at the discretion of the ultrasound technician under special circumstances. We apologize for any inconvenience.   Follow-Up: At [facility], you and your health needs are our priority.  As part of our continuing mission to provide you with exceptional heart care, our providers are all part of one team.  This team includes your primary Cardiologist (physician) and Advanced Practice Providers or APPs (Physician Assistants and Nurse Practitioners) who all work together to provide you with the care you need, when you need it.  Your next appointment:   6  month(s)  Provider:   Dr Alvenia Aus     We recommend signing up for the patient portal called "MyChart".  Sign up information is provided on this After Visit Summary.  MyChart is used to connect with patients for Virtual Visits (Telemedicine).  Patients are able to view lab/test results, encounter notes, upcoming appointments, etc.  Non-urgent messages can be sent to your provider as well.   To learn more about what you can do with MyChart, go to [email or web address].

## 2023-08-28 ENCOUNTER — Ambulatory Visit: Payer: Self-pay | Admitting: Cardiovascular Disease

## 2023-08-28 ENCOUNTER — Ambulatory Visit: Payer: Self-pay | Admitting: *Deleted

## 2023-08-28 DIAGNOSIS — I739 Peripheral vascular disease, unspecified: Secondary | ICD-10-CM

## 2023-08-28 LAB — HEPATIC FUNCTION PANEL
ALT: 5 IU/L (ref 0–32)
AST: 10 IU/L (ref 0–40)
Albumin: 4.3 g/dL (ref 3.8–4.9)
Alkaline Phosphatase: 83 IU/L (ref 44–121)
Bilirubin Total: <0.2 mg/dL (ref 0.0–1.2)
Bilirubin, Direct: <0.08 mg/dL (ref 0.00–0.40)
Total Protein: 7.1 g/dL (ref 6.0–8.5)

## 2023-08-28 LAB — HEMOGLOBIN A1C
Est. average glucose Bld gHb Est-mCnc: 117 mg/dL
Hgb A1c MFr Bld: 5.7 % — ABNORMAL HIGH (ref 4.8–5.6)

## 2023-08-29 LAB — VAS US ABI WITH/WO TBI
Left ABI: 1.1
Right ABI: 1.16

## 2023-10-04 ENCOUNTER — Other Ambulatory Visit: Payer: Self-pay | Admitting: Cardiovascular Disease

## 2023-10-04 ENCOUNTER — Other Ambulatory Visit: Payer: Self-pay

## 2023-10-04 MED FILL — Clopidogrel Bisulfate Tab 75 MG (Base Equiv): ORAL | 90 days supply | Qty: 90 | Fill #0 | Status: CN

## 2023-10-08 ENCOUNTER — Other Ambulatory Visit: Payer: Self-pay

## 2023-11-03 ENCOUNTER — Encounter: Payer: Self-pay | Admitting: Family Medicine

## 2023-11-04 ENCOUNTER — Other Ambulatory Visit: Payer: Self-pay | Admitting: Family Medicine

## 2023-11-04 ENCOUNTER — Other Ambulatory Visit: Payer: Self-pay

## 2023-11-04 DIAGNOSIS — N951 Menopausal and female climacteric states: Secondary | ICD-10-CM

## 2023-11-04 MED ORDER — VEOZAH 45 MG PO TABS
45.0000 mg | ORAL_TABLET | Freq: Every day | ORAL | 1 refills | Status: DC
  Filled 2023-11-04 – 2023-11-11 (×2): qty 90, 90d supply, fill #0
  Filled 2023-11-13: qty 30, 30d supply, fill #0
  Filled 2023-11-13: qty 3, 3d supply, fill #0
  Filled 2023-11-13 – 2023-11-14 (×2): qty 30, 30d supply, fill #0
  Filled 2023-12-12: qty 30, 30d supply, fill #1
  Filled 2024-01-09: qty 30, 30d supply, fill #2
  Filled 2024-02-05: qty 30, 30d supply, fill #3
  Filled 2024-03-12: qty 30, 30d supply, fill #4
  Filled 2024-04-08: qty 30, 30d supply, fill #5

## 2023-11-04 MED FILL — Clopidogrel Bisulfate Tab 75 MG (Base Equiv): ORAL | 90 days supply | Qty: 90 | Fill #0 | Status: AC

## 2023-11-05 ENCOUNTER — Other Ambulatory Visit: Payer: Self-pay

## 2023-11-05 MED ORDER — GABAPENTIN 300 MG PO CAPS
ORAL_CAPSULE | ORAL | 2 refills | Status: DC
  Filled 2023-11-05: qty 150, 30d supply, fill #0
  Filled 2023-12-12: qty 150, 30d supply, fill #1
  Filled 2024-01-09: qty 150, 30d supply, fill #2

## 2023-11-06 ENCOUNTER — Other Ambulatory Visit: Payer: Self-pay

## 2023-11-07 ENCOUNTER — Other Ambulatory Visit: Payer: Self-pay

## 2023-11-11 ENCOUNTER — Other Ambulatory Visit: Payer: Self-pay

## 2023-11-13 ENCOUNTER — Other Ambulatory Visit (HOSPITAL_BASED_OUTPATIENT_CLINIC_OR_DEPARTMENT_OTHER): Payer: Self-pay

## 2023-11-13 ENCOUNTER — Other Ambulatory Visit: Payer: Self-pay

## 2023-11-13 ENCOUNTER — Telehealth: Payer: Self-pay

## 2023-11-13 NOTE — Telephone Encounter (Signed)
 VEOZAH  PRIOR AUTHORIZATION CASE RE-OPENED TODAY. EXPEDITED REQUEST WITH A 24 HOUR TURNAROUND TIME. UPDATED CASE # [id number]. FAXED CHART NOTES TO [phone] PER TISH AT [facility] 11/13/2023 3PM

## 2023-11-14 ENCOUNTER — Telehealth: Payer: Self-pay

## 2023-11-14 ENCOUNTER — Other Ambulatory Visit (HOSPITAL_BASED_OUTPATIENT_CLINIC_OR_DEPARTMENT_OTHER): Payer: Self-pay

## 2023-11-14 ENCOUNTER — Other Ambulatory Visit: Payer: Self-pay

## 2023-11-14 NOTE — Telephone Encounter (Signed)
 Pharmacy Patient Advocate Encounter  Received notification from [facility] that Prior Authorization for VEOZAH  has been APPROVED from 11/13/2023 to 11/12/2024

## 2023-12-13 ENCOUNTER — Other Ambulatory Visit (HOSPITAL_COMMUNITY): Payer: Self-pay

## 2023-12-13 ENCOUNTER — Other Ambulatory Visit: Payer: Self-pay

## 2023-12-20 ENCOUNTER — Other Ambulatory Visit: Payer: Self-pay | Admitting: Internal Medicine

## 2023-12-20 ENCOUNTER — Ambulatory Visit: Attending: Family Medicine | Admitting: Internal Medicine

## 2023-12-20 ENCOUNTER — Other Ambulatory Visit: Payer: Self-pay

## 2023-12-20 VITALS — BP 113/67 | HR 84 | Ht 64.0 in | Wt 170.0 lb

## 2023-12-20 DIAGNOSIS — F419 Anxiety disorder, unspecified: Secondary | ICD-10-CM | POA: Diagnosis not present

## 2023-12-20 DIAGNOSIS — M5431 Sciatica, right side: Secondary | ICD-10-CM

## 2023-12-20 DIAGNOSIS — F321 Major depressive disorder, single episode, moderate: Secondary | ICD-10-CM | POA: Diagnosis not present

## 2023-12-20 DIAGNOSIS — Z1231 Encounter for screening mammogram for malignant neoplasm of breast: Secondary | ICD-10-CM

## 2023-12-20 DIAGNOSIS — N63 Unspecified lump in unspecified breast: Secondary | ICD-10-CM | POA: Diagnosis not present

## 2023-12-20 DIAGNOSIS — N951 Menopausal and female climacteric states: Secondary | ICD-10-CM

## 2023-12-20 MED ORDER — SERTRALINE HCL 25 MG PO TABS
ORAL_TABLET | ORAL | 0 refills | Status: DC
  Filled 2023-12-20: qty 40, 30d supply, fill #0

## 2023-12-20 MED ORDER — PREDNISONE 20 MG PO TABS
ORAL_TABLET | ORAL | 0 refills | Status: AC
  Filled 2023-12-20: qty 8, 7d supply, fill #0

## 2023-12-20 MED ORDER — ALBUTEROL SULFATE HFA 108 (90 BASE) MCG/ACT IN AERS
1.0000 | INHALATION_SPRAY | Freq: Four times a day (QID) | RESPIRATORY_TRACT | 2 refills | Status: AC | PRN
  Filled 2023-12-20: qty 18, 25d supply, fill #0
  Filled 2024-02-05: qty 18, 25d supply, fill #1

## 2023-12-20 NOTE — Patient Instructions (Signed)
 VISIT SUMMARY:  Today, you were seen for right leg pain, severe hot flashes, and symptoms of depression and anxiety. We discussed your current medications and their effectiveness, and we have made some adjustments to better manage your symptoms. Additionally, we addressed a superficial cyst in your left breast and planned further evaluation.  YOUR PLAN:  -RIGHT LOWER EXTREMITY SCIATICA: Sciatica is pain that radiates along the path of the sciatic nerve, which runs from your lower back through your hips and buttocks and down each leg. You can take Tylenol  500 mg up to four times daily as needed for pain. We have also prescribed a prednisone  taper to help reduce inflammation. If there is no improvement with prednisone , we may consider physical therapy.  -MENOPAUSAL SYMPTOMS WITH SEVERE HOT FLASHES: Menopausal symptoms, including severe hot flashes, are common and can significantly impact your quality of life. You are currently taking gabapentin  and Veozah , which provide some relief. We will continue with these medications and monitor your symptoms.  -DEPRESSION AND ANXIETY SYMPTOMS: Depression and anxiety can be exacerbated by menopause and other stressors. We have prescribed Zoloft  25 mg daily for three weeks, then increasing to 50 mg daily to help manage these symptoms. Additionally, we will refer you for behavioral health counseling to provide further support.  -SUPERFICIAL CYST, LEFT BREAST: A superficial cyst is a fluid-filled sac just under the skin. You have a cyst in your left breast that has decreased in size with manipulation. We will order a mammogram to evaluate the breast tissue and refer you to a surgeon for further evaluation and potential removal of the cyst.  INSTRUCTIONS:  Please follow up with the prescribed medications and attend the behavioral health counseling sessions. Schedule the mammogram and the appointment with the surgeon as soon as possible. If your symptoms do not improve  or if you have any concerns, please contact our office.

## 2023-12-20 NOTE — Progress Notes (Signed)
 "   Patient ID: Robin Walsh, female    DOB: Jun 23, 1965  MRN: [id number]  CC: Leg Pain (R leg pain X1 mo/Increased anxiety /Malaise, joint aching - worse in the a.m. /)   Subjective: Robin Walsh is a 58 y.o. female who presents for UC visit.  PCP is Dr. Delbert whom she last saw 07/2023 Her concerns today include:  Patient with history of tobacco dependence, CAD, PAD  (status post stenting of bilateral common iliac artery), abdominal aortogram with bilateral peripheral vascular balloon angioplasty of bilateral common iliac with placement of 2 kissing covered stents in 10/2022),  anemia  Discussed the use of AI scribe software for clinical note transcription with the patient, who gave verbal consent to proceed.  History of Present Illness Robin Walsh is a 58 year old female with a history of sciatica who presents with right leg pain.  She has been experiencing pain in her right leg, specifically in the sciatic region, for about a month to a month and a half. The pain is located at the back of the leg and radiates down to the ankle. It is described as a constant dull ache that worsens with standing, twisting, turning, and bending. The pain is intermittent, and she adjusts her posture to alleviate the pressure. She recalls a similar episode 15-20 years ago.  She is currently taking Tylenol  500 mg as needed for pain but limits its use due to her other medications. She is also on gabapentin , 600 mg in the morning and 900 mg in the evening, primarily for hot flashes. She avoids ibuprofen due to her heart medications and takes an 81 mg aspirin  daily.  She experiences severe hot flashes, having two to three episodes per hour, which significantly impact her quality of life. She is on Veozah  and Gabapentin  for hot flashes, which provides some relief, but she continues to experience intense symptoms. She has 13 fans in her house and carries a small one in her purse to manage the  sweating.  She reports increased anxiety and feelings of being overwhelmed, possibly related to menopause, hot flashes, or unemployment. She describes feeling like 'the weight of the world's on my shoulders' and experiences a lack of motivation and frequent crying. She has tried various medications in the past, including Cymbalta , Celexa, and one other, with some success but discontinued them for unspecified reasons.  She has a family history of liver and kidney issues, which makes her cautious about medication use. She reports sweating all the time, lack of sleep, and feelings of depression and anxiety. No suicidal ideation.  Also concern about a superficial nodule that catches the upper inner quadrant of the LT breast; present x 1 mth. Sore and has been able to express cheezy white material when she squeezes it.    Patient Active Problem List   Diagnosis Date Noted   Symptomatic anemia 10/23/2018   Hemorrhoids 10/23/2018   Leukocytosis 10/23/2018   Renal insufficiency 10/23/2018   Tobacco abuse 10/23/2018   Vitamin D  deficiency 02/11/2018   Hot flash, menopausal 02/06/2017   PAD (peripheral artery disease) (HCC) 01/04/2016   Medication management 12/09/2015   Chest pain    Abnormal finding on EKG - anterior T-wave inversions concerning for LAD ischemia 11/28/2015   CAD S/P DES PCI TO mLAD - Synergy DES 3.0 mm x 12 mm 11/28/2015   Unstable angina (HCC) 11/27/2015   Essential hypertension 11/27/2015   Dyslipidemia, goal LDL below 70 11/27/2015   Tobacco dependence  11/27/2015   Noncompliance 11/27/2015   Marijuana dependence (HCC) 11/27/2015   Right hip pain 11/27/2015     Current Outpatient Medications on File Prior to Visit  Medication Sig Dispense Refill   aspirin  81 MG chewable tablet Chew 1 tablet (81 mg total) by mouth daily. 30 tablet 11   clopidogrel  (PLAVIX ) 75 MG tablet Take 1 tablet (75 mg total) by mouth daily. 90 tablet 3   Fezolinetant  (VEOZAH ) 45 MG TABS Take 1  tablet (45 mg total) by mouth daily. 90 tablet 1   gabapentin  (NEURONTIN ) 300 MG capsule Take 2 capsules (600 mg total) by mouth every morning AND 3 capsules (900 mg total) every evening. 150 capsule 2   lisinopril  (ZESTRIL ) 10 MG tablet Take 1 tablet (10 mg total) by mouth daily. 90 tablet 3   nitroGLYCERIN  (NITROSTAT ) 0.4 MG SL tablet Place 1 tablet (0.4 mg total) under the tongue every 5 (five) minutes as needed for chest pain. X 3 doses 25 tablet 1   rosuvastatin  (CRESTOR ) 40 MG tablet Take 1 tablet (40 mg total) by mouth daily. 90 tablet 1   vitamin B-12 (CYANOCOBALAMIN ) 100 MCG tablet Take 1 tablet (100 mcg total) by mouth daily. 30 tablet 0   ferrous gluconate  (FERGON) 324 MG tablet Take 1 tablet (324 mg total) by mouth 2 (two) times daily with a meal. (Patient not taking: Reported on 12/20/2023) 60 tablet 3   ferrous sulfate  325 (65 FE) MG tablet Take 1 tablet (325 mg total) by mouth daily. (Patient not taking: Reported on 12/20/2023) 30 tablet 0   oxybutynin  (DITROPAN -XL) 10 MG 24 hr tablet Take 1 tablet (10 mg total) by mouth at bedtime. (Patient not taking: Reported on 12/20/2023) 30 tablet 5   polyethylene glycol powder (GLYCOLAX /MIRALAX ) 17 GM/SCOOP powder Mix 1 capful (17 g) into at least 4-8 ounces of water/juice and take by mouth daily. (Patient not taking: Reported on 12/20/2023) 510 g 1   No current facility-administered medications on file prior to visit.    Allergies  Allergen Reactions   Chantix  [Varenicline  Tartrate] Nausea Only   Wellbutrin [Bupropion] Itching   Codeine Nausea And Vomiting   Dilaudid [Hydromorphone Hcl] Nausea And Vomiting    Social History   Socioeconomic History   Marital status: Single    Spouse name: Not on file   Number of children: 0   Years of education: Not on file   Highest education level: GED or equivalent  Occupational History   Occupation: clerical work  Tobacco Use   Smoking status: Every Day    Current packs/day: 0.50    Average  packs/day: 0.5 packs/day for 25.0 years (12.5 ttl pk-yrs)    Types: Cigarettes   Smokeless tobacco: Never  Vaping Use   Vaping status: Never Used  Substance and Sexual Activity   Alcohol use: No   Drug use: Yes    Types: Marijuana    Comment: one week ago   Sexual activity: Not on file  Other Topics Concern   Not on file  Social History Narrative   Not on file   Social Drivers of Health   Financial Resource Strain: Patient Declined (07/16/2023)   Overall Financial Resource Strain (CARDIA)    Difficulty of Paying Living Expenses: Patient declined  Food Insecurity: Patient Declined (07/16/2023)   Hunger Vital Sign    Worried About Running Out of Food in the Last Year: Patient declined    Ran Out of Food in the Last Year: Patient declined  Transportation  Needs: No Transportation Needs (07/16/2023)   PRAPARE - Administrator, Civil Service (Medical): No    Lack of Transportation (Non-Medical): No  Physical Activity: Unknown (07/16/2023)   Exercise Vital Sign    Days of Exercise per Week: Patient declined    Minutes of Exercise per Session: 30 min  Stress: Stress Concern Present (07/16/2023)   [employer] of Occupational Health - Occupational Stress Questionnaire    Feeling of Stress : Rather much  Social Connections: Unknown (07/16/2023)   Social Connection and Isolation Panel    Frequency of Communication with Friends and Family: More than three times a week    Frequency of Social Gatherings with Friends and Family: Once a week    Attends Religious Services: Patient declined    Database Administrator or Organizations: No    Attends Banker Meetings: Never    Marital Status: Living with partner  Intimate Partner Violence: Not At Risk (01/23/2023)   Humiliation, Afraid, Rape, and Kick questionnaire    Fear of Current or Ex-Partner: No    Emotionally Abused: No    Physically Abused: No    Sexually Abused: No    Family History  Problem Relation Age of  Onset   Congestive Heart Failure Mother 64   Alcohol abuse Father 19   Heart disease Brother    Liver disease Sister        kidney/liver    Past Surgical History:  Procedure Laterality Date   ABDOMINAL AORTOGRAM N/A 08/07/2023   Procedure: ABDOMINAL AORTOGRAM;  Surgeon: Darron Deatrice LABOR, MD;  Location: [facility];  Service: Cardiovascular;  Laterality: N/A;   ABDOMINAL AORTOGRAM W/LOWER EXTREMITY N/A 01/14/2019   Procedure: ABDOMINAL AORTOGRAM W/LOWER EXTREMITY;  Surgeon: Darron Deatrice LABOR, MD;  Location: [facility];  Service: Cardiovascular;  Laterality: N/A;  limited   ABDOMINAL AORTOGRAM W/LOWER EXTREMITY N/A 10/24/2022   Procedure: ABDOMINAL AORTOGRAM W/LOWER EXTREMITY;  Surgeon: Darron Deatrice LABOR, MD;  Location: [facility];  Service: Cardiovascular;  Laterality: N/A;   CARDIAC CATHETERIZATION N/A 11/28/2015   Procedure: Left Heart Cath and Coronary Angiography;  Surgeon: Candyce GORMAN Reek, MD;  Location: [facility] INV[facility]IVE CV LAB;  Service: Cardiovascular;  Laterality: N/A;   CARDIAC CATHETERIZATION N/A 11/28/2015   Procedure: Coronary Stent Intervention;  Surgeon: Candyce GORMAN Reek, MD;  Location: [facility] INVASIVE CV LAB;  Service: Cardiovascular;  Laterality: N/A;  DES to Mid LAD; 3.0x12 Synergy   LEG SURGERY Left 2002?   tibial plateau fracture   LOWER EXTREMITY ANGIOGRAPHY N/A 08/07/2023   Procedure: Lower Extremity Angiography;  Surgeon: Darron Deatrice LABOR, MD;  Location: [facility];  Service: Cardiovascular;  Laterality: N/A;   LOWER EXTREMITY INTERVENTION Left 08/07/2023   Procedure: LOWER EXTREMITY INTERVENTION;  Surgeon: Darron Deatrice LABOR, MD;  Location: [facility];  Service: Cardiovascular;  Laterality: Left;   PERIPHERAL VASCULAR BALLOON ANGIOPLASTY Bilateral 01/14/2019   Procedure: PERIPHERAL VASCULAR BALLOON ANGIOPLASTY;  Surgeon: Darron Deatrice LABOR, MD;  Location: [facility];  Service: Cardiovascular;  Laterality: Bilateral;  common iliac    PERIPHERAL VASCULAR BALLOON ANGIOPLASTY Bilateral 10/24/2022   Procedure: PERIPHERAL VASCULAR BALLOON ANGIOPLASTY;  Surgeon: Darron Deatrice LABOR, MD;  Location: [facility];  Service: Cardiovascular;  Laterality: Bilateral;  L and R common iliac   PERIPHERAL VASCULAR CATHETERIZATION N/A 01/04/2016   Procedure: Abdominal Aortogram;  Surgeon: Lonni Hanson, MD;  Location: [facility];  Service: Cardiovascular;  Laterality:  N/A;   PERIPHERAL VASCULAR CATHETERIZATION Bilateral 01/04/2016   Procedure: Lower Extremity Angiography;  Surgeon: Lonni Hanson, MD;  Location: [facility] INVASIVE CV LAB;  Service: Cardiovascular;  Laterality: Bilateral;   PERIPHERAL VASCULAR CATHETERIZATION Bilateral 01/04/2016   Procedure: Peripheral Vascular Intervention;  Surgeon: Lonni Hanson, MD;  Location: [facility];  Service: Cardiovascular;  Laterality: Bilateral;  common iliac   PERIPHERAL VASCULAR INTERVENTION Bilateral 10/24/2022   Procedure: PERIPHERAL VASCULAR INTERVENTION;  Surgeon: Darron Deatrice LABOR, MD;  Location: [facility];  Service: Cardiovascular;  Laterality: Bilateral;  R and L common iliac    ROS: Review of Systems Negative except as stated above  PHYSICAL EXAM: BP 113/67 (BP Location: Left Arm, Patient Position: Sitting, Cuff Size: Normal)   Pulse 84   Ht 5' 4 (1.626 m)   Wt 170 lb (77.1 kg)   LMP 03/26/2017   SpO2 99%   BMI 29.18 kg/m   Physical Exam  General appearance - alert, well appearing, older caucasian female and in no distress Mental status - pt tearful at times when talking about her menopausal symptoms Musculoskeletal - no tenderness on palpation of lumbar spine.  Straight leg raise on the right causes some discomfort in the lower back.  Gross sensation in both lower legs.  Power in both lower legs 5/5.  Good pedal pulses Skin - pt has a 1.5 cm firm moveable subcutaneous nodule that catches periphery of the upper inner quadrant of the LT breast. No  redness and non-tender to touch      12/20/2023   11:08 AM 07/16/2023    2:03 PM 01/23/2023   11:16 AM  Depression screen PHQ 2/9  Decreased Interest 3 3 3   Down, Depressed, Hopeless 2 3 2   PHQ - 2 Score 5 6 5   Altered sleeping 2 3 3   Tired, decreased energy 3 3 3   Change in appetite 1 1 2   Feeling bad or failure about yourself  0 0 2  Trouble concentrating 1 0 1  Moving slowly or fidgety/restless 0 0 1  Suicidal thoughts 0 0 0  PHQ-9 Score 12 13 17   Difficult doing work/chores Somewhat difficult        12/20/2023   11:08 AM 07/16/2023    2:04 PM 01/23/2023   11:16 AM 12/12/2022    8:53 AM  GAD 7 : Generalized Anxiety Score  Nervous, Anxious, on Edge 3 2 2 1   Control/stop worrying 2 1 1  0  Worry too much - different things 2 2 2 1   Trouble relaxing 2 1 1 1   Restless 1 0 1 0  Easily annoyed or irritable 2 1 0 0  Afraid - awful might happen 1 0 0 0  Total GAD 7 Score 13 7 7 3   Anxiety Difficulty Somewhat difficult           Latest Ref Rng & Units 08/27/2023    9:15 AM 08/06/2023   12:22 PM 07/18/2023    9:03 AM  CMP  Glucose 70 - 99 mg/dL  99  875   BUN 6 - 24 mg/dL  11  14   Creatinine 9.42 - 1.00 mg/dL  9.03  9.16   Sodium 865 - 144 mmol/L  142  140   Potassium 3.5 - 5.2 mmol/L  5.1  3.5   Chloride 96 - 106 mmol/L  105  108   CO2 20 - 29 mmol/L  18  22   Calcium  8.7 - 10.2 mg/dL  9.6  8.7   Total Protein 6.0 - 8.5 g/dL 7.1   7.4   Total Bilirubin 0.0 - 1.2 mg/dL <9.7   0.4   Alkaline Phos 44 - 121 IU/L 83   71   AST 0 - 40 IU/L 10   12   ALT 0 - 32 IU/L 5   7    Lipid Panel     Component Value Date/Time   CHOL 126 08/27/2023 0916   TRIG 229 (H) 08/27/2023 0916   HDL 32 (L) 08/27/2023 0916   CHOLHDL 3.9 08/27/2023 0916   CHOLHDL 7.8 11/27/2015 0453   VLDL 66 (H) 11/27/2015 0453   LDLCALC 57 08/27/2023 0916    CBC    Component Value Date/Time   WBC 9.5 08/06/2023 1222   WBC 8.5 07/18/2023 0903   RBC 4.79 08/06/2023 1222   RBC 3.68 (L) 07/18/2023 0903    RBC 3.71 (L) 07/18/2023 0903   HGB 10.8 (L) 08/06/2023 1222   HCT 37.9 08/06/2023 1222   PLT 482 (H) 08/06/2023 1222   MCV 79 08/06/2023 1222   MCH 22.5 (L) 08/06/2023 1222   MCH 20.7 (L) 07/18/2023 0903   MCHC 28.5 (L) 08/06/2023 1222   MCHC 27.2 (L) 07/18/2023 0903   RDW 20.3 (H) 08/06/2023 1222   LYMPHSABS 1.9 08/01/2023 1208   MONOABS 0.7 10/23/2018 1406   EOSABS 0.2 08/01/2023 1208   BASOSABS 0.0 08/01/2023 1208    ASSESSMENT AND PLAN: 1. Sciatica, right side (Primary) -Patient presenting with right-sided sciatica.  Patient is okay for her to grams up to 4 times a day as needed.  She reports good relief with prednisone  taper in the past so we will do it again.  Heating pad recommended.  Discussed sending her for some physical therapy as well but patient wants to hold on that for now.  Advised to avoid any heavy lifting, pushing or pulling. - predniSONE  (DELTASONE ) 20 MG tablet; Take 2 tablets (40 mg total) by mouth daily for 1 day, THEN 1.5 tablets (30 mg total) daily for 1 day, THEN 1 tablet (20 mg total) daily for 3 days, THEN 0.5 tablets (10 mg total) daily for 2 days.  Dispense: 8 tablet; Refill: 0  2. Hot flash, menopausal Continue Veozah  and gabapentin .  Not a good candidate for HRT given hx of CAD and tob use.  Discussed adding an SSRI to help with depression and anxiety symptoms which can also help in decreasing hot flashes.  She was agreeable to starting Zoloft .  3. Major depressive disorder, single episode, moderate (HCC) Patient with mood swings expressing depression and anxiety related to menopause.  We discussed starting her on the SSRI Zoloft  and referring to behavioral health.  Patient agreeable to both.  Denies any suicidal ideation at this time. - sertraline  (ZOLOFT ) 25 MG tablet; Take 1 tablet (25 mg total) by mouth daily for 21 days, THEN 2 tablets (50 mg total) daily.  Dispense: 40 tablet; Refill: 0 - Ambulatory referral to Psychiatry  4. Anxiety See #3  above - sertraline  (ZOLOFT ) 25 MG tablet; Take 1 tablet (25 mg total) by mouth daily for 21 days, THEN 2 tablets (50 mg total) daily.  Dispense: 40 tablet; Refill: 0 - Ambulatory referral to Psychiatry  5. Breast lump fixed to skin Looks like this may be a subcutaneous cyst but given that it is in the breast tissue, we also need to rule out other breast pathology.  It does not appear to be infected at this time.  Will refer her to a general surgeon to have her remove and will update her mammogram - Ambulatory referral to General Surgery - MM 3D DIAGNOSTIC MAMMOGRAM BILATERAL BREAST; Future  6. Encounter for screening mammogram for malignant neoplasm of breast - MM 3D DIAGNOSTIC MAMMOGRAM BILATERAL BREAST; Future  Patient was given the opportunity to ask questions.  Patient verbalized understanding of the plan and was able to repeat key elements of the plan.   This documentation was completed using Paediatric nurse.  Any transcriptional errors are unintentional.  Orders Placed This Encounter  Procedures   MM 3D DIAGNOSTIC MAMMOGRAM BILATERAL BREAST   Ambulatory referral to General Surgery   Ambulatory referral to Psychiatry     Requested Prescriptions   Signed Prescriptions Disp Refills   albuterol  (VENTOLIN  HFA) 108 (90 Base) MCG/ACT inhaler 18 g 2    Sig: Inhale 1-2 puffs into the lungs every 6 (six) hours as needed for wheezing or shortness of breath.   predniSONE  (DELTASONE ) 20 MG tablet 8 tablet 0    Sig: Take 2 tablets (40 mg total) by mouth daily for 1 day, THEN 1.5 tablets (30 mg total) daily for 1 day, THEN 1 tablet (20 mg total) daily for 3 days, THEN 0.5 tablets (10 mg total) daily for 2 days.   sertraline  (ZOLOFT ) 25 MG tablet 40 tablet 0    Sig: Take 1 tablet (25 mg total) by mouth daily for 21 days, THEN 2 tablets (50 mg total) daily.    Return in about 6 weeks (around 01/31/2024) for Give her 6 weeks f/u with Dr. Newlin.  Barnie Louder, MD,  FACP "

## 2023-12-21 ENCOUNTER — Encounter: Payer: Self-pay | Admitting: Internal Medicine

## 2024-01-10 ENCOUNTER — Other Ambulatory Visit: Payer: Self-pay

## 2024-02-03 ENCOUNTER — Encounter: Payer: Self-pay | Admitting: Family Medicine

## 2024-02-03 ENCOUNTER — Ambulatory Visit: Attending: Family Medicine | Admitting: Family Medicine

## 2024-02-03 ENCOUNTER — Other Ambulatory Visit: Payer: Self-pay

## 2024-02-03 VITALS — BP 118/69 | HR 94 | Temp 98.2°F | Ht 64.0 in | Wt 170.2 lb

## 2024-02-03 DIAGNOSIS — I2510 Atherosclerotic heart disease of native coronary artery without angina pectoris: Secondary | ICD-10-CM

## 2024-02-03 DIAGNOSIS — Z9861 Coronary angioplasty status: Secondary | ICD-10-CM

## 2024-02-03 DIAGNOSIS — F17210 Nicotine dependence, cigarettes, uncomplicated: Secondary | ICD-10-CM | POA: Diagnosis not present

## 2024-02-03 DIAGNOSIS — Z1211 Encounter for screening for malignant neoplasm of colon: Secondary | ICD-10-CM | POA: Diagnosis not present

## 2024-02-03 DIAGNOSIS — N951 Menopausal and female climacteric states: Secondary | ICD-10-CM

## 2024-02-03 DIAGNOSIS — M2550 Pain in unspecified joint: Secondary | ICD-10-CM | POA: Diagnosis not present

## 2024-02-03 DIAGNOSIS — I10 Essential (primary) hypertension: Secondary | ICD-10-CM | POA: Diagnosis not present

## 2024-02-03 DIAGNOSIS — M5431 Sciatica, right side: Secondary | ICD-10-CM | POA: Diagnosis not present

## 2024-02-03 MED ORDER — GABAPENTIN 300 MG PO CAPS
ORAL_CAPSULE | ORAL | 6 refills | Status: AC
  Filled 2024-02-03 – 2024-02-14 (×3): qty 150, 30d supply, fill #0
  Filled 2024-03-12: qty 150, 30d supply, fill #1
  Filled 2024-04-08: qty 150, 30d supply, fill #2
  Filled 2024-05-01 – 2024-05-07 (×2): qty 150, 30d supply, fill #3
  Filled ????-??-??: fill #3

## 2024-02-03 MED ORDER — LISINOPRIL 5 MG PO TABS
5.0000 mg | ORAL_TABLET | Freq: Every day | ORAL | 1 refills | Status: AC
  Filled 2024-02-03 – 2024-02-05 (×2): qty 90, 90d supply, fill #0
  Filled 2024-05-01: qty 90, 90d supply, fill #1

## 2024-02-03 MED ORDER — DULOXETINE HCL 60 MG PO CPEP
60.0000 mg | ORAL_CAPSULE | Freq: Every day | ORAL | 3 refills | Status: AC
  Filled 2024-02-03 – 2024-02-05 (×2): qty 30, 30d supply, fill #0
  Filled 2024-03-12: qty 30, 30d supply, fill #1
  Filled 2024-04-08: qty 30, 30d supply, fill #2
  Filled 2024-05-01: qty 30, 30d supply, fill #3

## 2024-02-03 MED ORDER — ROSUVASTATIN CALCIUM 40 MG PO TABS
40.0000 mg | ORAL_TABLET | Freq: Every day | ORAL | 1 refills | Status: AC
  Filled 2024-02-03: qty 90, 90d supply, fill #0
  Filled 2024-05-01: qty 90, 90d supply, fill #1

## 2024-02-03 NOTE — Patient Instructions (Signed)
 VISIT SUMMARY:  Today, you came in for a follow-up on your sciatica and to discuss your joint pain. We also reviewed your menopausal symptoms, hypertension, hyperlipidemia, and smoking history. You have made significant progress in quitting smoking, and we discussed various management plans for your conditions.  YOUR PLAN:  -RIGHT-SIDED SCIATICA: Sciatica is pain that radiates along the path of the sciatic nerve, which runs down one or both legs from the lower back. Your symptoms have improved with prednisone  but return after stopping the medication. We will refer you to physical therapy to help manage your sciatica.  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 You have chronic joint pain, especially in your right shoulder. We will prescribe duloxetine  for pain management and order an x-ray of your right shoulder. Based on the x-ray results, we may refer you to orthopedics for a possible shoulder injection. Additionally, we recommend aquatic therapy and water aerobics to help with your joint pain.  -MENOPAUSAL SYMPTOMS (HOT FLASHES, INSOMNIA): Menopause is the time that marks the end of your menstrual cycles, often causing symptoms like hot flashes and insomnia. We discussed your significant menopausal symptoms, including hot flashes and sleep disturbances.  -HYPERTENSION: Hypertension is high blood pressure. Your blood pressure is well-controlled with lisinopril . We will reduce your lisinopril  dose to 5 mg daily and monitor your blood pressure. Please schedule a follow-up in 3 months to assess your blood pressure and consider further reduction.  -HYPERLIPIDEMIA: Hyperlipidemia is having high levels of fats (lipids) in your blood, such as cholesterol and triglycerides. Your condition is managed with rosuvastatin . We will refill your prescription and order a lipid panel to reassess  your triglyceride levels.  -TOBACCO USE: You are a former smoker with over 20 years of smoking history and have recently quit smoking. Due to your smoking history, we recommend an annual chest CT for lung cancer screening.  -GENERAL HEALTH MAINTENANCE: You are due for routine screenings, including a colonoscopy and mammogram. We will order a colonoscopy as you prefer it over a stool test due to concerns about bleeding issues.  INSTRUCTIONS:  Please follow up in 3 months to assess your blood pressure and consider further reduction of lisinopril . Additionally, schedule your physical therapy sessions for sciatica management and aquatic therapy for joint pain. Ensure you complete your x-ray for the right shoulder and the lipid panel. Don't forget to schedule your annual chest CT and colonoscopy.

## 2024-02-03 NOTE — Progress Notes (Signed)
 Subjective:  Patient ID: Robin Walsh, female    DOB: 1966-01-13  Age: 58 y.o. MRN: [id number]  CC: Medical Management of Chronic Issues (Discuss Lisinopril  medication/Joint pain)     Discussed the use of AI scribe software for clinical note transcription with the patient, who gave verbal consent to proceed.  History of Present Illness Robin Walsh Sanna is a 58 year old female with a history of tobacco abuse ( 1 ppd since the age of 35 quit in 10/2022), coronary artery disease (status post PCI and drug-eluting stent placement to LAD in 11/2015) peripheral arterial disease (status post stenting of bilateral common iliac artery), abdominal aortogram with bilateral peripheral vascular balloon angioplasty of bilateral common iliac with placement of 2 kissing covered stents in 10/2022 who presents for follow-up on sciatica and to discuss joint pain.  Sciatica symptoms are well-controlled with prednisone  but return after discontinuation. Pain is on the right side, associated with activity such as reaching or standing up straight, and not present at rest.  She had a visit with Dr. Vicci last month for this.  Joint pain occurs primarily in the mornings, affecting ankles, knees, and right shoulder. Morning stiffness and pain make walking difficult, with the right shoulder pain radiating to the elbow.  Right shoulder pain is the worst pain of all her joint pains.  Tylenol  is ineffective, and NSAIDs are contraindicated due to Plavix  use. She has used Voltaren  gel previously.  She quit smoking after her last procedure. She experiences hot flashes and sleep disturbances, attributing them to menopause. She previously discontinued sertraline  due to increased hot flashes.   For her coronary artery disease and PAD she remains on Plavix  and is asymptomatic.  She continues to follow-up with Dr. Darron She is wondering if she can come off her lisinopril  as her blood pressure is controlled (of note she took  lisinopril  10 mg a day).  She states that a while ago when she was off lisinopril  blood pressure was still controlled.  Past Medical History:  Diagnosis Date   Anal fissure    Anemia    CAD (coronary artery disease)    Hyperlipidemia    Hypertension     Past Surgical History:  Procedure Laterality Date   ABDOMINAL AORTOGRAM N/A 08/07/2023   Procedure: ABDOMINAL AORTOGRAM;  Surgeon: Darron Deatrice DELENA, MD;  Location: [facility];  Service: Cardiovascular;  Laterality: N/A;   ABDOMINAL AORTOGRAM W/LOWER EXTREMITY N/A 01/14/2019   Procedure: ABDOMINAL AORTOGRAM W/LOWER EXTREMITY;  Surgeon: Darron Deatrice DELENA, MD;  Location: [facility];  Service: Cardiovascular;  Laterality: N/A;  limited   ABDOMINAL AORTOGRAM W/LOWER EXTREMITY N/A 10/24/2022   Procedure: ABDOMINAL AORTOGRAM W/LOWER EXTREMITY;  Surgeon: Darron Deatrice DELENA, MD;  Location: [facility];  Service: Cardiovascular;  Laterality: N/A;   CARDIAC CATHETERIZATION N/A 11/28/2015   Procedure: Left Heart Cath and Coronary Angiography;  Surgeon: Candyce GORMAN Reek, MD;  Location: [facility];  Service: Cardiovascular;  Laterality: N/A;   CARDIAC CATHETERIZATION N/A 11/28/2015   Procedure: Coronary Stent Intervention;  Surgeon: Candyce GORMAN Reek, MD;  Location: [facility];  Service: Cardiovascular;  Laterality: N/A;  DES to Mid LAD; 3.0x12 Synergy   LEG SURGERY Left 2002?   tibial plateau fracture   LOWER EXTREMITY ANGIOGRAPHY N/A 08/07/2023   Procedure: Lower Extremity Angiography;  Surgeon: Darron Deatrice DELENA, MD;  Location: [facility];  Service: Cardiovascular;  Laterality: N/A;   LOWER EXTREMITY INTERVENTION Left  08/07/2023   Procedure: LOWER EXTREMITY INTERVENTION;  Surgeon: Darron Deatrice LABOR, MD;  Location: [facility];  Service: Cardiovascular;  Laterality: Left;   PERIPHERAL VASCULAR BALLOON ANGIOPLASTY Bilateral 01/14/2019   Procedure: PERIPHERAL VASCULAR BALLOON ANGIOPLASTY;  Surgeon:  Darron Deatrice LABOR, MD;  Location: [facility];  Service: Cardiovascular;  Laterality: Bilateral;  common iliac   PERIPHERAL VASCULAR BALLOON ANGIOPLASTY Bilateral 10/24/2022   Procedure: PERIPHERAL VASCULAR BALLOON ANGIOPLASTY;  Surgeon: Darron Deatrice LABOR, MD;  Location: [facility];  Service: Cardiovascular;  Laterality: Bilateral;  L and R common iliac   PERIPHERAL VASCULAR CATHETERIZATION N/A 01/04/2016   Procedure: Abdominal Aortogram;  Surgeon: Lonni Hanson, MD;  Location: [facility];  Service: Cardiovascular;  Laterality: N/A;   PERIPHERAL VASCULAR CATHETERIZATION Bilateral 01/04/2016   Procedure: Lower Extremity Angiography;  Surgeon: Lonni Hanson, MD;  Location[facility] INV[facility] CV [facility];  Service: Cardiovascular;  Laterality: Bilateral;   PERIPHERAL VASCULAR CATHETERIZATION Bilateral 01/04/2016   Procedure: Peripheral Vascular Intervention;  Surgeon: Lonni Hanson, MD;  Location: [facility];  Service: Cardiovascular;  Laterality: Bilateral;  common iliac   PERIPHERAL VASCULAR INTERVENTION Bilateral 10/24/2022   Procedure: PERIPHERAL VASCULAR INTERVENTION;  Surgeon: Darron Deatrice LABOR, MD;  Location: [facility];  Service: Cardiovascular;  Laterality: Bilateral;  R and L common iliac    Family History  Problem Relation Age of Onset   Congestive Heart Failure Mother 66   Alcohol abuse Father 10   Heart disease Brother    Liver disease Sister        kidney/liver    Social History   Socioeconomic History   Marital status: Single    Spouse name: Not on file   Number of children: 0   Years of education: Not on file   Highest education level: GED or equivalent  Occupational History   Occupation: clerical work  Tobacco Use   Smoking status: Every Day    Current packs/day: 0.50    Average packs/day: 0.5 packs/day for 25.0 years (12.5 ttl pk-yrs)    Types: Cigarettes   Smokeless tobacco: Never  Vaping Use   Vaping status: Never Used  Substance  and Sexual Activity   Alcohol use: No   Drug use: Yes    Types: Marijuana    Comment: one week ago   Sexual activity: Not on file  Other Topics Concern   Not on file  Social History Narrative   Not on file   Social Drivers of Health   Financial Resource Strain: Patient Declined (07/16/2023)   Overall Financial Resource Strain (CARDIA)    Difficulty of Paying Living Expenses: Patient declined  Food Insecurity: Patient Declined (07/16/2023)   Hunger Vital Sign    Worried About Running Out of Food in the Last Year: Patient declined    Ran Out of Food in the Last Year: Patient declined  Transportation Needs: No Transportation Needs (07/16/2023)   PRAPARE - Administrator, Civil Service (Medical): No    Lack of Transportation (Non-Medical): No  Physical Activity: Unknown (07/16/2023)   Exercise Vital Sign    Days of Exercise per Week: Patient declined    Minutes of Exercise per Session: 30 min  Stress: Stress Concern Present (07/16/2023)   [employer] of Occupational Health - Occupational Stress Questionnaire    Feeling of Stress : Rather much  Social Connections: Unknown (07/16/2023)   Social Connection and Isolation Panel    Frequency of Communication with Friends and  Family: More than three times a week    Frequency of Social Gatherings with Friends and Family: Once a week    Attends Religious Services: Patient declined    Database Administrator or Organizations: No    Attends Engineer, Structural: Never    Marital Status: Living with partner    Allergies  Allergen Reactions   Chantix  [Varenicline  Tartrate] Nausea Only   Wellbutrin [Bupropion] Itching   Codeine Nausea And Vomiting   Dilaudid [Hydromorphone Hcl] Nausea And Vomiting    Outpatient Medications Prior to Visit  Medication Sig Dispense Refill   albuterol  (VENTOLIN  HFA) 108 (90 Base) MCG/ACT inhaler Inhale 1-2 puffs into the lungs every 6 (six) hours as needed for wheezing or shortness of  breath. 18 g 2   aspirin  81 MG chewable tablet Chew 1 tablet (81 mg total) by mouth daily. 30 tablet 11   clopidogrel  (PLAVIX ) 75 MG tablet Take 1 tablet (75 mg total) by mouth daily. 90 tablet 3   Fezolinetant  (VEOZAH ) 45 MG TABS Take 1 tablet (45 mg total) by mouth daily. 90 tablet 1   nitroGLYCERIN  (NITROSTAT ) 0.4 MG SL tablet Place 1 tablet (0.4 mg total) under the tongue every 5 (five) minutes as needed for chest pain. X 3 doses 25 tablet 1   gabapentin  (NEURONTIN ) 300 MG capsule Take 2 capsules (600 mg total) by mouth every morning AND 3 capsules (900 mg total) every evening. 150 capsule 2   lisinopril  (ZESTRIL ) 10 MG tablet Take 1 tablet (10 mg total) by mouth daily. 90 tablet 3   rosuvastatin  (CRESTOR ) 40 MG tablet Take 1 tablet (40 mg total) by mouth daily. 90 tablet 1   vitamin B-12 (CYANOCOBALAMIN ) 100 MCG tablet Take 1 tablet (100 mcg total) by mouth daily. 30 tablet 0   ferrous gluconate  (FERGON) 324 MG tablet Take 1 tablet (324 mg total) by mouth 2 (two) times daily with a meal. (Patient not taking: Reported on 12/20/2023) 60 tablet 3   ferrous sulfate  325 (65 FE) MG tablet Take 1 tablet (325 mg total) by mouth daily. (Patient not taking: Reported on 12/20/2023) 30 tablet 0   oxybutynin  (DITROPAN -XL) 10 MG 24 hr tablet Take 1 tablet (10 mg total) by mouth at bedtime. (Patient not taking: Reported on 12/20/2023) 30 tablet 5   polyethylene glycol powder (GLYCOLAX /MIRALAX ) 17 GM/SCOOP powder Mix 1 capful (17 g) into at least 4-8 ounces of water/juice and take by mouth daily. (Patient not taking: Reported on 12/20/2023) 510 g 1   sertraline  (ZOLOFT ) 25 MG tablet Take 1 tablet (25 mg total) by mouth daily for 21 days, THEN 2 tablets (50 mg total) daily. 40 tablet 0   No facility-administered medications prior to visit.     ROS Review of Systems  Constitutional:  Negative for activity change and appetite change.  HENT:  Negative for sinus pressure and sore throat.   Respiratory:  Negative  for chest tightness, shortness of breath and wheezing.   Cardiovascular:  Negative for chest pain and palpitations.  Gastrointestinal:  Negative for abdominal distention, abdominal pain and constipation.  Genitourinary: Negative.   Musculoskeletal:        See HPI  Psychiatric/Behavioral:  Negative for behavioral problems and dysphoric mood.     Objective:  BP 118/69   Pulse 94   Temp 98.2 F (36.8 C) (Oral)   Ht 5' 4 (1.626 m)   Wt 170 lb 3.2 oz (77.2 kg)   LMP 03/26/2017   SpO2 99%  BMI 29.21 kg/m      02/03/2024   10:43 AM 12/20/2023   10:59 AM 08/27/2023    8:20 AM  BP/Weight  Systolic BP 118 113 98  Diastolic BP 69 67 52  Wt. (Lbs) 170.2 170 160.4  BMI 29.21 kg/m2 29.18 kg/m2 27.53 kg/m2      Physical Exam Constitutional:      Appearance: She is well-developed.  Cardiovascular:     Rate and Rhythm: Normal rate.     Heart sounds: Normal heart sounds. No murmur heard. Pulmonary:     Effort: Pulmonary effort is normal.     Breath sounds: Normal breath sounds. No wheezing or rales.  Chest:     Chest wall: No tenderness.  Abdominal:     General: Bowel sounds are normal. There is no distension.     Palpations: Abdomen is soft. There is no mass.     Tenderness: There is no abdominal tenderness.  Musculoskeletal:        General: Normal range of motion.     Right lower leg: No edema.     Left lower leg: No edema.  Neurological:     Mental Status: She is alert and oriented to person, place, and time.  Psychiatric:        Mood and Affect: Mood normal.        Latest Ref Rng & Units 08/27/2023    9:15 AM 08/06/2023   12:22 PM 07/18/2023    9:03 AM  CMP  Glucose 70 - 99 mg/dL  99  875   BUN 6 - 24 mg/dL  11  14   Creatinine 9.42 - 1.00 mg/dL  9.03  9.16   Sodium 865 - 144 mmol/L  142  140   Potassium 3.5 - 5.2 mmol/L  5.1  3.5   Chloride 96 - 106 mmol/L  105  108   CO2 20 - 29 mmol/L  18  22   Calcium  8.7 - 10.2 mg/dL  9.6  8.7   Total Protein 6.0 - 8.5  g/dL 7.1   7.4   Total Bilirubin 0.0 - 1.2 mg/dL <9.7   0.4   Alkaline Phos 44 - 121 IU/L 83   71   AST 0 - 40 IU/L 10   12   ALT 0 - 32 IU/L 5   7     Lipid Panel     Component Value Date/Time   CHOL 126 08/27/2023 0916   TRIG 229 (H) 08/27/2023 0916   HDL 32 (L) 08/27/2023 0916   CHOLHDL 3.9 08/27/2023 0916   CHOLHDL 7.8 11/27/2015 0453   VLDL 66 (H) 11/27/2015 0453   LDLCALC 57 08/27/2023 0916    CBC    Component Value Date/Time   WBC 9.5 08/06/2023 1222   WBC 8.5 07/18/2023 0903   RBC 4.79 08/06/2023 1222   RBC 3.68 (L) 07/18/2023 0903   RBC 3.71 (L) 07/18/2023 0903   HGB 10.8 (L) 08/06/2023 1222   HCT 37.9 08/06/2023 1222   PLT 482 (H) 08/06/2023 1222   MCV 79 08/06/2023 1222   MCH 22.5 (L) 08/06/2023 1222   MCH 20.7 (L) 07/18/2023 0903   MCHC 28.5 (L) 08/06/2023 1222   MCHC 27.2 (L) 07/18/2023 0903   RDW 20.3 (H) 08/06/2023 1222   LYMPHSABS 1.9 08/01/2023 1208   MONOABS 0.7 10/23/2018 1406   EOSABS 0.2 08/01/2023 1208   BASOSABS 0.0 08/01/2023 1208    Lab Results  Component Value Date   HGBA1C  5.7 (H) 08/27/2023       Assessment & Plan Right-sided sciatica Symptoms improved with prednisone  but recurred after discontinuation. Willing to try physical therapy. - Refer to physical therapy for sciatica management.  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 Tylenol  ineffective, NSAIDs contraindicated due to Plavix . Voltaren  gel used previously. Aquatic therapy and water aerobics suggested. - Prescribe duloxetine  for pain management. - Order x-ray of the right shoulder to assess for potential severe pathology. - Consider referral to orthopedics for possible shoulder injection based on x-ray results. - Recommend aquatic therapy and water aerobics for joint pain.  Menopausal symptoms (hot flashes, insomnia) Significant menopausal symptoms including hot  flashes and insomnia. Previously on sertraline , which exacerbated hot flashes, and has since been discontinued.  Hypertension Well-controlled on lisinopril  10 mg daily. Blood pressure stable. Plan to taper off lisinopril  to avoid rebound hypertension. - Reduce lisinopril  dose to 5 mg daily and monitor blood pressure. - Schedule follow-up in 3 months to assess blood pressure and consider further reduction.  CAD/PAD Status post multiple interventional procedures Asymptomatic Secondary risk factor modification  Previous labs showed normal LDL but elevated triglycerides. - Refill rosuvastatin  prescription. - Order lipid panel to reassess triglyceride levels.  Smoking greater than 20-pack-year history Former smoker with over 20 years of smoking history. Recently quit smoking. Annual chest CT recommended for lung cancer screening due to smoking history. - Order annual chest CT for lung cancer screening due to smoking history.  General Health Maintenance Due for routine screenings including colonoscopy and mammogram. Prefers colonoscopy over stool test due to bleeding issues. - Order colonoscopy.       Meds ordered this encounter  Medications   DULoxetine  (CYMBALTA ) 60 MG capsule    Sig: Take 1 capsule (60 mg total) by mouth daily.    Dispense:  30 capsule    Refill:  3   lisinopril  (ZESTRIL ) 5 MG tablet    Sig: Take 1 tablet (5 mg total) by mouth daily.    Dispense:  90 tablet    Refill:  1   gabapentin  (NEURONTIN ) 300 MG capsule    Sig: Take 2 capsules (600 mg total) by mouth every morning AND 3 capsules (900 mg total) every evening.    Dispense:  150 capsule    Refill:  6   rosuvastatin  (CRESTOR ) 40 MG tablet    Sig: Take 1 tablet (40 mg total) by mouth daily.    Dispense:  90 tablet    Refill:  1    Follow-up: Return in about 6 months (around 08/03/2024) for Chronic medical conditions.       Corrina Sabin, MD, FAAFP. [facility]  [place], [place] [phone]   02/03/2024, 12:08 PM

## 2024-02-04 ENCOUNTER — Ambulatory Visit: Payer: Self-pay | Admitting: Family Medicine

## 2024-02-04 LAB — CMP14+EGFR
ALT: 8 IU/L (ref 0–32)
AST: 14 IU/L (ref 0–40)
Albumin: 4.3 g/dL (ref 3.8–4.9)
Alkaline Phosphatase: 71 IU/L (ref 49–135)
BUN/Creatinine Ratio: 10 (ref 9–23)
BUN: 12 mg/dL (ref 6–24)
Bilirubin Total: <0.2 mg/dL (ref 0.0–1.2)
CO2: 21 mmol/L (ref 20–29)
Calcium: 9.2 mg/dL (ref 8.7–10.2)
Chloride: 107 mmol/L — ABNORMAL HIGH (ref 96–106)
Creatinine, Ser: 1.21 mg/dL — ABNORMAL HIGH (ref 0.57–1.00)
Globulin, Total: 2.6 g/dL (ref 1.5–4.5)
Glucose: 96 mg/dL (ref 70–99)
Potassium: 4.9 mmol/L (ref 3.5–5.2)
Sodium: 141 mmol/L (ref 134–144)
Total Protein: 6.9 g/dL (ref 6.0–8.5)
eGFR: 53 mL/min/1.73 — ABNORMAL LOW (ref >59)

## 2024-02-04 LAB — LP+NON-HDL CHOLESTEROL
Cholesterol, Total: 136 mg/dL (ref 100–199)
HDL: 29 mg/dL — ABNORMAL LOW (ref >39)
LDL Chol Calc (NIH): 63 mg/dL (ref 0–99)
Total Non-HDL-Chol (LDL+VLDL): 107 mg/dL (ref 0–129)
Triglycerides: 276 mg/dL — ABNORMAL HIGH (ref 0–149)
VLDL Cholesterol Cal: 44 mg/dL — ABNORMAL HIGH (ref 5–40)

## 2024-02-05 MED FILL — Clopidogrel Bisulfate Tab 75 MG (Base Equiv): ORAL | 90 days supply | Qty: 90 | Fill #1 | Status: AC

## 2024-02-06 ENCOUNTER — Other Ambulatory Visit: Payer: Self-pay

## 2024-02-06 ENCOUNTER — Telehealth (HOSPITAL_COMMUNITY): Payer: Self-pay

## 2024-02-06 NOTE — Telephone Encounter (Signed)
 Called patient due to schedule change, to see if able to reschedule or change appointment time. I provided my direct contact number: [phone].

## 2024-02-14 ENCOUNTER — Other Ambulatory Visit: Payer: Self-pay

## 2024-02-17 ENCOUNTER — Encounter (HOSPITAL_COMMUNITY): Payer: Self-pay

## 2024-02-27 ENCOUNTER — Encounter (HOSPITAL_COMMUNITY)

## 2024-02-28 ENCOUNTER — Ambulatory Visit (HOSPITAL_COMMUNITY)
Admission: RE | Admit: 2024-02-28 | Discharge: 2024-02-28 | Disposition: A | Discharge status: Home / Self Care | Source: Ambulatory Visit | Attending: Cardiovascular Disease | Admitting: Cardiovascular Disease

## 2024-02-28 DIAGNOSIS — I739 Peripheral vascular disease, unspecified: Secondary | ICD-10-CM | POA: Diagnosis not present

## 2024-02-28 DIAGNOSIS — Z95820 Peripheral vascular angioplasty status with implants and grafts: Secondary | ICD-10-CM | POA: Insufficient documentation

## 2024-03-01 LAB — VAS US ABI WITH/WO TBI
Left ABI: 1
Right ABI: 1.19

## 2024-03-09 NOTE — Addendum Note (Signed)
 Addended by: ESTELLE OLAM GRADE on: 03/09/2024 09:54 AM   Modules accepted: Orders

## 2024-04-10 ENCOUNTER — Other Ambulatory Visit: Payer: Self-pay

## 2024-04-13 ENCOUNTER — Encounter: Payer: Self-pay | Admitting: Cardiovascular Disease

## 2024-04-13 ENCOUNTER — Telehealth: Payer: Self-pay | Admitting: Cardiovascular Disease

## 2024-04-13 NOTE — Telephone Encounter (Signed)
 Attempted to contact patient 3 times appointment per recall with no response from patient, recall expungement letter sent via MyChart, recall deleted

## 2024-05-01 ENCOUNTER — Other Ambulatory Visit: Payer: Self-pay

## 2024-05-01 ENCOUNTER — Other Ambulatory Visit: Payer: Self-pay | Admitting: Family Medicine

## 2024-05-01 MED ORDER — VEOZAH 45 MG PO TABS
45.0000 mg | ORAL_TABLET | Freq: Every day | ORAL | 0 refills | Status: AC
  Filled 2024-05-01: qty 90, 90d supply, fill #0

## 2024-05-04 ENCOUNTER — Other Ambulatory Visit: Payer: Self-pay

## 2024-05-07 ENCOUNTER — Other Ambulatory Visit: Payer: Self-pay

## 2024-05-15 ENCOUNTER — Other Ambulatory Visit: Payer: Self-pay

## 2024-05-15 MED FILL — Clopidogrel Bisulfate Tab 75 MG (Base Equiv): ORAL | 90 days supply | Qty: 90 | Fill #2 | Status: AC
